# Patient Record
Sex: Male | Born: 1955 | Race: White | Hispanic: No | Smoking: Never smoker
Health system: Southern US, Community
[De-identification: ages and names within clinical notes are randomized; demographics above are authoritative.]

## PROBLEM LIST (undated history)

## (undated) DIAGNOSIS — F419 Anxiety disorder, unspecified: Secondary | ICD-10-CM

## (undated) DIAGNOSIS — E119 Type 2 diabetes mellitus without complications: Secondary | ICD-10-CM

## (undated) DIAGNOSIS — E785 Hyperlipidemia, unspecified: Secondary | ICD-10-CM

## (undated) DIAGNOSIS — G4730 Sleep apnea, unspecified: Secondary | ICD-10-CM

## (undated) DIAGNOSIS — I10 Essential (primary) hypertension: Secondary | ICD-10-CM

## (undated) DIAGNOSIS — T7840XA Allergy, unspecified, initial encounter: Secondary | ICD-10-CM

## (undated) DIAGNOSIS — M1990 Unspecified osteoarthritis, unspecified site: Secondary | ICD-10-CM

## (undated) DIAGNOSIS — F32A Depression, unspecified: Secondary | ICD-10-CM

## (undated) DIAGNOSIS — K219 Gastro-esophageal reflux disease without esophagitis: Secondary | ICD-10-CM

## (undated) DIAGNOSIS — J45909 Unspecified asthma, uncomplicated: Secondary | ICD-10-CM

## (undated) DIAGNOSIS — C801 Malignant (primary) neoplasm, unspecified: Secondary | ICD-10-CM

## (undated) DIAGNOSIS — F329 Major depressive disorder, single episode, unspecified: Secondary | ICD-10-CM

## (undated) HISTORY — DX: Essential (primary) hypertension: I10

## (undated) HISTORY — DX: Anxiety disorder, unspecified: F41.9

## (undated) HISTORY — DX: Major depressive disorder, single episode, unspecified: F32.9

## (undated) HISTORY — DX: Unspecified osteoarthritis, unspecified site: M19.90

## (undated) HISTORY — DX: Malignant (primary) neoplasm, unspecified: C80.1

## (undated) HISTORY — DX: Allergy, unspecified, initial encounter: T78.40XA

## (undated) HISTORY — PX: OTHER SURGICAL HISTORY: SHX169

## (undated) HISTORY — DX: Sleep apnea, unspecified: G47.30

## (undated) HISTORY — DX: Type 2 diabetes mellitus without complications: E11.9

## (undated) HISTORY — DX: Unspecified asthma, uncomplicated: J45.909

## (undated) HISTORY — DX: Hyperlipidemia, unspecified: E78.5

## (undated) HISTORY — DX: Gastro-esophageal reflux disease without esophagitis: K21.9

## (undated) HISTORY — PX: TRIGGER FINGER RELEASE: SHX641

## (undated) HISTORY — PX: KNEE ARTHROSCOPY: SHX127

## (undated) HISTORY — PX: CARPAL BONE EXCISION: SHX6468

## (undated) HISTORY — DX: Depression, unspecified: F32.A

---
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
## 2007-04-08 ENCOUNTER — Ambulatory Visit (HOSPITAL_BASED_OUTPATIENT_CLINIC_OR_DEPARTMENT_OTHER): Admission: RE | Admit: 2007-04-08 | Discharge: 2007-04-08 | Payer: Self-pay | Admitting: Orthopedic Surgery

## 2008-01-06 ENCOUNTER — Ambulatory Visit (HOSPITAL_BASED_OUTPATIENT_CLINIC_OR_DEPARTMENT_OTHER): Admission: RE | Admit: 2008-01-06 | Discharge: 2008-01-06 | Payer: Self-pay | Admitting: Orthopedic Surgery

## 2008-01-28 ENCOUNTER — Encounter: Admission: RE | Admit: 2008-01-28 | Discharge: 2008-01-28 | Payer: Self-pay | Admitting: Orthopedic Surgery

## 2009-04-19 ENCOUNTER — Encounter (INDEPENDENT_AMBULATORY_CARE_PROVIDER_SITE_OTHER): Payer: Self-pay | Admitting: Orthopedic Surgery

## 2009-04-19 ENCOUNTER — Ambulatory Visit (HOSPITAL_BASED_OUTPATIENT_CLINIC_OR_DEPARTMENT_OTHER): Admission: RE | Admit: 2009-04-19 | Discharge: 2009-04-19 | Payer: Self-pay | Admitting: Orthopedic Surgery

## 2009-06-30 ENCOUNTER — Encounter: Admission: RE | Admit: 2009-06-30 | Discharge: 2009-06-30 | Payer: Self-pay | Admitting: Orthopedic Surgery

## 2009-08-11 ENCOUNTER — Ambulatory Visit (HOSPITAL_BASED_OUTPATIENT_CLINIC_OR_DEPARTMENT_OTHER): Admission: RE | Admit: 2009-08-11 | Discharge: 2009-08-11 | Payer: Self-pay | Admitting: Orthopedic Surgery

## 2010-10-16 LAB — POCT I-STAT, CHEM 8
Calcium, Ion: 1.14 mmol/L (ref 1.12–1.32)
Glucose, Bld: 104 mg/dL — ABNORMAL HIGH (ref 70–99)
HCT: 44 % (ref 39.0–52.0)
Hemoglobin: 15 g/dL (ref 13.0–17.0)
Potassium: 4 mEq/L (ref 3.5–5.1)

## 2010-10-16 LAB — GLUCOSE, CAPILLARY: Glucose-Capillary: 92 mg/dL (ref 70–99)

## 2010-11-04 LAB — BASIC METABOLIC PANEL
CO2: 26 mEq/L (ref 19–32)
Calcium: 9.5 mg/dL (ref 8.4–10.5)
Creatinine, Ser: 0.71 mg/dL (ref 0.4–1.5)
Glucose, Bld: 107 mg/dL — ABNORMAL HIGH (ref 70–99)

## 2010-12-13 NOTE — Op Note (Signed)
NAMETOMMY, GOOSTREE NO.:  [id number]   MEDICAL RECORD NO.:  [id number]          PATIENT TYPE:  AMB   LOCATION:  DSC                          FACILITY:  [facility]   PHYSICIAN:  Feliberto Gottron. Turner Daniels, M.D.   DATE OF BIRTH:  01/28/1956   DATE OF PROCEDURE:  04/08/2007  DATE OF DISCHARGE:                               OPERATIVE REPORT   PREOPERATIVE DIAGNOSIS:  1. Right carpal tunnel syndrome.  2. Right ring finger trigger.  3. Metacarpophalangeal joint arthritis, right.   POSTOPERATIVE DIAGNOSIS:  1. Right carpal tunnel syndrome.  2. Right ring finger trigger.  3. Metacarpophalangeal joint arthritis, right.   PROCEDURE:  1. Right carpal tunnel release.  2. Right ring finger trigger release.  3. Injection of cortisone into the base of the right thumb      metacarpophalangeal joint.   SURGEON:  Feliberto Gottron.  Turner Daniels, MD   FIRST ASSISTANT:  Laural Benes. Jannet Mantis.   ANESTHETIC:  Local with no sedation.   ESTIMATED BLOOD LOSS:  Minimal.   FLUID REPLACEMENT:  500 mL of crystalloid.   TOURNIQUET TIME:  20 minutes.   INDICATIONS FOR PROCEDURE:  A 55 year old man with severe right carpal  tunnel syndrome by EMG and MCV, a right ring finger trigger, and MCP  joint arthritis by x-ray.  This has failed conservative treatment; and  he desires elective right carpal tunnel release although that is  actually semi-urgent, because it is a severe right carpal tunnel  syndrome and elective trigger finger release, right ring finger and  injection of cortisone into the base of the MCP joint.   DESCRIPTION OF PROCEDURE:  The patient identified by armband and taken  to the operating room at the [facility] where the  appropriate anesthetic monitors were attached; and a simple IV line was  initiated.  A tourniquet was applied to the right forearm; and prior to  the prep and drape I went ahead and painted the hand with Betadine; and  then using a 10 mL syringe with a 50/50  mixture of 2% Xylocaine and 1/2%  Marcaine.  The local anesthesia was placed along the tract of the carpal  tunnel, and the right ring finger trigger.  The right hand was then  prepped and draped in the usual sterile fashion from the fingertips to  the tourniquet.  The limb wrapped with an Esmarch bandage, tourniquet  inflated to 300 mmHg and we began the procedure, by making a  longitudinal incision starting out at the base of the wrist flexion  crease; and going distally just to the ulnar side of the thenar crease  for a distance of about 3 or 4 cm.   We cut through the skin and subcutaneous tissue down to the palmar  fascia distally and made a small incision in the palmar fascia.  We  placed a Therapist, nutritional into the carpal tunnel, and then cut through the  transverse carpal ligament, performing the carpal tunnel release.  This  was extended up into the forearm with the black-handled scissors  completely exposing the  carpal tunnel.  There no ganglia or masses in  the carpal tunnel.  The median nerve was examined; and found to be  compressed as it passed under the transverse carpal ligament; and  pressure was off the nerve at this point.  We then directed our  attention to the ring finger trigger and made a 1.5 cm transverse  incision just distal to the MCP flexion crease of the ring finger  through the skin into the subcutaneous tissue; and with the tenotomy  scissors spread vertically exposing the A1 pulley.  Using an awl and  Senn retractors the pulley was completely exposed.  The neurovascular  bundles were protected and we removed a rectangular section of the A1  pulley, exposing the nodule on the tendon.  The patient then actively  flexed and extended the finger; and it no longer triggered.   These wounds were then washed out with normal saline solution; and on  the carpal tunnel small bleeders were cauterized with the bipolar.  We  then took a 3 mL syringe with 2 mL of  Celestone in it and a 0.625 x 25  needle and injected the MCP joint at the base of the thumb.  At this  point, the wounds were closed with running 5-0 nylon suture, dressing of  Xeroform, 4x4 dressing, sponges, 2-inch Webril, and an Ace wrap applied.  Tourniquet let down.  The patient was then taken to the recovery room  without difficulty.      Feliberto Gottron. Turner Daniels, M.D.  Electronically Signed     FJR/MEDQ  D:  04/08/2007  T:  04/08/2007  Job:  [id number]

## 2010-12-13 NOTE — Op Note (Signed)
[name], [name] NO.:  [id number]   MEDICAL RECORD NO.:  [id number]          PATIENT TYPE:  AMB   LOCATION:  DSC                          FACILITY:  [facility]   PHYSICIAN:  Steve Williams, M.D.   DATE OF BIRTH:  May 30, 1956   DATE OF PROCEDURE:  01/06/2008  DATE OF DISCHARGE:                               OPERATIVE REPORT   PREOPERATIVE DIAGNOSIS:  Left carpal tunnel syndrome.   POSTOPERATIVE DIAGNOSIS:  Left carpal tunnel syndrome.   PROCEDURE:  Left carpal tunnel release.   ANESTHETIC:  Local.   SURGEON:  Steve Gottron. Turner Daniels, MD.   FIRST ASSISTANT:  Shirl Harris, PA-C.   ESTIMATED BLOOD LOSS:  Minimal.   TOURNIQUET TIME:  About 6 minutes.   INDICATIONS FOR PROCEDURE:  A 55 year old man who underwent a successful  right carpal tunnel release last year, had severe left carpal tunnel  syndrome and desires same for his left side.  Risks and benefits of the  surgery are well known to the patient.   DESCRIPTION OF PROCEDURE:  Per the patient's request, he was taken to  the operating room 3 at [facility] and a local anesthetic  only, a 50/50 mixture of 0.5% Marcaine and 2% Xylocaine was then  administered, a total of 10 mL, going transversely across the wrist and  then just to the ulnar side of the thenar crease and the subcutaneous  tissue.  The left upper extremity was then prepped and draped in usual  sterile fashion from the fingertips to a forearm tourniquet.  Limb was  wrapped with an Esmarch bandage.  Tourniquet was inflated to 250 mmHg  and began the procedure by making a palmar longitudinal incision  starting at the wrist flexion crease going distally for 3.5 to 4 cm,  just to the ulnar side of the thenar crease by a couple of millimeters.  We cut through the skin and subcutaneous tissue, kept traction on the  skin medially and laterally with Senn retractors and cut down to the  transverse palmar fascia, which was incised and taken  proximally cutting  under tension into the carpal tunnel.  A Freer elevator was then placed  on the palmar aspect of the carpal tunnel, and we completed the carpal  tunnel release cutting down on the [place].  This was extended up in the  forearm fascia using black handled scissors completing the carpal tunnel  release.  We then probed the carpal tunnel with our fingers to make sure  there were no ganglia or masses, visualized the tendons and movement,  irrigated the wound out with normal saline solution, and then closed  with skin only with running subcuticular 4-0 Monocryl suture.  When we  ran it, we ran it from proximal to distal and  then back from distal to proximal, all with one suture to attain  basically a double closure.  A dressing of Xeroform, 4x4 dressing,  sponges, Webril, and Ace wrap was then applied and by the way the  tourniquet was let down prior to starting the closure but after the  irrigation.      Steve Williams, M.D.  Electronically Signed     FJR/MEDQ  D:  01/06/2008  T:  01/07/2008  Job:  [id number]

## 2011-01-09 ENCOUNTER — Other Ambulatory Visit: Payer: Self-pay | Admitting: Ophthalmology

## 2011-05-10 ENCOUNTER — Encounter: Payer: Self-pay | Admitting: Internal Medicine

## 2011-05-12 LAB — BASIC METABOLIC PANEL
BUN: 12
Calcium: 9.4
Creatinine, Ser: 0.74
GFR calc non Af Amer: >60
Glucose, Bld: 157 — ABNORMAL HIGH

## 2011-06-02 ENCOUNTER — Other Ambulatory Visit: Payer: Self-pay | Admitting: Internal Medicine

## 2013-02-12 ENCOUNTER — Encounter (INDEPENDENT_AMBULATORY_CARE_PROVIDER_SITE_OTHER): Payer: Self-pay | Admitting: *Deleted

## 2013-07-08 ENCOUNTER — Encounter (INDEPENDENT_AMBULATORY_CARE_PROVIDER_SITE_OTHER): Payer: Self-pay | Admitting: *Deleted

## 2015-06-21 ENCOUNTER — Encounter: Payer: Self-pay | Admitting: Family Medicine

## 2015-06-21 ENCOUNTER — Ambulatory Visit (INDEPENDENT_AMBULATORY_CARE_PROVIDER_SITE_OTHER): Payer: BC Managed Care – PPO | Admitting: Family Medicine

## 2015-06-21 VITALS — BP 150/80 | HR 66 | Temp 97.8°F | Ht 66.0 in | Wt 286.4 lb

## 2015-06-21 DIAGNOSIS — E785 Hyperlipidemia, unspecified: Secondary | ICD-10-CM | POA: Insufficient documentation

## 2015-06-21 DIAGNOSIS — E291 Testicular hypofunction: Secondary | ICD-10-CM | POA: Diagnosis not present

## 2015-06-21 DIAGNOSIS — E119 Type 2 diabetes mellitus without complications: Secondary | ICD-10-CM | POA: Insufficient documentation

## 2015-06-21 DIAGNOSIS — E1159 Type 2 diabetes mellitus with other circulatory complications: Secondary | ICD-10-CM | POA: Insufficient documentation

## 2015-06-21 DIAGNOSIS — F39 Unspecified mood [affective] disorder: Secondary | ICD-10-CM | POA: Diagnosis not present

## 2015-06-21 DIAGNOSIS — I10 Essential (primary) hypertension: Secondary | ICD-10-CM | POA: Diagnosis not present

## 2015-06-21 DIAGNOSIS — J309 Allergic rhinitis, unspecified: Secondary | ICD-10-CM | POA: Insufficient documentation

## 2015-06-21 DIAGNOSIS — E1169 Type 2 diabetes mellitus with other specified complication: Secondary | ICD-10-CM | POA: Insufficient documentation

## 2015-06-21 MED ORDER — TESTOSTERONE CYPIONATE 200 MG/ML IM SOLN
200.0000 mg | INTRAMUSCULAR | Status: DC
  Administered 2015-06-21 – 2015-10-05 (×5): 200 mg via INTRAMUSCULAR

## 2015-06-21 MED ORDER — LOSARTAN POTASSIUM-HCTZ 100-25 MG PO TABS: 1.0000 | ORAL_TABLET | Freq: Every day | ORAL | Status: DC

## 2015-06-21 NOTE — Progress Notes (Addendum)
   HPI  Patient presents today to establish care and discuss chronic medical problems.  [facility]. He says his practice is turning more pediatric and so he was looking for a more long-term medical practice.  He denies any problems today. Denies shortness of breath, dyspnea, palpitations, leg edema. He tolerates metformin easily. He has had angioedema from lisinopril but has tolerated losartan easily.  He's been on testosterone for 5+ years and is well adjusted to his dose. 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    PMH: Smoking status noted His past medical, surgical, social, family history reviewed and updated in EMR. ROS: Per HPI  Objective: BP 150/80 mmHg  Pulse 66  Temp(Src) 97.8 F (36.6 C) (Oral)  Ht 5\' 6"  (1.676 m)  Wt 286 lb 6.4 oz (129.91 kg)  BMI 46.25 kg/m2 Gen: NAD, alert, cooperative with exam HEENT: NCAT, TMs normal bilaterally, nares clear, oropharynx clear CV: RRR, good S1/S2, no murmur Resp: CTABL, no wheezes, non-labored Abd: SNTND, BS present, no guarding or organomegaly Ext: No edema, warm Neuro: Alert and oriented, No gross deficits  Physical exam to cluster cells plus pulses, sensation intact monofilament throughout, no lesion  Assessment and plan:  # Hypertension Elevated today, he suspects there is a component of whitecoat hypertension with a new practice, he states last visit it was 1:30 over 70s. No red flags. Refilled Hyzaar He reports labs just 3-4 months ago were normal.  # Diabetes Well controlled with A1c of 6.8. Metformin 1 g in the morning and 500 at night, continue Optho notes coming as well.   # Hypogonadism Long-standing problem, stable dose on testosterone, continue Alden maintenance Foot exam performed today as above Routine labs next visit or according to his records A1c next visit  sounds well controlled     Laroy Apple, MD [facility] 06/21/2015, 1:45 PM

## 2015-06-21 NOTE — Addendum Note (Signed)
Addended by: Nigel Berthold C on: 06/21/2015 02:09 PM   Modules accepted: Orders

## 2015-06-21 NOTE — Patient Instructions (Signed)
Great to meet you!  Come back in 3 months, please have your records sent to [place].  Please have your ophthalmology records sent as well.  We are hear any time, please dont hesitate to come in with concerns.

## 2015-07-13 ENCOUNTER — Ambulatory Visit (INDEPENDENT_AMBULATORY_CARE_PROVIDER_SITE_OTHER): Payer: BC Managed Care – PPO | Admitting: Family Medicine

## 2015-07-13 ENCOUNTER — Encounter: Payer: Self-pay | Admitting: Family Medicine

## 2015-07-13 VITALS — BP 142/83 | HR 75 | Temp 97.7°F | Ht 66.0 in | Wt 279.0 lb

## 2015-07-13 DIAGNOSIS — H6982 Other specified disorders of Eustachian tube, left ear: Secondary | ICD-10-CM

## 2015-07-13 DIAGNOSIS — H6980 Other specified disorders of Eustachian tube, unspecified ear: Secondary | ICD-10-CM | POA: Insufficient documentation

## 2015-07-13 DIAGNOSIS — Z85820 Personal history of malignant melanoma of skin: Secondary | ICD-10-CM | POA: Diagnosis not present

## 2015-07-13 DIAGNOSIS — L820 Inflamed seborrheic keratosis: Secondary | ICD-10-CM | POA: Insufficient documentation

## 2015-07-13 DIAGNOSIS — E291 Testicular hypofunction: Secondary | ICD-10-CM

## 2015-07-13 DIAGNOSIS — H6990 Unspecified Eustachian tube disorder, unspecified ear: Secondary | ICD-10-CM | POA: Insufficient documentation

## 2015-07-13 NOTE — Patient Instructions (Addendum)
Great to see you again!  Try Ayr nasal saline gel for your nosebleeds, continue flonase I would expect your ear pressure to resolve, or at least improve, in the next 3-4 days.   If you get worse or do not get better as expected please come back.   Lets get a visit for a skin check alone, with a history of melanoma its a good idea to focus on your skin consciously once a year.

## 2015-07-13 NOTE — Progress Notes (Signed)
   HPI  Patient presents today here today with upper complaints and a painful mole.  Patient explains that for the last 3 days he's had left ear pressure, nasal congestion, and intermittent bloody nose. He uses Flonase daily at baseline for his sinuses. He has no sinus pressure or pain No cough, dyspnea, chest pain, malaise, or fever. Describes a mild bloody nose that bleeds for less than 5 minutes a few times a day over the last 3-4 days.  Painful mole Has a history of melanoma and several dysplastic nevi. He has a long-standing mole in his mid back that is very tender and bothering him a lot lately. This has been going on for a few weeks.   PMH: Smoking status noted ROS: Per HPI  Objective: BP 142/83 mmHg  Pulse 75  Temp(Src) 97.7 F (36.5 C) (Oral)  Ht 5\' 6"  (1.676 m)  Wt 279 lb (126.554 kg)  BMI 45.05 kg/m2 Gen: NAD, alert, cooperative with exam HEENT: NCAT, TMs WNL, Nares clear, oropharynx clear CV: RRR, good S1/S2, no murmur Resp: CTABL, no wheezes, non-labored Ext: No edema, warm Neuro: Alert and oriented, No gross deficits  Skin  Several seborrheic keratosis scattered across his back 1 seborrheic keratosis and mid back with an erythematous base that is tender to the touch  This wound was frozen using liquid nitrogen with 3 freeze/thaw cycles   Assessment and plan:  # inflamed seborrheic keratosis Cryotherapy today Refer to dermatology with history of melanoma or skin checks, he's also had a few dysplastic nevi since that time  # Eustachian tube dysfunction Consider nosebleeds I recommended starting nasal saline gel Continue Flonase Expect to resolve within a few days, if worsens or does not improve as expected return to clinic  Still waiting for records so we have requested them formally today.   Laroy Apple, MD [facility] 07/13/2015, 11:44 AM

## 2015-07-19 ENCOUNTER — Ambulatory Visit: Payer: BC Managed Care – PPO

## 2015-08-10 ENCOUNTER — Ambulatory Visit (INDEPENDENT_AMBULATORY_CARE_PROVIDER_SITE_OTHER): Payer: BC Managed Care – PPO | Admitting: *Deleted

## 2015-08-10 DIAGNOSIS — E291 Testicular hypofunction: Secondary | ICD-10-CM

## 2015-08-10 NOTE — Progress Notes (Signed)
Testosterone injection given and tolerated well.  

## 2015-08-10 NOTE — Patient Instructions (Signed)

## 2015-08-11 ENCOUNTER — Other Ambulatory Visit: Payer: Self-pay | Admitting: *Deleted

## 2015-08-11 MED ORDER — MONTELUKAST SODIUM 10 MG PO TABS: 10.0000 mg | ORAL_TABLET | Freq: Every day | ORAL | Status: DC

## 2015-08-14 ENCOUNTER — Encounter: Payer: Self-pay | Admitting: Family Medicine

## 2015-08-14 ENCOUNTER — Ambulatory Visit (INDEPENDENT_AMBULATORY_CARE_PROVIDER_SITE_OTHER): Payer: BC Managed Care – PPO | Admitting: Family Medicine

## 2015-08-14 VITALS — BP 133/79 | HR 73 | Temp 98.5°F | Ht 66.0 in | Wt 285.2 lb

## 2015-08-14 DIAGNOSIS — J309 Allergic rhinitis, unspecified: Secondary | ICD-10-CM | POA: Diagnosis not present

## 2015-08-14 MED ORDER — METHYLPREDNISOLONE ACETATE 80 MG/ML IJ SUSP
80.0000 mg | Freq: Once | INTRAMUSCULAR | Status: AC
  Administered 2015-08-14: 80 mg via INTRAMUSCULAR

## 2015-08-14 MED ORDER — PREDNISONE 20 MG PO TABS: ORAL_TABLET | ORAL | Status: DC

## 2015-08-14 NOTE — Progress Notes (Signed)
BP 133/79 mmHg  Pulse 73  Temp(Src) 98.5 F (36.9 C) (Oral)  Ht 5\' 6"  (1.676 m)  Wt 285 lb 3.2 oz (129.366 kg)  BMI 46.05 kg/m2   Subjective:    Patient ID: Steve Williams, male    DOB: 1956-07-20, 60 y.o.   MRN: [id number]  HPI: Steve Williams is a 60 y.o. male presenting on 08/14/2015 for Sinusitis   HPI Sinus congestion and nasal drainage Patient has been having 3 days of sinus congestion and nasal drainage and postnasal drainage and pressure in his ears. He denies any fevers or chills or shortness of breath or wheezing. He does have a history of allergies and asthma and has been taking nasal steroid and Singulair. He was coming in early because he wants to see if he can get this before it fully flares up and goes to his lungs with his asthma. He currently is not having any chest congestion or wheezing or major symptoms of asthma. He does have a rescue inhaler but has not had to use it just yet. 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  Relevant past medical, surgical, family and social history reviewed and updated as indicated. Interim medical history since our last visit reviewed. Allergies and medications reviewed and updated.  Review of Systems  Constitutional: Negative for fever and chills.  HENT: Positive for postnasal drip, rhinorrhea, sinus pressure, sneezing and sore throat. Negative for congestion, ear discharge, ear pain and voice change.   Eyes: Negative for pain, discharge, redness and visual disturbance.  Respiratory: Positive for cough. Negative for shortness of breath and wheezing.   Cardiovascular: Negative for chest pain and leg swelling.  Gastrointestinal: Negative for abdominal pain, diarrhea and constipation.  Genitourinary: Negative for difficulty urinating.  Musculoskeletal: Negative for  back pain and gait problem.  Skin: Negative for rash.  Neurological: Negative for syncope, light-headedness and headaches.  All other systems reviewed and are negative.   Per HPI unless specifically indicated above     Medication List       This list is accurate as of: 08/14/15  9:51 AM.  Always use your most recent med list.               aspirin 81 MG tablet  Take 81 mg by mouth daily.     fluticasone 50 MCG/ACT nasal spray  Commonly known as:  FLONASE  Place into both nostrils daily.     losartan-hydrochlorothiazide 100-25 MG tablet  Commonly known as:  HYZAAR  Take 1 tablet by mouth daily.     metFORMIN 500 MG 24 hr tablet  Commonly known as:  GLUCOPHAGE-XR     montelukast 10 MG tablet  Commonly known as:  SINGULAIR  Take 1 tablet (10 mg total) by mouth at bedtime.     naproxen 500 MG tablet  Commonly known as:  NAPROSYN     predniSONE 20 MG tablet  Commonly known as:  DELTASONE  2 po at same time daily for 5 days     sertraline 100 MG tablet  Commonly known as:  ZOLOFT     simvastatin 40 MG tablet  Commonly known as:  ZOCOR     testosterone cypionate 200 MG/ML injection  Commonly known as:  DEPOTESTOSTERONE CYPIONATE  Inject 200 mg into  the muscle every 14 (fourteen) days.           Objective:    BP 133/79 mmHg  Pulse 73  Temp(Src) 98.5 F (36.9 C) (Oral)  Ht 5\' 6"  (1.676 m)  Wt 285 lb 3.2 oz (129.366 kg)  BMI 46.05 kg/m2  Wt Readings from Last 3 Encounters:  08/14/15 285 lb 3.2 oz (129.366 kg)  07/13/15 279 lb (126.554 kg)  06/21/15 286 lb 6.4 oz (129.91 kg)    Physical Exam  Constitutional: He is oriented to person, place, and time. He appears well-developed and well-nourished. No distress.  HENT:  Right Ear: Tympanic membrane, external ear and ear canal normal.  Left Ear: Tympanic membrane, external ear and ear canal normal.  Nose: Mucosal edema and rhinorrhea present. No sinus tenderness. No epistaxis. Right sinus exhibits maxillary  sinus tenderness. Right sinus exhibits no frontal sinus tenderness. Left sinus exhibits maxillary sinus tenderness. Left sinus exhibits no frontal sinus tenderness.  Mouth/Throat: Uvula is midline and mucous membranes are normal. Posterior oropharyngeal edema and posterior oropharyngeal erythema present. No oropharyngeal exudate or tonsillar abscesses.  Eyes: Conjunctivae and EOM are normal. Pupils are equal, round, and reactive to light. Right eye exhibits no discharge. No scleral icterus.  Cardiovascular: Normal rate, regular rhythm, normal heart sounds and intact distal pulses.   No murmur heard. Pulmonary/Chest: Effort normal and breath sounds normal. No respiratory distress. He has no wheezes.  Abdominal: He exhibits no distension.  Musculoskeletal: Normal range of motion. He exhibits no edema.  Neurological: He is alert and oriented to person, place, and time. Coordination normal.  Skin: Skin is warm and dry. No rash noted. He is not diaphoretic.  Psychiatric: He has a normal mood and affect. His behavior is normal.  Vitals reviewed.     Assessment & Plan:   Problem List Items Addressed This Visit      Respiratory   Allergic rhinitis - Primary    Patient has a history of allergies and is already on nasal steroid and antihistamine, will add steroid, if not improved by early next week may need antibiotic      Relevant Medications   methylPREDNISolone acetate (DEPO-MEDROL) injection 80 mg (Start on 08/14/2015 10:00 AM)   predniSONE (DELTASONE) 20 MG tablet       Follow up plan: Return if symptoms worsen or fail to improve.  Counseling provided for all of the vaccine components No orders of the defined types were placed in this encounter.    Caryl Pina, MD [facility] 08/14/2015, 9:51 AM

## 2015-08-14 NOTE — Assessment & Plan Note (Signed)
Patient has a history of allergies and is already on nasal steroid and antihistamine, will add steroid, if not improved by early next week may need antibiotic

## 2015-09-10 ENCOUNTER — Ambulatory Visit (INDEPENDENT_AMBULATORY_CARE_PROVIDER_SITE_OTHER): Payer: BC Managed Care – PPO

## 2015-09-10 DIAGNOSIS — E291 Testicular hypofunction: Secondary | ICD-10-CM | POA: Diagnosis not present

## 2015-09-16 ENCOUNTER — Other Ambulatory Visit: Payer: Self-pay

## 2015-09-16 MED ORDER — LOSARTAN POTASSIUM-HCTZ 100-25 MG PO TABS: 1.0000 | ORAL_TABLET | Freq: Every day | ORAL | Status: DC

## 2015-09-28 ENCOUNTER — Other Ambulatory Visit: Payer: BC Managed Care – PPO

## 2015-09-28 DIAGNOSIS — I10 Essential (primary) hypertension: Secondary | ICD-10-CM

## 2015-09-28 DIAGNOSIS — E291 Testicular hypofunction: Secondary | ICD-10-CM

## 2015-09-28 DIAGNOSIS — E785 Hyperlipidemia, unspecified: Secondary | ICD-10-CM

## 2015-09-29 LAB — CMP14+EGFR
ALBUMIN: 4.3 g/dL (ref 3.6–4.8)
ALK PHOS: 68 IU/L (ref 39–117)
ALT: 22 IU/L (ref 0–44)
AST: 16 IU/L (ref 0–40)
Albumin/Globulin Ratio: 2.3 (ref 1.1–2.5)
BILIRUBIN TOTAL: 0.5 mg/dL (ref 0.0–1.2)
BUN / CREAT RATIO: 20 (ref 10–22)
BUN: 15 mg/dL (ref 8–27)
CHLORIDE: 97 mmol/L (ref 96–106)
CO2: 23 mmol/L (ref 18–29)
CREATININE: 0.75 mg/dL — AB (ref 0.76–1.27)
Calcium: 9.1 mg/dL (ref 8.6–10.2)
GFR calc Af Amer: 114 mL/min/{1.73_m2} (ref >59)
GFR calc non Af Amer: 99 mL/min/{1.73_m2} (ref >59)
GLOBULIN, TOTAL: 1.9 g/dL (ref 1.5–4.5)
GLUCOSE: 123 mg/dL — AB (ref 65–99)
Potassium: 4 mmol/L (ref 3.5–5.2)
SODIUM: 138 mmol/L (ref 134–144)
Total Protein: 6.2 g/dL (ref 6.0–8.5)

## 2015-09-29 LAB — CBC WITH DIFFERENTIAL/PLATELET
Basophils Absolute: 0 10*3/uL (ref 0.0–0.2)
Basos: 1 %
EOS (ABSOLUTE): 0.1 10*3/uL (ref 0.0–0.4)
Eos: 2 %
HEMATOCRIT: 44.4 % (ref 37.5–51.0)
Hemoglobin: 16.2 g/dL (ref 12.6–17.7)
IMMATURE GRANULOCYTES: 1 %
Immature Grans (Abs): 0.1 10*3/uL (ref 0.0–0.1)
LYMPHS ABS: 1.9 10*3/uL (ref 0.7–3.1)
Lymphs: 26 %
MCH: 31.6 pg (ref 26.6–33.0)
MCHC: 36.5 g/dL — AB (ref 31.5–35.7)
MCV: 87 fL (ref 79–97)
MONOS ABS: 0.5 10*3/uL (ref 0.1–0.9)
Monocytes: 7 %
NEUTROS PCT: 63 %
Neutrophils Absolute: 4.7 10*3/uL (ref 1.4–7.0)
Platelets: 101 10*3/uL — ABNORMAL LOW (ref 150–379)
RBC: 5.12 x10E6/uL (ref 4.14–5.80)
RDW: 14 % (ref 12.3–15.4)
WBC: 7.3 10*3/uL (ref 3.4–10.8)

## 2015-09-29 LAB — LIPID PANEL
CHOLESTEROL TOTAL: 138 mg/dL (ref 100–199)
Chol/HDL Ratio: 3.3 ratio units (ref 0.0–5.0)
HDL: 42 mg/dL (ref >39)
LDL CALC: 50 mg/dL (ref 0–99)
TRIGLYCERIDES: 228 mg/dL — AB (ref 0–149)
VLDL Cholesterol Cal: 46 mg/dL — ABNORMAL HIGH (ref 5–40)

## 2015-09-29 LAB — TSH: TSH: 0.656 u[IU]/mL (ref 0.450–4.500)

## 2015-09-29 LAB — TESTOSTERONE,FREE AND TOTAL
TESTOSTERONE FREE: 4.3 pg/mL — AB (ref 6.6–18.1)
TESTOSTERONE: 89 ng/dL — AB (ref 348–1197)

## 2015-10-05 ENCOUNTER — Encounter: Payer: Self-pay | Admitting: Family Medicine

## 2015-10-05 ENCOUNTER — Ambulatory Visit (INDEPENDENT_AMBULATORY_CARE_PROVIDER_SITE_OTHER): Payer: BC Managed Care – PPO | Admitting: Family Medicine

## 2015-10-05 VITALS — BP 141/73 | HR 67 | Temp 97.1°F | Ht 66.0 in | Wt 284.6 lb

## 2015-10-05 DIAGNOSIS — E119 Type 2 diabetes mellitus without complications: Secondary | ICD-10-CM

## 2015-10-05 DIAGNOSIS — E291 Testicular hypofunction: Secondary | ICD-10-CM

## 2015-10-05 LAB — BAYER DCA HB A1C WAIVED: HB A1C (BAYER DCA - WAIVED): 6.3 % (ref <7.0)

## 2015-10-05 MED ORDER — TESTOSTERONE CYPIONATE 200 MG/ML IM SOLN: 400.0000 mg | INTRAMUSCULAR | Status: DC

## 2015-10-05 NOTE — Patient Instructions (Signed)
Great to see you!!  Come back in 3 months  We will call with your A1C

## 2015-10-05 NOTE — Progress Notes (Signed)
   HPI  Patient presents today  here for follow-up diabetes, hypertension, and hypogonadism.  Diabetes He's not checking his blood sugars He is watching his diet He exercises regularly at work, however no formal exercise No problems with metformin.  Hypertension No chest pain, dyspnea, palpitations, leg edema Good medication compliance.  Hypogonadism Getting testosterone as prescribed Does have intermittent issues with erectile dysfunction, energy is okay   PMH: Smoking status noted ROS: Per HPI  Objective: BP 141/73 mmHg  Pulse 67  Temp(Src) 97.1 F (36.2 C) (Oral)  Ht 5\' 6"  (1.676 m)  Wt 284 lb 9.6 oz (129.094 kg)  BMI 45.96 kg/m2 Gen: NAD, alert, cooperative with exam HEENT: NCAT, nares clear, TMs normal bilaterally  CV: RRR, good S1/S2, no murmur Resp: CTABL, no wheezes, non-labored Ext: No edema, warm Neuro: Alert and oriented, No gross deficits  Assessment and plan:  # Type 2 diabetes A1c 6.3, reasonable control with only metformin Continue metformin Encouraged diet and exercise  # Hypertension Borderline today, continue current medications including losartan and hydrochlorothiazide Labs review, normal renal function  # Hypogonadism Testosterone very low despite replacement Increase to 400 mg every month testosterone injection given today   Orders Placed This Encounter  Procedures  . Bayer DCA Hb A1c Waived    Meds ordered this encounter  Medications  . testosterone cypionate (DEPOTESTOSTERONE CYPIONATE) 200 MG/ML injection    Sig: Inject 2 mLs (400 mg total) into the muscle every 28 (twenty-eight) days.    Dispense:  10 mL    Refill:  [place], MD [facility] 10/05/2015, 8:28 AM

## 2015-10-08 ENCOUNTER — Ambulatory Visit: Payer: BC Managed Care – PPO

## 2015-10-11 ENCOUNTER — Ambulatory Visit: Payer: BC Managed Care – PPO

## 2015-10-15 ENCOUNTER — Telehealth: Payer: Self-pay

## 2015-10-15 DIAGNOSIS — E291 Testicular hypofunction: Secondary | ICD-10-CM

## 2015-10-15 NOTE — Telephone Encounter (Signed)
Has established Dx, but will re-check  Will repeat 8 am [place], MD [facility] 10/15/2015, 5:26 PM

## 2015-10-15 NOTE — Telephone Encounter (Signed)
Insurance denied Testosterone IM injection due to the patient not having two low Testosterone levels

## 2015-10-18 ENCOUNTER — Encounter: Payer: Self-pay | Admitting: Family Medicine

## 2015-10-21 ENCOUNTER — Telehealth: Payer: Self-pay

## 2015-10-21 NOTE — Telephone Encounter (Signed)
lmtcb

## 2015-10-22 ENCOUNTER — Ambulatory Visit: Payer: BC Managed Care – PPO

## 2015-10-22 DIAGNOSIS — E291 Testicular hypofunction: Secondary | ICD-10-CM

## 2015-10-23 LAB — TESTOSTERONE: Testosterone: 109 ng/dL — ABNORMAL LOW (ref 348–1197)

## 2015-10-26 ENCOUNTER — Telehealth: Payer: Self-pay

## 2015-10-26 NOTE — Telephone Encounter (Signed)
Detailed message left for patient.

## 2015-10-26 NOTE — Telephone Encounter (Signed)
Appreciate the Virgie Dad, MD [facility] 10/26/2015, 12:57 PM

## 2015-10-26 NOTE — Telephone Encounter (Signed)
insurAnce prior authorized Testosterone

## 2015-11-05 ENCOUNTER — Ambulatory Visit (INDEPENDENT_AMBULATORY_CARE_PROVIDER_SITE_OTHER): Payer: BC Managed Care – PPO | Admitting: *Deleted

## 2015-11-05 DIAGNOSIS — E291 Testicular hypofunction: Secondary | ICD-10-CM | POA: Diagnosis not present

## 2015-11-05 MED ORDER — TESTOSTERONE CYPIONATE 200 MG/ML IM SOLN
400.0000 mg | INTRAMUSCULAR | Status: DC
  Administered 2015-11-05 – 2016-05-08 (×7): 400 mg via INTRAMUSCULAR

## 2015-11-05 NOTE — Patient Instructions (Signed)

## 2015-11-05 NOTE — Progress Notes (Signed)
Testosterone injection given and patient tolerated well.  

## 2015-11-10 ENCOUNTER — Other Ambulatory Visit: Payer: Self-pay | Admitting: *Deleted

## 2015-11-11 MED ORDER — SIMVASTATIN 40 MG PO TABS: 40.0000 mg | ORAL_TABLET | Freq: Every day | ORAL | Status: DC

## 2015-12-06 ENCOUNTER — Ambulatory Visit (INDEPENDENT_AMBULATORY_CARE_PROVIDER_SITE_OTHER): Payer: BC Managed Care – PPO | Admitting: *Deleted

## 2015-12-06 DIAGNOSIS — E291 Testicular hypofunction: Secondary | ICD-10-CM

## 2015-12-06 NOTE — Progress Notes (Signed)
Pt given 400mg  testosterone injection IM RUOQ and tolerated well.

## 2015-12-06 NOTE — Patient Instructions (Signed)

## 2015-12-16 ENCOUNTER — Other Ambulatory Visit: Payer: Self-pay | Admitting: Family Medicine

## 2015-12-28 ENCOUNTER — Other Ambulatory Visit: Payer: BC Managed Care – PPO

## 2015-12-28 DIAGNOSIS — I10 Essential (primary) hypertension: Secondary | ICD-10-CM

## 2015-12-28 DIAGNOSIS — I159 Secondary hypertension, unspecified: Secondary | ICD-10-CM

## 2015-12-28 LAB — CBC WITH DIFFERENTIAL/PLATELET
BASOS ABS: 0.1 10*3/uL (ref 0.0–0.2)
BASOS: 1 %
EOS (ABSOLUTE): 0.2 10*3/uL (ref 0.0–0.4)
Eos: 2 %
Hematocrit: 47.6 % (ref 37.5–51.0)
Hemoglobin: 16.2 g/dL (ref 12.6–17.7)
IMMATURE GRANULOCYTES: 1 %
Immature Grans (Abs): 0 10*3/uL (ref 0.0–0.1)
Lymphocytes Absolute: 2.1 10*3/uL (ref 0.7–3.1)
Lymphs: 28 %
MCH: 30.8 pg (ref 26.6–33.0)
MCHC: 34 g/dL (ref 31.5–35.7)
MCV: 91 fL (ref 79–97)
MONOS ABS: 0.6 10*3/uL (ref 0.1–0.9)
Monocytes: 8 %
NEUTROS PCT: 60 %
Neutrophils Absolute: 4.5 10*3/uL (ref 1.4–7.0)
PLATELETS: 102 10*3/uL — AB (ref 150–379)
RBC: 5.26 x10E6/uL (ref 4.14–5.80)
RDW: 13.9 % (ref 12.3–15.4)
WBC: 7.4 10*3/uL (ref 3.4–10.8)

## 2015-12-28 LAB — CMP14+EGFR
ALBUMIN: 4.4 g/dL (ref 3.6–4.8)
ALK PHOS: 64 IU/L (ref 39–117)
ALT: 19 IU/L (ref 0–44)
AST: 20 IU/L (ref 0–40)
Albumin/Globulin Ratio: 3.1 — ABNORMAL HIGH (ref 1.2–2.2)
BILIRUBIN TOTAL: 0.5 mg/dL (ref 0.0–1.2)
BUN/Creatinine Ratio: 24 (ref 10–24)
BUN: 16 mg/dL (ref 8–27)
CO2: 23 mmol/L (ref 18–29)
CREATININE: 0.68 mg/dL — AB (ref 0.76–1.27)
Calcium: 9.3 mg/dL (ref 8.6–10.2)
Chloride: 98 mmol/L (ref 96–106)
GFR calc Af Amer: 119 mL/min/{1.73_m2} (ref >59)
GFR calc non Af Amer: 103 mL/min/{1.73_m2} (ref >59)
GLUCOSE: 121 mg/dL — AB (ref 65–99)
Globulin, Total: 1.4 g/dL — ABNORMAL LOW (ref 1.5–4.5)
Potassium: 4 mmol/L (ref 3.5–5.2)
SODIUM: 139 mmol/L (ref 134–144)
Total Protein: 5.8 g/dL — ABNORMAL LOW (ref 6.0–8.5)

## 2016-01-06 ENCOUNTER — Encounter: Payer: Self-pay | Admitting: Family Medicine

## 2016-01-06 ENCOUNTER — Ambulatory Visit (INDEPENDENT_AMBULATORY_CARE_PROVIDER_SITE_OTHER): Payer: BC Managed Care – PPO | Admitting: Family Medicine

## 2016-01-06 VITALS — BP 137/73 | HR 60 | Temp 97.1°F | Ht 66.0 in | Wt 288.8 lb

## 2016-01-06 DIAGNOSIS — M19041 Primary osteoarthritis, right hand: Secondary | ICD-10-CM

## 2016-01-06 DIAGNOSIS — E291 Testicular hypofunction: Secondary | ICD-10-CM | POA: Diagnosis not present

## 2016-01-06 DIAGNOSIS — E119 Type 2 diabetes mellitus without complications: Secondary | ICD-10-CM

## 2016-01-06 DIAGNOSIS — I10 Essential (primary) hypertension: Secondary | ICD-10-CM | POA: Diagnosis not present

## 2016-01-06 DIAGNOSIS — M19042 Primary osteoarthritis, left hand: Secondary | ICD-10-CM | POA: Diagnosis not present

## 2016-01-06 DIAGNOSIS — M19049 Primary osteoarthritis, unspecified hand: Secondary | ICD-10-CM | POA: Insufficient documentation

## 2016-01-06 LAB — BAYER DCA HB A1C WAIVED: HB A1C (BAYER DCA - WAIVED): 6.1 % (ref <7.0)

## 2016-01-06 NOTE — Patient Instructions (Addendum)
Great to see you!  You are due for an eye exam, I have written a referral, please have them send results to [place].

## 2016-01-06 NOTE — Progress Notes (Signed)
   HPI  Patient presents today follow-up.  Hypertension No problems with medications, good compliance. No chest pain, dyspnea, palpitations, leg edema.  Diabetes Not really watching his diet, get metformin compliance Planning to get an eye exam Note neuropathy.  Hand osteoarthritis Long-standing bilateral hand pain, previously seen by Dr. Renetta Chalk at [facility] ortho   PMH: Smoking status noted ROS: Per HPI  Objective: BP 150/82 mmHg  Pulse 60  Temp(Src) 97.1 F (36.2 C) (Oral)  Ht 5\' 6"  (1.676 m)  Wt 288 lb 12.8 oz (130.999 kg)  BMI 46.64 kg/m2 Gen: NAD, alert, cooperative with exam HEENT: NCAT CV: RRR, good S1/S2, no murmur Resp: CTABL, no wheezes, non-labored Ext: No edema, warm Neuro: Alert and oriented, No gross deficits  Diabetic Foot Exam - Simple   Simple Foot Form  Visual Inspection  No deformities, no ulcerations, no other skin breakdown bilaterally:  Yes  Sensation Testing  Intact to touch and monofilament testing bilaterally:  Yes  Pulse Check  Posterior Tibialis and Dorsalis pulse intact bilaterally:  Yes  Comments       Assessment and plan:  # Type 2 diabetes A1c pending Continue metformin   # Hypertension Elevated today, although borderline Continue losartan, hydrochlorothiazide  # Hypogonadism Testosterone injection given today Recheck levels in 6 months after increase  # Hand osteoarthritis Discussed decreasing NSAIDs burden, try 1 Aleve twice daily If he continues Naprosyn would recommend adding in Pepcid or Zantac twice daily with the pill Also trial ofTylenol     Orders Placed This Encounter  Procedures  . Bayer DCA Hb A1c Waived    No orders of the defined types were placed in this encounter.    Laroy Apple, MD [facility] 01/06/2016, 8:21 AM

## 2016-01-27 ENCOUNTER — Encounter: Payer: Self-pay | Admitting: Family Medicine

## 2016-01-27 ENCOUNTER — Telehealth: Payer: Self-pay | Admitting: Family Medicine

## 2016-01-27 DIAGNOSIS — Z9989 Dependence on other enabling machines and devices: Secondary | ICD-10-CM

## 2016-01-27 DIAGNOSIS — G4733 Obstructive sleep apnea (adult) (pediatric): Secondary | ICD-10-CM | POA: Insufficient documentation

## 2016-01-27 LAB — HM DIABETES EYE EXAM

## 2016-01-27 NOTE — Telephone Encounter (Signed)
Patient called very upset and discussed with our nurses that he needed CPAP Supplies.  He has obstructive sleep apnea, using CPAP for several years.  He later called back and apologized.   Laroy Apple, MD [facility] 01/27/2016, 2:29 PM

## 2016-02-03 ENCOUNTER — Ambulatory Visit (INDEPENDENT_AMBULATORY_CARE_PROVIDER_SITE_OTHER): Payer: BC Managed Care – PPO | Admitting: Family Medicine

## 2016-02-03 DIAGNOSIS — E291 Testicular hypofunction: Secondary | ICD-10-CM

## 2016-02-03 NOTE — Progress Notes (Signed)
Pt given testosterone inj Tolerated well 

## 2016-02-08 ENCOUNTER — Other Ambulatory Visit: Payer: Self-pay | Admitting: *Deleted

## 2016-02-10 ENCOUNTER — Other Ambulatory Visit: Payer: Self-pay | Admitting: *Deleted

## 2016-02-10 MED ORDER — SERTRALINE HCL 100 MG PO TABS
100.0000 mg | ORAL_TABLET | Freq: Every day | ORAL | Status: DC
Start: 2016-02-10 — End: 2016-08-07

## 2016-02-10 NOTE — Telephone Encounter (Signed)
No mention of depression in notes. We have never filled

## 2016-02-14 ENCOUNTER — Other Ambulatory Visit: Payer: Self-pay | Admitting: Family Medicine

## 2016-03-06 ENCOUNTER — Ambulatory Visit: Payer: BC Managed Care – PPO

## 2016-03-07 ENCOUNTER — Ambulatory Visit (INDEPENDENT_AMBULATORY_CARE_PROVIDER_SITE_OTHER): Payer: BC Managed Care – PPO

## 2016-03-07 DIAGNOSIS — E291 Testicular hypofunction: Secondary | ICD-10-CM | POA: Diagnosis not present

## 2016-03-07 NOTE — Progress Notes (Signed)
Testosterone injection, 400 mg, given to RUOQ, patient tolerated well.

## 2016-03-15 ENCOUNTER — Other Ambulatory Visit: Payer: Self-pay | Admitting: Family Medicine

## 2016-03-31 ENCOUNTER — Other Ambulatory Visit: Payer: BC Managed Care – PPO

## 2016-03-31 DIAGNOSIS — E785 Hyperlipidemia, unspecified: Secondary | ICD-10-CM

## 2016-03-31 DIAGNOSIS — I10 Essential (primary) hypertension: Secondary | ICD-10-CM

## 2016-03-31 DIAGNOSIS — R7989 Other specified abnormal findings of blood chemistry: Secondary | ICD-10-CM

## 2016-04-01 LAB — TESTOSTERONE: TESTOSTERONE: 120 ng/dL — AB (ref 264–916)

## 2016-04-01 LAB — LIPID PANEL
Chol/HDL Ratio: 3.6 ratio units (ref 0.0–5.0)
Cholesterol, Total: 135 mg/dL (ref 100–199)
HDL: 37 mg/dL — ABNORMAL LOW (ref >39)
LDL Calculated: 43 mg/dL (ref 0–99)
Triglycerides: 273 mg/dL — ABNORMAL HIGH (ref 0–149)
VLDL Cholesterol Cal: 55 mg/dL — ABNORMAL HIGH (ref 5–40)

## 2016-04-01 LAB — CMP14+EGFR
A/G RATIO: 2.2 (ref 1.2–2.2)
ALBUMIN: 4.4 g/dL (ref 3.6–4.8)
ALT: 21 IU/L (ref 0–44)
AST: 16 IU/L (ref 0–40)
Alkaline Phosphatase: 70 IU/L (ref 39–117)
BILIRUBIN TOTAL: 0.4 mg/dL (ref 0.0–1.2)
BUN / CREAT RATIO: 19 (ref 10–24)
BUN: 15 mg/dL (ref 8–27)
CHLORIDE: 98 mmol/L (ref 96–106)
CO2: 21 mmol/L (ref 18–29)
Calcium: 9.1 mg/dL (ref 8.6–10.2)
Creatinine, Ser: 0.79 mg/dL (ref 0.76–1.27)
GFR calc non Af Amer: 97 mL/min/{1.73_m2} (ref >59)
GFR, EST AFRICAN AMERICAN: 112 mL/min/{1.73_m2} (ref >59)
Globulin, Total: 2 g/dL (ref 1.5–4.5)
Glucose: 137 mg/dL — ABNORMAL HIGH (ref 65–99)
POTASSIUM: 3.9 mmol/L (ref 3.5–5.2)
Sodium: 138 mmol/L (ref 134–144)
TOTAL PROTEIN: 6.4 g/dL (ref 6.0–8.5)

## 2016-04-07 ENCOUNTER — Encounter: Payer: Self-pay | Admitting: Family Medicine

## 2016-04-07 ENCOUNTER — Ambulatory Visit (INDEPENDENT_AMBULATORY_CARE_PROVIDER_SITE_OTHER): Payer: BC Managed Care – PPO | Admitting: Family Medicine

## 2016-04-07 VITALS — BP 127/63 | HR 63 | Temp 97.7°F | Ht 66.0 in | Wt 284.2 lb

## 2016-04-07 DIAGNOSIS — E291 Testicular hypofunction: Secondary | ICD-10-CM

## 2016-04-07 DIAGNOSIS — I10 Essential (primary) hypertension: Secondary | ICD-10-CM

## 2016-04-07 DIAGNOSIS — E119 Type 2 diabetes mellitus without complications: Secondary | ICD-10-CM | POA: Diagnosis not present

## 2016-04-07 DIAGNOSIS — Z23 Encounter for immunization: Secondary | ICD-10-CM | POA: Diagnosis not present

## 2016-04-07 DIAGNOSIS — Z0000 Encounter for general adult medical examination without abnormal findings: Secondary | ICD-10-CM

## 2016-04-07 LAB — BAYER DCA HB A1C WAIVED: HB A1C (BAYER DCA - WAIVED): 6.2 % (ref <7.0)

## 2016-04-07 NOTE — Progress Notes (Signed)
   HPI  Patient presents today here for follow-up chronic medical conditions.  Diabetes Does not check blood sugar Good medication compliance No problems with metformin. Not watching his diet  Hypertension Good medication compliance No regular exercise however he's occupationally active.  Hypogonadism Good libido, good energy Numbers do not reflect how good he is doing clinically He would like to continue current regimen of once monthly injections  He is willing to get pneumonia vaccine today  patient works as an Clinical cytogeneticist  PMH: Smoking status noted ROS: Per HPI  Objective: BP 127/63   Pulse 63   Temp 97.7 F (36.5 C) (Oral)   Ht 5\' 6"  (1.676 m)   Wt 284 lb 3.2 oz (128.9 kg)   BMI 45.87 kg/m  Gen: NAD, alert, cooperative with exam HEENT: NCAT CV: RRR, good S1/S2, no murmur Resp: CTABL, no wheezes, non-labored Ext: No edema, warm Neuro: Alert and oriented, No gross deficits  Diabetic Foot Exam - Simple   Simple Foot Form Visual Inspection No deformities, no ulcerations, no other skin breakdown bilaterally:  Yes Sensation Testing Intact to touch and monofilament testing bilaterally:  Yes Pulse Check Posterior Tibialis and Dorsalis pulse intact bilaterally:  Yes Comments      Assessment and plan:  # Type 2 diabetes Previously well controlled, I expect the same today. A1c pending Continue metformin Recent creatinine is good Foot exam is normal  # Hypertension Well controlled Continue losartan/HCTZ  # Hypogonadism Testosterone level does not reflect the increase in dose 6 months ago I discussed this with the patient, he would like to continue as he feels clinically well   # healthCare maintenance Pneumovax given today    Orders Placed This Encounter  Procedures  . [facility] Hb A1c Carney Bern, MD [facility] 04/07/2016, 8:14 AM

## 2016-04-07 NOTE — Addendum Note (Signed)
Addended by: Karle Plumber on: 04/07/2016 08:47 AM   Modules accepted: Orders

## 2016-04-07 NOTE — Patient Instructions (Signed)
Great to see you!  Lets follow up in 4 months unless you need us sooner.    

## 2016-04-25 ENCOUNTER — Other Ambulatory Visit: Payer: Self-pay

## 2016-04-25 MED ORDER — METFORMIN HCL ER 500 MG PO TB24: 500.0000 mg | ORAL_TABLET | Freq: Every day | ORAL | 0 refills | Status: DC

## 2016-05-02 ENCOUNTER — Encounter: Payer: Self-pay | Admitting: Family Medicine

## 2016-05-04 MED ORDER — METFORMIN HCL ER 500 MG PO TB24: 1000.0000 mg | ORAL_TABLET | Freq: Every day | ORAL | 11 refills | Status: DC

## 2016-05-08 ENCOUNTER — Ambulatory Visit (INDEPENDENT_AMBULATORY_CARE_PROVIDER_SITE_OTHER): Payer: BC Managed Care – PPO | Admitting: *Deleted

## 2016-05-08 DIAGNOSIS — E291 Testicular hypofunction: Secondary | ICD-10-CM

## 2016-05-08 NOTE — Progress Notes (Signed)
Pt given Testosterone inj Tolerated well 

## 2016-05-12 ENCOUNTER — Other Ambulatory Visit: Payer: Self-pay | Admitting: *Deleted

## 2016-05-12 MED ORDER — TESTOSTERONE CYPIONATE 200 MG/ML IM SOLN: 400.0000 mg | INTRAMUSCULAR | 0 refills | Status: DC

## 2016-05-12 NOTE — Telephone Encounter (Signed)
Last filled 02/26/16. Last level drawn 03/31/16 and still low. Route to pool A for call in

## 2016-05-12 NOTE — Telephone Encounter (Signed)
Refilled Juana Diaz, MD [facility] 05/12/2016, 11:48 AM

## 2016-05-16 ENCOUNTER — Other Ambulatory Visit: Payer: Self-pay | Admitting: Family Medicine

## 2016-05-28 ENCOUNTER — Other Ambulatory Visit: Payer: Self-pay | Admitting: Family Medicine

## 2016-06-05 ENCOUNTER — Ambulatory Visit: Payer: BC Managed Care – PPO

## 2016-06-06 ENCOUNTER — Ambulatory Visit (INDEPENDENT_AMBULATORY_CARE_PROVIDER_SITE_OTHER): Payer: BC Managed Care – PPO | Admitting: *Deleted

## 2016-06-06 DIAGNOSIS — E291 Testicular hypofunction: Secondary | ICD-10-CM

## 2016-06-06 MED ORDER — TESTOSTERONE CYPIONATE 200 MG/ML IM SOLN
400.0000 mg | INTRAMUSCULAR | Status: DC
  Administered 2016-06-06 – 2017-01-08 (×6): 400 mg via INTRAMUSCULAR

## 2016-06-06 NOTE — Progress Notes (Signed)
Pt given Depo-testosterone 400mg  inj IM Tolerated well

## 2016-06-20 ENCOUNTER — Telehealth: Payer: Self-pay | Admitting: Family Medicine

## 2016-06-20 MED ORDER — METFORMIN HCL ER 500 MG PO TB24: 1000.0000 mg | ORAL_TABLET | Freq: Two times a day (BID) | ORAL | 11 refills | Status: DC

## 2016-06-20 NOTE — Telephone Encounter (Signed)
Patient aware.

## 2016-06-20 NOTE — Telephone Encounter (Signed)
RX sent  Laroy Apple, MD [facility] 06/20/2016, 11:47 AM

## 2016-06-20 NOTE — Telephone Encounter (Signed)
Patient reports he takes Metformin two tablets bid, chart lists once daily.  He would like a 90 day refill sent in for two bid, please advise.

## 2016-06-28 ENCOUNTER — Other Ambulatory Visit: Payer: Self-pay | Admitting: *Deleted

## 2016-06-29 MED ORDER — TESTOSTERONE CYPIONATE 200 MG/ML IM SOLN: 400.0000 mg | INTRAMUSCULAR | 0 refills | Status: DC

## 2016-06-30 NOTE — Telephone Encounter (Signed)
Phoned in.

## 2016-07-05 ENCOUNTER — Ambulatory Visit (INDEPENDENT_AMBULATORY_CARE_PROVIDER_SITE_OTHER): Payer: BC Managed Care – PPO | Admitting: *Deleted

## 2016-07-05 DIAGNOSIS — E291 Testicular hypofunction: Secondary | ICD-10-CM

## 2016-07-05 NOTE — Progress Notes (Signed)
Pt given Testosteron inj Tolerated well

## 2016-08-02 ENCOUNTER — Other Ambulatory Visit: Payer: BC Managed Care – PPO

## 2016-08-02 DIAGNOSIS — E785 Hyperlipidemia, unspecified: Secondary | ICD-10-CM

## 2016-08-02 DIAGNOSIS — E291 Testicular hypofunction: Secondary | ICD-10-CM

## 2016-08-02 DIAGNOSIS — I10 Essential (primary) hypertension: Secondary | ICD-10-CM

## 2016-08-02 DIAGNOSIS — E119 Type 2 diabetes mellitus without complications: Secondary | ICD-10-CM

## 2016-08-02 LAB — BAYER DCA HB A1C WAIVED: HB A1C: 5.9 % (ref <7.0)

## 2016-08-03 LAB — CMP14+EGFR
A/G RATIO: 1.8 (ref 1.2–2.2)
ALK PHOS: 70 IU/L (ref 39–117)
ALT: 23 IU/L (ref 0–44)
AST: 19 IU/L (ref 0–40)
Albumin: 4.3 g/dL (ref 3.6–4.8)
BILIRUBIN TOTAL: 0.6 mg/dL (ref 0.0–1.2)
BUN/Creatinine Ratio: 21 (ref 10–24)
BUN: 16 mg/dL (ref 8–27)
CHLORIDE: 98 mmol/L (ref 96–106)
CO2: 20 mmol/L (ref 18–29)
Calcium: 8.9 mg/dL (ref 8.6–10.2)
Creatinine, Ser: 0.77 mg/dL (ref 0.76–1.27)
GFR calc Af Amer: 113 mL/min/{1.73_m2} (ref >59)
GFR calc non Af Amer: 98 mL/min/{1.73_m2} (ref >59)
GLUCOSE: 135 mg/dL — AB (ref 65–99)
Globulin, Total: 2.4 g/dL (ref 1.5–4.5)
POTASSIUM: 3.8 mmol/L (ref 3.5–5.2)
Sodium: 139 mmol/L (ref 134–144)
TOTAL PROTEIN: 6.7 g/dL (ref 6.0–8.5)

## 2016-08-03 LAB — CBC WITH DIFFERENTIAL/PLATELET
BASOS ABS: 0.1 10*3/uL (ref 0.0–0.2)
BASOS: 1 %
EOS (ABSOLUTE): 0.1 10*3/uL (ref 0.0–0.4)
Eos: 1 %
Hematocrit: 48.5 % (ref 37.5–51.0)
Hemoglobin: 16.3 g/dL (ref 13.0–17.7)
IMMATURE GRANS (ABS): 0 10*3/uL (ref 0.0–0.1)
Immature Granulocytes: 0 %
LYMPHS: 23 %
Lymphocytes Absolute: 1.8 10*3/uL (ref 0.7–3.1)
MCH: 30.3 pg (ref 26.6–33.0)
MCHC: 33.6 g/dL (ref 31.5–35.7)
MCV: 90 fL (ref 79–97)
MONOS ABS: 0.5 10*3/uL (ref 0.1–0.9)
Monocytes: 7 %
NEUTROS ABS: 5.4 10*3/uL (ref 1.4–7.0)
NEUTROS PCT: 68 %
PLATELETS: 100 10*3/uL — AB (ref 150–379)
RBC: 5.38 x10E6/uL (ref 4.14–5.80)
RDW: 14.5 % (ref 12.3–15.4)
WBC: 8 10*3/uL (ref 3.4–10.8)

## 2016-08-03 LAB — TESTOSTERONE,FREE AND TOTAL
TESTOSTERONE: 75 ng/dL — AB (ref 264–916)
Testosterone, Free: 5.3 pg/mL — ABNORMAL LOW (ref 6.6–18.1)

## 2016-08-03 LAB — LIPID PANEL
CHOLESTEROL TOTAL: 139 mg/dL (ref 100–199)
Chol/HDL Ratio: 3.7 ratio units (ref 0.0–5.0)
HDL: 38 mg/dL — AB (ref >39)
LDL Calculated: 46 mg/dL (ref 0–99)
TRIGLYCERIDES: 274 mg/dL — AB (ref 0–149)
VLDL CHOLESTEROL CAL: 55 mg/dL — AB (ref 5–40)

## 2016-08-04 ENCOUNTER — Telehealth: Payer: Self-pay | Admitting: Family Medicine

## 2016-08-07 ENCOUNTER — Ambulatory Visit (INDEPENDENT_AMBULATORY_CARE_PROVIDER_SITE_OTHER): Payer: BC Managed Care – PPO | Admitting: Family Medicine

## 2016-08-07 ENCOUNTER — Other Ambulatory Visit: Payer: Self-pay | Admitting: Family Medicine

## 2016-08-07 ENCOUNTER — Encounter: Payer: Self-pay | Admitting: Family Medicine

## 2016-08-07 VITALS — BP 131/72 | HR 70 | Temp 97.9°F | Ht 66.0 in | Wt 273.2 lb

## 2016-08-07 DIAGNOSIS — E119 Type 2 diabetes mellitus without complications: Secondary | ICD-10-CM

## 2016-08-07 DIAGNOSIS — E291 Testicular hypofunction: Secondary | ICD-10-CM

## 2016-08-07 DIAGNOSIS — J0100 Acute maxillary sinusitis, unspecified: Secondary | ICD-10-CM

## 2016-08-07 DIAGNOSIS — N529 Male erectile dysfunction, unspecified: Secondary | ICD-10-CM

## 2016-08-07 MED ORDER — AMOXICILLIN-POT CLAVULANATE 875-125 MG PO TABS: 1.0000 | ORAL_TABLET | Freq: Two times a day (BID) | ORAL | 0 refills | Status: DC

## 2016-08-07 MED ORDER — SILDENAFIL CITRATE 20 MG PO TABS: ORAL_TABLET | ORAL | 3 refills | Status: DC

## 2016-08-07 MED ORDER — METFORMIN HCL ER 500 MG PO TB24: 500.0000 mg | ORAL_TABLET | Freq: Two times a day (BID) | ORAL | 11 refills | Status: DC

## 2016-08-07 MED ORDER — SERTRALINE HCL 100 MG PO TABS: 100.0000 mg | ORAL_TABLET | Freq: Every day | ORAL | 1 refills | Status: DC

## 2016-08-07 NOTE — Patient Instructions (Signed)
Great to see you!  We will keep an eye on your platelets  We will send you to endocrinology for testosterone deficiency.   Be sure to finish all antibiotics  Come back in 3 months   Sinusitis, Adult Sinusitis is soreness and inflammation of your sinuses. Sinuses are hollow spaces in the bones around your face. They are located:  Around your eyes.  In the middle of your forehead.  Behind your nose.  In your cheekbones. Your sinuses and nasal passages are lined with a stringy fluid (mucus). Mucus normally drains out of your sinuses. When your nasal tissues get inflamed or swollen, the mucus can get trapped or blocked so air cannot flow through your sinuses. This lets bacteria, viruses, and funguses grow, and that leads to infection. Follow these instructions at home: Medicines  Take, use, or apply over-the-counter and prescription medicines only as told by your doctor. These may include nasal sprays.  If you were prescribed an antibiotic medicine, take it as told by your doctor. Do not stop taking the antibiotic even if you start to feel better. Hydrate and Humidify  Drink enough water to keep your pee (urine) clear or pale yellow.  Use a cool mist humidifier to keep the humidity level in your home above 50%.  Breathe in steam for 10-15 minutes, 3-4 times a day or as told by your doctor. You can do this in the bathroom while a hot shower is running.  Try not to spend time in cool or dry air. Rest  Rest as much as possible.  Sleep with your head raised (elevated).  Make sure to get enough sleep each night. General instructions  Put a warm, moist washcloth on your face 3-4 times a day or as told by your doctor. This will help with discomfort.  Wash your hands often with soap and water. If there is no soap and water, use hand sanitizer.  Do not smoke. Avoid being around people who are smoking (secondhand smoke).  Keep all follow-up visits as told by your doctor. This is  important. Contact a doctor if:  You have a fever.  Your symptoms get worse.  Your symptoms do not get better within 10 days. Get help right away if:  You have a very bad headache.  You cannot stop throwing up (vomiting).  You have pain or swelling around your face or eyes.  You have trouble seeing.  You feel confused.  Your neck is stiff.  You have trouble breathing. This information is not intended to replace advice given to you by your health care provider. Make sure you discuss any questions you have with your health care provider. Document Released: 01/03/2008 Document Revised: 03/12/2016 Document Reviewed: 05/12/2015 Elsevier Interactive Patient Education  2017 [employer].

## 2016-08-07 NOTE — Progress Notes (Signed)
   HPI  Patient presents today here for follow up for chronic medical conditions.   T2DM  Doing well, has changed formulation of metformin recently with some loose stools.  No fluid numbness or tingling.  cold symptoms Patient with cough productive of green sputum over the last few days. He's had 2-3 weeks of maxillary pressure which has been helped by [facility], however it seems to be persistent and is now having worsening postnasal drip symptoms.  Erectile dysfunction Difficulty getting an erection, previously good helped with Viagra. Previously using samples.  Hypogonadism Patient has reduced sex drive, patient also has trouble with prolonged time before ejaculation. Good med compliance with testosterone.   Has fam Hx of hemochromatosis.   PMH: Smoking status noted ROS: Per HPI  Objective: BP 131/72   Pulse 70   Temp 97.9 F (36.6 C) (Oral)   Ht 5\' 6"  (1.676 m)   Wt 273 lb 3.2 oz (123.9 kg)   BMI 44.10 kg/m  Gen: NAD, alert, cooperative with exam HEENT: NCAT, bilateral maxillary pressure and tenderness to palpation, oropharynx clear CV: RRR, good S1/S2, no murmur Resp: CTABL, no wheezes, non-labored Ext: No edema, warm Neuro: Alert and oriented, No gross deficits  Diabetic Foot Exam - Simple   Simple Foot Form Visual Inspection No deformities, no ulcerations, no other skin breakdown bilaterally:  Yes Sensation Testing Intact to touch and monofilament testing bilaterally:  Yes Pulse Check Posterior Tibialis and Dorsalis pulse intact bilaterally:  Yes Comments      Assessment and plan:  # Type 2 diabetes Well-controlled Reduce metformin to 1 pill, 500 mg, twice daily Foot exam normal, no neuropathy  # Erectile dysfunction Trial of generic sildenafil Stable, likely worsened by hypogonadism  # Hypogonadism Difficult to control testosterone, referring to endocrinology, appreciate the recommendations and management  # Acute maxillary sinusitis Treat with  Augmentin, continue Flonase Reassuring physical exam, well appearing- non toxic  Meds ordered this encounter  Medications  . sertraline (ZOLOFT) 100 MG tablet    Sig: Take 1 tablet (100 mg total) by mouth daily.    Dispense:  90 tablet    Refill:  1  . sildenafil (REVATIO) 20 MG tablet    Sig: Take 1 to 5 pills once daily as needed for erectile dysfunction    Dispense:  30 tablet    Refill:  3  . amoxicillin-clavulanate (AUGMENTIN) 875-125 MG tablet    Sig: Take 1 tablet by mouth 2 (two) times daily.    Dispense:  20 tablet    Refill:  0  . metFORMIN (GLUCOPHAGE-XR) 500 MG 24 hr tablet    Sig: Take 1 tablet (500 mg total) by mouth 2 (two) times daily.    Dispense:  60 tablet    Refill:  [place], MD [facility] 08/07/2016, 8:31 AM

## 2016-08-07 NOTE — Telephone Encounter (Signed)
Was done a ov today

## 2016-08-09 ENCOUNTER — Other Ambulatory Visit: Payer: Self-pay | Admitting: Family Medicine

## 2016-10-11 ENCOUNTER — Encounter: Payer: Self-pay | Admitting: *Deleted

## 2016-10-21 ENCOUNTER — Other Ambulatory Visit: Payer: Self-pay | Admitting: Family Medicine

## 2016-10-25 ENCOUNTER — Other Ambulatory Visit: Payer: Self-pay | Admitting: Family Medicine

## 2016-10-26 ENCOUNTER — Other Ambulatory Visit: Payer: Self-pay

## 2016-10-26 MED ORDER — SERTRALINE HCL 100 MG PO TABS: 100.0000 mg | ORAL_TABLET | Freq: Every day | ORAL | 0 refills | Status: DC

## 2016-11-06 ENCOUNTER — Other Ambulatory Visit: Payer: BC Managed Care – PPO

## 2016-11-06 DIAGNOSIS — E291 Testicular hypofunction: Secondary | ICD-10-CM

## 2016-11-07 ENCOUNTER — Ambulatory Visit (INDEPENDENT_AMBULATORY_CARE_PROVIDER_SITE_OTHER): Payer: BC Managed Care – PPO | Admitting: Family Medicine

## 2016-11-07 ENCOUNTER — Encounter: Payer: Self-pay | Admitting: Family Medicine

## 2016-11-07 VITALS — BP 118/70 | HR 66 | Temp 97.1°F | Ht 66.0 in | Wt 274.8 lb

## 2016-11-07 DIAGNOSIS — N3941 Urge incontinence: Secondary | ICD-10-CM | POA: Diagnosis not present

## 2016-11-07 DIAGNOSIS — E119 Type 2 diabetes mellitus without complications: Secondary | ICD-10-CM

## 2016-11-07 DIAGNOSIS — M25512 Pain in left shoulder: Secondary | ICD-10-CM | POA: Diagnosis not present

## 2016-11-07 DIAGNOSIS — E291 Testicular hypofunction: Secondary | ICD-10-CM

## 2016-11-07 LAB — TESTOSTERONE,FREE AND TOTAL
Testosterone, Free: 3.8 pg/mL — ABNORMAL LOW (ref 6.6–18.1)
Testosterone: 44 ng/dL — ABNORMAL LOW (ref 264–916)

## 2016-11-07 LAB — BAYER DCA HB A1C WAIVED: HB A1C: 6.4 % (ref <7.0)

## 2016-11-07 NOTE — Patient Instructions (Signed)
Great to see you!  Come back in 3 months unless you need [place] sooner.   We will work on a urology appointment.

## 2016-11-07 NOTE — Progress Notes (Signed)
   HPI  Patient presents today here to follow-up for chronic medical conditions.  Shoulder pain Started after lifting around 100 rubber made buckets for work. Patient states he was lifting above his head No discrete injury, just deep seated left shoulder pain in the posterior anterior portion and lateral portion. She has taken some Aleve, also has taken 10 mg prednisone 2 or 3 times. Has also tried ice and heat. .  Diabetes Good medication compliance, only taking 500 mg metformin twice daily. Not really watching diet, he is occupationally active.  Testosterone- hypogonadism Patient has stopped testosterone injections, he has had some decreased libido. Energy is okay. complains also with intermittent urge incontinence. This has been going on for months, only occasionally. Described as having the urge to urinate, however not being able to hold it long enough to get there. No feelings of needing to go the restroom without actually needing to go No nocturia or weak stream.    PMH: Smoking status noted ROS: Per HPI  Objective: BP 118/70   Pulse 66   Temp 97.1 F (36.2 C) (Oral)   Ht 5\' 6"  (1.676 m)   Wt 274 lb 12.8 oz (124.6 kg)   BMI 44.35 kg/m  Gen: NAD, alert, cooperative with exam HEENT: NCAT CV: RRR, good S1/S2, no murmur Resp: CTABL, no wheezes, non-labored Ext: No edema, warm Neuro: Alert and oriented, No gross deficits  MSK No tenderness with empty can test or Hawkins test Mild tenderness to palpation over the bicipital groove on the left  Assessment and plan:  #  urge incontinence New complaint of urge incontinence, happening occasionally. No symptoms of bladder spasms Referring urology for their opinion, erectile dysfunction has improved with 20 mg sildenafil  # Hypogonadism After previous reduced testosterone level patient felt that the injection was likely not maintaining his testosterone, he stopped taking the medication and his testosterone is down to  44 today. The result has come in just after he left the clinic so this has not been discussed yet, there will be a phone note following. Will recommend restarting testosterone and following up with urology  # Type 2 diabetes Control slipping slightly, however he has reduced his metformin dose, A1c still controlled at 6.4 No changes  # Shoulder pain Likely biceps tendinitis Recommended rest, ice, supportive care.     Orders Placed This Encounter  Procedures  . Microalbumin / creatinine urine ratio  . Bayer DCA Hb A1c Waived  . Ambulatory referral to Urology    Referral Priority:   Routine    Referral Type:   Consultation    Referral Reason:   Specialty Services Required    Requested Specialty:   Urology    Number of Visits Requested:   [place], MD [place] 11/07/2016, 8:54 AM

## 2016-11-08 ENCOUNTER — Ambulatory Visit (INDEPENDENT_AMBULATORY_CARE_PROVIDER_SITE_OTHER): Payer: BC Managed Care – PPO | Admitting: *Deleted

## 2016-11-08 DIAGNOSIS — E291 Testicular hypofunction: Secondary | ICD-10-CM

## 2016-11-08 LAB — MICROALBUMIN / CREATININE URINE RATIO
CREATININE, UR: 117.9 mg/dL
MICROALBUM., U, RANDOM: 10.3 ug/mL
Microalb/Creat Ratio: 8.7 mg/g creat (ref 0.0–30.0)

## 2016-11-08 NOTE — Progress Notes (Signed)
Pt given Testosterone inj Tolerated well 

## 2016-11-09 ENCOUNTER — Ambulatory Visit: Payer: BC Managed Care – PPO | Admitting: Family Medicine

## 2016-11-15 ENCOUNTER — Encounter: Payer: Self-pay | Admitting: *Deleted

## 2016-11-22 ENCOUNTER — Other Ambulatory Visit: Payer: Self-pay | Admitting: *Deleted

## 2016-11-22 MED ORDER — SIMVASTATIN 40 MG PO TABS: 40.0000 mg | ORAL_TABLET | Freq: Every day | ORAL | 3 refills | Status: DC

## 2016-12-06 ENCOUNTER — Ambulatory Visit (INDEPENDENT_AMBULATORY_CARE_PROVIDER_SITE_OTHER): Payer: BC Managed Care – PPO | Admitting: *Deleted

## 2016-12-06 DIAGNOSIS — E291 Testicular hypofunction: Secondary | ICD-10-CM

## 2016-12-06 NOTE — Progress Notes (Signed)
Pt given Testosterone inj Tolerated well 

## 2017-01-05 ENCOUNTER — Other Ambulatory Visit: Payer: Self-pay | Admitting: Pediatrics

## 2017-01-05 DIAGNOSIS — E291 Testicular hypofunction: Secondary | ICD-10-CM

## 2017-01-05 MED ORDER — TESTOSTERONE CYPIONATE 200 MG/ML IM SOLN: 400.0000 mg | INTRAMUSCULAR | 0 refills | Status: DC

## 2017-01-08 ENCOUNTER — Ambulatory Visit (INDEPENDENT_AMBULATORY_CARE_PROVIDER_SITE_OTHER): Payer: BC Managed Care – PPO | Admitting: *Deleted

## 2017-01-08 DIAGNOSIS — E291 Testicular hypofunction: Secondary | ICD-10-CM

## 2017-01-08 NOTE — Progress Notes (Signed)
Pt given Testosterone inj Tolerated well 

## 2017-01-09 ENCOUNTER — Ambulatory Visit (INDEPENDENT_AMBULATORY_CARE_PROVIDER_SITE_OTHER): Payer: BC Managed Care – PPO | Admitting: Urology

## 2017-01-09 DIAGNOSIS — R3915 Urgency of urination: Secondary | ICD-10-CM | POA: Diagnosis not present

## 2017-01-09 DIAGNOSIS — N5201 Erectile dysfunction due to arterial insufficiency: Secondary | ICD-10-CM | POA: Diagnosis not present

## 2017-01-09 DIAGNOSIS — E291 Testicular hypofunction: Secondary | ICD-10-CM | POA: Diagnosis not present

## 2017-01-23 ENCOUNTER — Ambulatory Visit (INDEPENDENT_AMBULATORY_CARE_PROVIDER_SITE_OTHER): Payer: BC Managed Care – PPO | Admitting: Urology

## 2017-01-23 DIAGNOSIS — N5201 Erectile dysfunction due to arterial insufficiency: Secondary | ICD-10-CM

## 2017-01-23 DIAGNOSIS — E291 Testicular hypofunction: Secondary | ICD-10-CM

## 2017-01-24 ENCOUNTER — Other Ambulatory Visit: Payer: Self-pay | Admitting: Family Medicine

## 2017-01-25 ENCOUNTER — Ambulatory Visit: Payer: BC Managed Care – PPO | Admitting: Family Medicine

## 2017-02-07 ENCOUNTER — Ambulatory Visit (INDEPENDENT_AMBULATORY_CARE_PROVIDER_SITE_OTHER): Payer: BC Managed Care – PPO | Admitting: Family Medicine

## 2017-02-07 ENCOUNTER — Encounter: Payer: Self-pay | Admitting: Family Medicine

## 2017-02-07 VITALS — BP 131/78 | HR 72 | Temp 98.1°F | Ht 66.0 in | Wt 288.6 lb

## 2017-02-07 DIAGNOSIS — E119 Type 2 diabetes mellitus without complications: Secondary | ICD-10-CM | POA: Diagnosis not present

## 2017-02-07 DIAGNOSIS — G8929 Other chronic pain: Secondary | ICD-10-CM

## 2017-02-07 DIAGNOSIS — M5440 Lumbago with sciatica, unspecified side: Secondary | ICD-10-CM | POA: Diagnosis not present

## 2017-02-07 DIAGNOSIS — R229 Localized swelling, mass and lump, unspecified: Secondary | ICD-10-CM

## 2017-02-07 LAB — BAYER DCA HB A1C WAIVED: HB A1C (BAYER DCA - WAIVED): 6.6 % (ref <7.0)

## 2017-02-07 MED ORDER — GABAPENTIN 100 MG PO CAPS: 100.0000 mg | ORAL_CAPSULE | Freq: Three times a day (TID) | ORAL | 3 refills | Status: DC

## 2017-02-07 NOTE — Patient Instructions (Signed)
Great to see you!  Try gabapentin, 1 pill at night for 3 days, then twice daily for 3 days then 3 times daily.

## 2017-02-07 NOTE — Progress Notes (Signed)
   HPI  Patient presents today here for follow-up chronic medical conditions.  Type 2 diabetes Not checking blood sugars are watching diet. Good form and compliance and tolerance.  Skin nodule Right palm, present for 2-3 months, some tenderness. Patient has had carpal tunnel release and trigger finger addressed in the same hand.  performed by Dr. Mayer Camel at [facility] orthopedics  Chronic low back pain Midline low back pain radiating to bilateral paraspinal muscles and down bilateral posterior legs. Patient states it's fine at rest that hurts worse with activity. Has not tried any regular medications for this. He is interested in injections if they begin official, also he's interested in medications if they're available.  PMH: Smoking status noted ROS: Per HPI  Objective: BP 131/78   Pulse 72   Temp 98.1 F (36.7 C) (Oral)   Ht 5\' 6"  (1.676 m)   Wt 288 lb 9.6 oz (130.9 kg)   BMI 46.58 kg/m  Gen: NAD, alert, cooperative with exam HEENT: NCAT CV: RRR, good S1/S2, no murmur Resp: CTABL, no wheezes, non-labored Ext: No edema, warm Neuro: Alert and oriented, No gross deficits  Assessment and plan:  # Type 2 diabetes Clinically well controlled, A1c pending. For now continue current dose of metformin, discussed with patient weight loss could create possibility for stopping medication.  # Skin nodule All the home, likely ganglion cyst, however it's in a very unusual location. 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     Orders Placed This Encounter  Procedures  . Bayer DCA Hb A1c Waived    Meds ordered this encounter  Medications  . gabapentin (NEURONTIN) 100 MG capsule    Sig: Take 1 capsule (100 mg total) by mouth 3 (three) times daily.    Dispense:  90 capsule    Refill:  [place], MD [facility] 02/07/2017, 9:57 AM

## 2017-02-08 ENCOUNTER — Ambulatory Visit: Payer: BC Managed Care – PPO | Admitting: Family Medicine

## 2017-03-15 ENCOUNTER — Other Ambulatory Visit: Payer: Self-pay | Admitting: Family Medicine

## 2017-03-27 ENCOUNTER — Ambulatory Visit (INDEPENDENT_AMBULATORY_CARE_PROVIDER_SITE_OTHER): Payer: BC Managed Care – PPO | Admitting: Urology

## 2017-03-27 DIAGNOSIS — N5201 Erectile dysfunction due to arterial insufficiency: Secondary | ICD-10-CM

## 2017-03-27 DIAGNOSIS — E291 Testicular hypofunction: Secondary | ICD-10-CM

## 2017-04-15 ENCOUNTER — Other Ambulatory Visit: Payer: Self-pay | Admitting: Family Medicine

## 2017-04-29 ENCOUNTER — Other Ambulatory Visit: Payer: Self-pay | Admitting: Family Medicine

## 2017-05-11 ENCOUNTER — Ambulatory Visit: Payer: BC Managed Care – PPO | Admitting: Family Medicine

## 2017-05-17 ENCOUNTER — Ambulatory Visit (INDEPENDENT_AMBULATORY_CARE_PROVIDER_SITE_OTHER): Payer: BC Managed Care – PPO | Admitting: Family Medicine

## 2017-05-17 ENCOUNTER — Encounter: Payer: Self-pay | Admitting: Family Medicine

## 2017-05-17 VITALS — BP 136/79 | HR 76 | Temp 97.5°F | Ht 66.0 in | Wt 287.0 lb

## 2017-05-17 DIAGNOSIS — E119 Type 2 diabetes mellitus without complications: Secondary | ICD-10-CM | POA: Diagnosis not present

## 2017-05-17 DIAGNOSIS — H6983 Other specified disorders of Eustachian tube, bilateral: Secondary | ICD-10-CM | POA: Diagnosis not present

## 2017-05-17 DIAGNOSIS — R21 Rash and other nonspecific skin eruption: Secondary | ICD-10-CM | POA: Diagnosis not present

## 2017-05-17 DIAGNOSIS — L03011 Cellulitis of right finger: Secondary | ICD-10-CM

## 2017-05-17 DIAGNOSIS — Z23 Encounter for immunization: Secondary | ICD-10-CM | POA: Diagnosis not present

## 2017-05-17 LAB — CMP14+EGFR
A/G RATIO: 2.3 — AB (ref 1.2–2.2)
ALBUMIN: 4.3 g/dL (ref 3.6–4.8)
ALK PHOS: 69 IU/L (ref 39–117)
ALT: 27 IU/L (ref 0–44)
AST: 24 IU/L (ref 0–40)
BILIRUBIN TOTAL: 0.6 mg/dL (ref 0.0–1.2)
BUN / CREAT RATIO: 18 (ref 10–24)
BUN: 13 mg/dL (ref 8–27)
CHLORIDE: 98 mmol/L (ref 96–106)
CO2: 23 mmol/L (ref 20–29)
Calcium: 9 mg/dL (ref 8.6–10.2)
Creatinine, Ser: 0.72 mg/dL — ABNORMAL LOW (ref 0.76–1.27)
GFR calc Af Amer: 116 mL/min/{1.73_m2} (ref >59)
GFR calc non Af Amer: 100 mL/min/{1.73_m2} (ref >59)
GLOBULIN, TOTAL: 1.9 g/dL (ref 1.5–4.5)
Glucose: 160 mg/dL — ABNORMAL HIGH (ref 65–99)
POTASSIUM: 3.7 mmol/L (ref 3.5–5.2)
SODIUM: 136 mmol/L (ref 134–144)
Total Protein: 6.2 g/dL (ref 6.0–8.5)

## 2017-05-17 LAB — BAYER DCA HB A1C WAIVED: HB A1C (BAYER DCA - WAIVED): 6.9 % (ref <7.0)

## 2017-05-17 MED ORDER — CEPHALEXIN 500 MG PO CAPS: 500.0000 mg | ORAL_CAPSULE | Freq: Three times a day (TID) | ORAL | 0 refills | Status: DC

## 2017-05-17 MED ORDER — FLUCONAZOLE 150 MG PO TABS: ORAL_TABLET | ORAL | 0 refills | Status: DC

## 2017-05-17 MED ORDER — TRIAMCINOLONE ACETONIDE 0.5 % EX OINT: 1.0000 "application " | TOPICAL_OINTMENT | Freq: Two times a day (BID) | CUTANEOUS | 0 refills | Status: DC

## 2017-05-17 NOTE — Progress Notes (Signed)
   HPI  Patient presents today here to follow-up for diabetes and a couple of other questions.  Diabetes Doing well Good medication compliance, watching diet, very active, not checking blood sugars.  Rash Present for a few months, chest with red rash, bilateral hands with peeling and scaling.  Right thumbnail with chronic scaling, open fissure, and darkening of the nail, patient can express aggressively and have small amount of yellow fluid canal. No chronic hand with numbness.   PMH: Smoking status noted ROS: Per HPI  Objective: BP 136/79   Pulse 76   Temp (!) 97.5 F (36.4 C) (Oral)   Ht _0  (1.676 m)   Wt 287 lb (130.2 kg)   BMI 46.32 kg/m  Gen: NAD, alert, cooperative with exam HEENT: NCAT,TMs normal bilaterally with some heme crusted cerumen in the left, nares with some fresh blood and heme crusting in the right and boggy erythematous mucosa  CV: RRR, good S1/S2, no murmur Resp: CTABL, no wheezes, non-labored Ext: No edema, warm Neuro: Alert and oriented, No gross deficits   Skin Slightly erythematous rash on the chest with well demarcated areas of erythema and unaffected skin Right thumbnail with slight hyperpigmentation at the base, fissure on the medial cuticle with small amount of clear yellow fluid expressed with patient squeezing the base    Assessment and plan:  # Type 2 diabetes Previously well-controlled No changes, continue metformin.   # Rash Chest, likely tinea versicolor, Diflucan Patient also with scaling and peeling of the hands, consider psoriasis with his fingernail changes Recommended discussing with dermatology  # Chronic paronychia Right thumb, most likely diagnosis, some similar changes in other fingers as well without fissure. Keflex given yellow expression of fluid, triamcinolone  # Eustachian tube dysfunction Continue Singulair, continue Flonase, which he may need to hold for time given the fresh blood. Adding plain  Claritin   Orders Placed This Encounter  Procedures  . Bayer DCA Hb A1c Waived  . CMP14+EGFR    Meds ordered this encounter  Medications  . fluconazole (DIFLUCAN) 150 MG tablet    Sig: 1 pill once weekly    Dispense:  2 tablet    Refill:  0  . cephALEXin (KEFLEX) 500 MG capsule    Sig: Take 1 capsule (500 mg total) by mouth 3 (three) times daily.    Dispense:  21 capsule    Refill:  0  . triamcinolone ointment (KENALOG) 0.5 %    Sig: Apply 1 application topically 2 (two) times daily.    Dispense:  30 g    Refill:  0    Laroy Apple, MD [facility] 05/17/2017, 8:22 AM

## 2017-05-17 NOTE — Patient Instructions (Signed)
Great to see you!  For your finger:  Take all antibiotics Apply the steroid ointment 2 times daily for 1-2 weeks  For the rash on your chest- diflucan  For the eustachian tube dysfunction- try a plain claritin once daily ( not claritin D)

## 2017-06-12 ENCOUNTER — Other Ambulatory Visit: Payer: Self-pay | Admitting: Family Medicine

## 2017-06-27 ENCOUNTER — Telehealth: Payer: Self-pay | Admitting: Family Medicine

## 2017-06-27 ENCOUNTER — Ambulatory Visit: Payer: BC Managed Care – PPO | Admitting: Family Medicine

## 2017-06-27 ENCOUNTER — Encounter: Payer: Self-pay | Admitting: Family Medicine

## 2017-06-27 ENCOUNTER — Ambulatory Visit (INDEPENDENT_AMBULATORY_CARE_PROVIDER_SITE_OTHER): Payer: BC Managed Care – PPO

## 2017-06-27 VITALS — BP 134/70 | HR 109 | Temp 98.3°F | Ht 66.0 in | Wt 284.0 lb

## 2017-06-27 DIAGNOSIS — M25562 Pain in left knee: Secondary | ICD-10-CM

## 2017-06-27 NOTE — Telephone Encounter (Signed)
Appt made for today per patients request.  ?

## 2017-06-27 NOTE — Progress Notes (Signed)
Subjective:  Patient ID: Steve Williams, male    DOB: 1955-08-28  Age: 61 y.o. MRN: [id number]  CC: Knee Pain (pt here today c/o left knee pain and swelling x 3 days)   HPI [name] presents for 3 days of increasing pain in the left knee.  Of note is that he injured it originally in high school football at age 47.  He has had off and on problems ever since then.  7 years ago he had arthroscopy and was told by his orthopedist that he was a candidate for knee replacement.  He would like to have a knee injection tonight.  He has a patent an appointment with his orthopedist tomorrow.  He hopes to get relief with the injection to put off his knee replacement for a little bit longer. He has pain with all range of motion.  He has to me up to ambulate.  It is limiting his activities outside of ADLs and IADLs.  Pain is moderately severe at 6-8/10 for all range of motion.  It is interfering with sleep.  He took some of his wife's prednisone in order to get at least a little bit of sleep last night. Depression screen [facility] 2/9 06/27/2017 05/17/2017 02/07/2017  Decreased Interest 0 0 0  Down, Depressed, Hopeless 0 0 0  PHQ - 2 Score 0 0 0    History Dashiel has a past medical history of Allergy, Anxiety, Arthritis, Asthma, Cancer ([place]), Depression, Diabetes mellitus without complication ([place]), Hyperlipidemia, and Hypertension.   He has a past surgical history that includes Knee surgery (Left, 1974); Knee arthroscopy (Left); Carpal bone excision (Bilateral); Trigger finger release (Right); and lipoma removed.   His family history includes Cancer in his father; Diabetes in his father, mother, and sister; Heart disease in his father and mother; Hypertension in his mother and sister; Migraines in his son.He reports that  has never smoked. he has never used smokeless tobacco. He reports that he does not drink alcohol or use drugs.    ROS Review of Systems Noncontributory Objective:  BP 134/70   Pulse (!)  109   Temp 98.3 F (36.8 C) (Oral)   Ht 5\' 6"  (1.676 m)   Wt 284 lb (128.8 kg)   BMI 45.84 kg/m   BP Readings from Last 3 Encounters:  06/27/17 134/70  05/17/17 136/79  02/07/17 131/78    Wt Readings from Last 3 Encounters:  06/27/17 284 lb (128.8 kg)  05/17/17 287 lb (130.2 kg)  02/07/17 288 lb 9.6 oz (130.9 kg)     Physical Exam  Musculoskeletal:  The left knee has diminished l range of motion with moderate discomfort actively and passively.  Noted to have mild to moderate limp with his gait the joint lines are moderately tender r. The patella is palpable.  It is not ballotable but there is palpable arthritic deformity.  Lachman / anterior drawer signs are negative for signs of instability and pain free. McMurray testing reveals no pop or excessive discomfort. Varus and valgus stress maneuvers do not cause ligamentous stretch or instability.  However, they are quite uncomfortable    A steroid injection was performed at the lateral joint line using skin bleb of 2% lidocaine with epi.  No fluid could be obtained on aspiration attempt once the joint space was entered.  The joint space was injected Marcan 3 cc and 12 mg of Celestone. This was well tolerated.   Assessment & Plan:   Rylee was seen  today for knee pain.  Diagnoses and all orders for this visit:  Left knee pain, unspecified chronicity -     DG Knee 1-2 Views Left; Future       I am having Maudry Mayhew. [place] "Merry Proud" maintain his aspirin, fluticasone, sildenafil, montelukast, simvastatin, testosterone cypionate, metFORMIN, sertraline, fluconazole, cephALEXin, triamcinolone ointment, metFORMIN, and losartan-hydrochlorothiazide. We will continue to administer testosterone cypionate.  Allergies as of 06/27/2017      Reactions   Lisinopril    Erythromycin Rash      Medication List        Accurate as of 06/27/17  6:08 PM. Always use your most recent med list.          aspirin 81 MG tablet Take 81 mg by mouth  daily.   cephALEXin 500 MG capsule Commonly known as:  KEFLEX Take 1 capsule (500 mg total) by mouth 3 (three) times daily.   fluconazole 150 MG tablet Commonly known as:  DIFLUCAN 1 pill once weekly   fluticasone 50 MCG/ACT nasal spray Commonly known as:  FLONASE Place into both nostrils daily.   losartan-hydrochlorothiazide 100-25 MG tablet Commonly known as:  HYZAAR TAKE 1 TABLET DAILY   metFORMIN 500 MG 24 hr tablet Commonly known as:  GLUCOPHAGE-XR Take 1 tablet (500 mg total) by mouth 2 (two) times daily.   metFORMIN 500 MG 24 hr tablet Commonly known as:  GLUCOPHAGE-XR TAKE 1 TABLET TWICE A DAY   montelukast 10 MG tablet Commonly known as:  SINGULAIR TAKE 1 TABLET AT BEDTIME   sertraline 100 MG tablet Commonly known as:  ZOLOFT TAKE 1 TABLET DAILY   sildenafil 20 MG tablet Commonly known as:  REVATIO Take 1 to 5 pills once daily as needed for erectile dysfunction   simvastatin 40 MG tablet Commonly known as:  ZOCOR Take 1 tablet (40 mg total) by mouth daily.   testosterone cypionate 200 MG/ML injection Commonly known as:  DEPOTESTOSTERONE CYPIONATE Inject 2 mLs (400 mg total) into the muscle every 28 (twenty-eight) days.   triamcinolone ointment 0.5 % Commonly known as:  KENALOG Apply 1 application topically 2 (two) times daily.        Follow-up: Return if symptoms worsen or fail to improve.  Claretta Fraise, M.D.

## 2017-07-19 ENCOUNTER — Other Ambulatory Visit: Payer: Self-pay | Admitting: Family Medicine

## 2017-07-25 ENCOUNTER — Other Ambulatory Visit: Payer: Self-pay

## 2017-07-25 MED ORDER — SIMVASTATIN 40 MG PO TABS: 40.0000 mg | ORAL_TABLET | Freq: Every day | ORAL | 0 refills | Status: DC

## 2017-07-25 NOTE — Telephone Encounter (Signed)
Last lipid 08/02/16  Dr Wendi Snipes

## 2017-07-26 ENCOUNTER — Other Ambulatory Visit: Payer: Self-pay | Admitting: *Deleted

## 2017-08-18 ENCOUNTER — Other Ambulatory Visit: Payer: Self-pay | Admitting: Family Medicine

## 2017-08-20 ENCOUNTER — Ambulatory Visit: Payer: BC Managed Care – PPO | Admitting: Family Medicine

## 2017-08-20 ENCOUNTER — Encounter: Payer: Self-pay | Admitting: Family Medicine

## 2017-08-20 VITALS — BP 147/80 | HR 69 | Temp 97.2°F | Ht 66.0 in | Wt 283.2 lb

## 2017-08-20 DIAGNOSIS — E119 Type 2 diabetes mellitus without complications: Secondary | ICD-10-CM | POA: Diagnosis not present

## 2017-08-20 DIAGNOSIS — I10 Essential (primary) hypertension: Secondary | ICD-10-CM

## 2017-08-20 DIAGNOSIS — L02421 Furuncle of right axilla: Secondary | ICD-10-CM

## 2017-08-20 LAB — BAYER DCA HB A1C WAIVED: HB A1C (BAYER DCA - WAIVED): 7.8 % — ABNORMAL HIGH (ref <7.0)

## 2017-08-20 NOTE — Patient Instructions (Signed)
Great to see you!  Come back in 3 months unless you need us sooner.    

## 2017-08-20 NOTE — Progress Notes (Signed)
   HPI  Patient presents today for chronic medical conditions.  Type 2 diabetes Good medication compliance, not checking CBGs, watching diet moderately.  Hypertension No headache or chest pain.  Patient states that he is good to have a left knee replacement sometime likely in the next year, he would like to wait on EKG. He can walk up a flight of stairs without chest pain or shortness of breath, no history of cardiac disease.  Right axilla with swollen area that is improving.  It is still a little bit bothersome but nothing serious. No fever, chills, sweats. Was larger, more red, and more bothersome.  It is improving.  PMH: Smoking status noted ROS: Per HPI  Objective: BP (!) 147/80   Pulse 69   Temp (!) 97.2 F (36.2 C) (Oral)   Ht '5\' 6"'$  (1.676 m)   Wt 283 lb 3.2 oz (128.5 kg)   BMI 45.71 kg/m  Gen: NAD, alert, cooperative with exam HEENT: NCAT CV: RRR, good S1/S2, no murmur Resp: CTABL, no wheezes, non-labored Ext: No edema, warm Neuro: Alert and oriented, No gross deficits Skin Skin tags right axilla Erythematous slightly tender nodule the right axilla, approximately 5-8 mm in diameter  Assessment and plan:  #Type 2 diabetes Expect good control, previously very well controlled A1c pending Continue current medications including metformin  #Hypertension Borderline today, however reasonable most of the time Continue current medications including losartan/HCTZ  #Furuncle, right axilla Resolving, small and nontender Offered Keflex, he will take some leftover Keflex he has at home and call back if he has any problems. Offered mupirocin as well   Orders Placed This Encounter  Procedures  . Bayer DCA Hb A1c Waived  . CMP14+EGFR  . CBC with Differential/Platelet  . Lipid panel  . TSH     Laroy Apple, MD [facility] 08/20/2017, 8:12 AM

## 2017-08-21 LAB — CMP14+EGFR
ALT: 25 IU/L (ref 0–44)
AST: 17 IU/L (ref 0–40)
Albumin/Globulin Ratio: 2.3 — ABNORMAL HIGH (ref 1.2–2.2)
Albumin: 4.3 g/dL (ref 3.6–4.8)
Alkaline Phosphatase: 73 IU/L (ref 39–117)
BILIRUBIN TOTAL: 0.5 mg/dL (ref 0.0–1.2)
BUN/Creatinine Ratio: 14 (ref 10–24)
BUN: 11 mg/dL (ref 8–27)
CHLORIDE: 98 mmol/L (ref 96–106)
CO2: 23 mmol/L (ref 20–29)
Calcium: 9.3 mg/dL (ref 8.6–10.2)
Creatinine, Ser: 0.76 mg/dL (ref 0.76–1.27)
GFR calc Af Amer: 113 mL/min/{1.73_m2} (ref >59)
GFR calc non Af Amer: 98 mL/min/{1.73_m2} (ref >59)
GLUCOSE: 175 mg/dL — AB (ref 65–99)
Globulin, Total: 1.9 g/dL (ref 1.5–4.5)
Potassium: 3.8 mmol/L (ref 3.5–5.2)
Sodium: 138 mmol/L (ref 134–144)
TOTAL PROTEIN: 6.2 g/dL (ref 6.0–8.5)

## 2017-08-21 LAB — CBC WITH DIFFERENTIAL/PLATELET
BASOS ABS: 0.1 10*3/uL (ref 0.0–0.2)
Basos: 1 %
EOS (ABSOLUTE): 0.1 10*3/uL (ref 0.0–0.4)
EOS: 1 %
HEMATOCRIT: 45.4 % (ref 37.5–51.0)
HEMOGLOBIN: 16.8 g/dL (ref 13.0–17.7)
Immature Grans (Abs): 0.1 10*3/uL (ref 0.0–0.1)
Immature Granulocytes: 1 %
LYMPHS ABS: 2.1 10*3/uL (ref 0.7–3.1)
Lymphs: 28 %
MCH: 31.8 pg (ref 26.6–33.0)
MCHC: 37 g/dL — ABNORMAL HIGH (ref 31.5–35.7)
MCV: 86 fL (ref 79–97)
MONOCYTES: 7 %
Monocytes Absolute: 0.5 10*3/uL (ref 0.1–0.9)
NEUTROS ABS: 4.7 10*3/uL (ref 1.4–7.0)
Neutrophils: 62 %
Platelets: 103 10*3/uL — ABNORMAL LOW (ref 150–379)
RBC: 5.28 x10E6/uL (ref 4.14–5.80)
RDW: 14.2 % (ref 12.3–15.4)
WBC: 7.5 10*3/uL (ref 3.4–10.8)

## 2017-08-21 LAB — LIPID PANEL
CHOL/HDL RATIO: 4 ratio (ref 0.0–5.0)
Cholesterol, Total: 135 mg/dL (ref 100–199)
HDL: 34 mg/dL — AB (ref >39)
LDL CALC: 44 mg/dL (ref 0–99)
TRIGLYCERIDES: 287 mg/dL — AB (ref 0–149)
VLDL Cholesterol Cal: 57 mg/dL — ABNORMAL HIGH (ref 5–40)

## 2017-08-21 LAB — TSH: TSH: 0.756 u[IU]/mL (ref 0.450–4.500)

## 2017-09-09 ENCOUNTER — Other Ambulatory Visit: Payer: Self-pay | Admitting: Family Medicine

## 2017-09-12 ENCOUNTER — Other Ambulatory Visit: Payer: Self-pay | Admitting: Family Medicine

## 2017-10-05 ENCOUNTER — Encounter: Payer: Self-pay | Admitting: *Deleted

## 2017-10-08 ENCOUNTER — Other Ambulatory Visit: Payer: Self-pay | Admitting: Family Medicine

## 2017-10-30 ENCOUNTER — Other Ambulatory Visit: Payer: Self-pay | Admitting: Family Medicine

## 2017-10-30 ENCOUNTER — Encounter: Payer: Self-pay | Admitting: Family Medicine

## 2017-10-30 ENCOUNTER — Ambulatory Visit: Payer: BC Managed Care – PPO | Admitting: Family Medicine

## 2017-10-30 VITALS — BP 133/81 | HR 80 | Temp 97.5°F | Ht 66.0 in | Wt 271.0 lb

## 2017-10-30 DIAGNOSIS — Z9989 Dependence on other enabling machines and devices: Secondary | ICD-10-CM

## 2017-10-30 DIAGNOSIS — G4733 Obstructive sleep apnea (adult) (pediatric): Secondary | ICD-10-CM | POA: Diagnosis not present

## 2017-10-30 DIAGNOSIS — J0100 Acute maxillary sinusitis, unspecified: Secondary | ICD-10-CM

## 2017-10-30 LAB — VERITOR FLU A/B WAIVED
INFLUENZA A: NEGATIVE
INFLUENZA B: NEGATIVE

## 2017-10-30 MED ORDER — AMOXICILLIN-POT CLAVULANATE 875-125 MG PO TABS: 1.0000 | ORAL_TABLET | Freq: Two times a day (BID) | ORAL | 0 refills | Status: DC

## 2017-10-30 MED ORDER — METHYLPREDNISOLONE ACETATE 80 MG/ML IJ SUSP
80.0000 mg | Freq: Once | INTRAMUSCULAR | Status: AC
  Administered 2017-10-30: 80 mg via INTRAMUSCULAR

## 2017-10-30 NOTE — Progress Notes (Addendum)
   HPI  Patient presents today here for illness.  Patient explains that he had sudden onset illness about 2 days ago.  He states that his temperature was 99.1 last night this is very unusual for him.  He states that he is sneezed over 100 times in the last 24 hours.  He reports bilateral frontal and maxillary pain and pressure, nasal congestion, postnasal drip, frequent throat clearing.  He denies any shortness of breath, body aches, or chills.  His wife also had similar symptoms that came on strong and then resolved for her already. He has had a flu shot, she has not  PMH: Smoking status noted ROS: Per HPI  Objective: BP 133/81   Pulse 80   Temp (!) 97.5 F (36.4 C) (Oral)   Ht 5\' 6"  (1.676 m)   Wt 271 lb (122.9 kg)   BMI 43.74 kg/m  Gen: NAD, alert, cooperative with exam HEENT: NCAT, E tenderness to palpation of bilateral maxillary or frontal sinuses, nares with erythema and swollen boggy mucosa with enlarged turbinates, TMs normal bilaterally with some erythema developing on the right, oropharynx clear and moist CV: RRR, good S1/S2, no murmur Resp: CTABL, no wheezes, non-labored Abd: SNTND, BS present, no guarding or organomegaly Ext: No edema, warm Neuro: Alert and oriented, No gross deficits  Rapid flu: negative  Assessment and plan:  #Acute maxillary sinusitis Patient with severe sudden onset seasonal allergies and allergic rhinitis that I feel is leading to sinusitis. Treat with IM Depo-Medrol plus Augmentin   Orders Placed This Encounter  Procedures  . Veritor Flu A/B Waived    Order Specific Question:   Source    Answer:   nasopharynx    Meds ordered this encounter  Medications  . amoxicillin-clavulanate (AUGMENTIN) 875-125 MG tablet    Sig: Take 1 tablet by mouth 2 (two) times daily.    Dispense:  20 tablet    Refill:  0  . methylPREDNISolone acetate (DEPO-MEDROL) injection 80 mg    Laroy Apple, MD [facility] 10/30/2017,  9:02 AM  Patient has obstructive sleep apnea, he does well with his CPAP, however needs a new machine due to his old machine being greater than 56 years old and getting the message that the motor is past its usage time.  We will support her request for new machine.  Laroy Apple, MD [facility] 11/01/2017, 11:42 AM

## 2017-10-30 NOTE — Patient Instructions (Signed)
Great to see you!   Sinusitis, Adult Sinusitis is soreness and inflammation of your sinuses. Sinuses are hollow spaces in the bones around your face. They are located:  Around your eyes.  In the middle of your forehead.  Behind your nose.  In your cheekbones.  Your sinuses and nasal passages are lined with a stringy fluid (mucus). Mucus normally drains out of your sinuses. When your nasal tissues get inflamed or swollen, the mucus can get trapped or blocked so air cannot flow through your sinuses. This lets bacteria, viruses, and funguses grow, and that leads to infection. Follow these instructions at home: Medicines  Take, use, or apply over-the-counter and prescription medicines only as told by your doctor. These may include nasal sprays.  If you were prescribed an antibiotic medicine, take it as told by your doctor. Do not stop taking the antibiotic even if you start to feel better. Hydrate and Humidify  Drink enough water to keep your pee (urine) clear or pale yellow.  Use a cool mist humidifier to keep the humidity level in your home above 50%.  Breathe in steam for 10-15 minutes, 3-4 times a day or as told by your doctor. You can do this in the bathroom while a hot shower is running.  Try not to spend time in cool or dry air. Rest  Rest as much as possible.  Sleep with your head raised (elevated).  Make sure to get enough sleep each night. General instructions  Put a warm, moist washcloth on your face 3-4 times a day or as told by your doctor. This will help with discomfort.  Wash your hands often with soap and water. If there is no soap and water, use hand sanitizer.  Do not smoke. Avoid being around people who are smoking (secondhand smoke).  Keep all follow-up visits as told by your doctor. This is important. Contact a doctor if:  You have a fever.  Your symptoms get worse.  Your symptoms do not get better within 10 days. Get help right away if:  You  have a very bad headache.  You cannot stop throwing up (vomiting).  You have pain or swelling around your face or eyes.  You have trouble seeing.  You feel confused.  Your neck is stiff.  You have trouble breathing. This information is not intended to replace advice given to you by your health care provider. Make sure you discuss any questions you have with your health care provider. Document Released: 01/03/2008 Document Revised: 03/12/2016 Document Reviewed: 05/12/2015 Elsevier Interactive Patient Education  2018 Elsevier Inc.  

## 2017-11-22 ENCOUNTER — Ambulatory Visit: Payer: BC Managed Care – PPO | Admitting: Family Medicine

## 2017-11-27 ENCOUNTER — Ambulatory Visit: Payer: BC Managed Care – PPO | Admitting: Family Medicine

## 2017-11-27 ENCOUNTER — Encounter: Payer: Self-pay | Admitting: Family Medicine

## 2017-11-27 VITALS — BP 117/69 | HR 73 | Temp 97.5°F | Ht 66.0 in | Wt 270.0 lb

## 2017-11-27 DIAGNOSIS — E119 Type 2 diabetes mellitus without complications: Secondary | ICD-10-CM | POA: Diagnosis not present

## 2017-11-27 LAB — BAYER DCA HB A1C WAIVED: HB A1C (BAYER DCA - WAIVED): 5.7 % (ref <7.0)

## 2017-11-27 NOTE — Progress Notes (Signed)
   HPI  Patient presents today here for follow-up diabetes.  Patient feels well, he is watching his diet carefully. He was ill in the interim and received an injection of Depo-Medrol.  He tolerates metformin well and reports good medication compliance. He does not check his blood sugars  PMH: Smoking status noted ROS: Per HPI  Objective: BP 117/69   Pulse 73   Temp (!) 97.5 F (36.4 C) (Oral)   Ht 5\' 6"  (1.676 m)   Wt 270 lb (122.5 kg)   BMI 43.58 kg/m  Gen: NAD, alert, cooperative with exam HEENT: NCAT, EOMI, PERRL S1/S2, no murmur Resp: CTABL, no wheezes, non-labored Ext: No edema, warm Neuro: Alert and oriented, No gross deficits  Diabetic Foot Exam - Simple   Simple Foot Form Diabetic Foot exam was performed with the following findings:  Yes 11/27/2017  8:23 AM  Visual Inspection No deformities, no ulcerations, no other skin breakdown bilaterally:  Yes Sensation Testing Intact to touch and monofilament testing bilaterally:  Yes Pulse Check Posterior Tibialis and Dorsalis pulse intact bilaterally:  Yes Comments       Assessment and plan:  #Type 2 diabetes Expect good control, patient has lost some weight since his last visit and made positive lifestyle changes Continue metformin only, consider adding Januvia if additional medications are needed. A1c pending Urine microalbumin today as well   Pt sees urology for Hypogonadism  Orders Placed This Encounter  Procedures  . Microalbumin / creatinine urine ratio  . Bayer [facility] Hb A1c Carney Bern, MD [facility] 11/27/2017, 8:23 AM

## 2017-11-27 NOTE — Patient Instructions (Signed)
Great to see you!  See Dettinger in 3 months for follow up diabetes

## 2017-11-28 LAB — MICROALBUMIN / CREATININE URINE RATIO
Creatinine, Urine: 239.5 mg/dL
MICROALB/CREAT RATIO: 10.6 mg/g{creat} (ref 0.0–30.0)
Microalbumin, Urine: 25.5 ug/mL

## 2018-01-03 ENCOUNTER — Ambulatory Visit: Payer: BC Managed Care – PPO | Admitting: Physician Assistant

## 2018-01-03 ENCOUNTER — Encounter: Payer: Self-pay | Admitting: Physician Assistant

## 2018-01-03 VITALS — BP 125/71 | HR 86 | Temp 97.7°F | Ht 66.0 in | Wt 267.4 lb

## 2018-01-03 DIAGNOSIS — M25562 Pain in left knee: Secondary | ICD-10-CM | POA: Diagnosis not present

## 2018-01-03 NOTE — Progress Notes (Signed)
  Subjective:     Patient ID: Steve Williams, male   DOB: 10-27-1955, 62 y.o.   MRN: [id number]  HPI Pt with swelling and pain to the L knee Sig hx of same He has prev been told by Ortho that he will need replacement in the future Original injury at age 50 with open mensicus repair Intermit issue since by with sig sx over the last several years Last injection was in Nov of last year Pt felt a pop to the knee while walking several days ago and has noted increase swelling and pain since Wearing brace to help with sx  Review of Systems  Constitutional: Negative.   Musculoskeletal: Positive for arthralgias, gait problem and joint swelling. Negative for myalgias.  Skin: Negative.        Objective:   Physical Exam  Constitutional: He appears well-developed and well-nourished.  Nursing note and vitals reviewed. Using cane to aide in ambulation Wearing brace to the L knee + effusion noted to the L knee No ecchy Well healed medial and lateral joint surgical scars + TTP medial joint line FROM of the knee No apprec crepitus + lateral laxity noted McMurray/Lachman neg Due to large effusion offered joint asp/injection Pt consented Area cleansed with betadine Under sterile technique removed ~ 10cc of blood tinged fluid Then inject 2ccMarc/2cc Kenalog Area cleansed and dressed     Assessment:     1. Acute pain of left knee        Plan:     Wound care reviewed S/S of infection reviewed Given hx of diabetes informed may see sl spike in BS reading Brace prn If sx continue f.u with ortho

## 2018-01-03 NOTE — Patient Instructions (Signed)
Knee Effusion Knee effusion means that you have extra fluid in your knee. This can cause pain. Your knee may be more difficult to bend and move. Follow these instructions at home:  Use crutches as told by your doctor.  Wear a knee brace as told by your doctor.  Apply ice to the swollen area: ? Put ice in a plastic bag. ? Place a towel between your skin and the bag. ? Leave the ice on for 20 minutes, 2-3 times per day.  Keep your knee raised (elevated) when you are sitting or lying down.  Take medicines only as told by your doctor.  Do any rehabilitation or strengthening exercises as told by your doctor.  Rest your knee as told by your doctor. You may start doing your normal activities again when your doctor says it is okay.  Keep all follow-up visits as told by your doctor. This is important. Contact a doctor if:  You continue to have pain in your knee. Get help right away if:  You have increased swelling or redness of your knee.  You have severe pain in your knee.  You have a fever. This information is not intended to replace advice given to you by your health care provider. Make sure you discuss any questions you have with your health care provider. Document Released: 08/19/2010 Document Revised: 12/23/2015 Document Reviewed: 03/02/2014 Elsevier Interactive Patient Education  2018 Elsevier Inc.  

## 2018-01-15 ENCOUNTER — Other Ambulatory Visit: Payer: Self-pay | Admitting: Family Medicine

## 2018-01-21 ENCOUNTER — Other Ambulatory Visit: Payer: Self-pay | Admitting: Family Medicine

## 2018-02-04 ENCOUNTER — Other Ambulatory Visit: Payer: Self-pay | Admitting: Family Medicine

## 2018-02-27 ENCOUNTER — Ambulatory Visit: Payer: BC Managed Care – PPO | Admitting: Family Medicine

## 2018-02-27 ENCOUNTER — Encounter: Payer: Self-pay | Admitting: Family Medicine

## 2018-02-27 VITALS — BP 125/68 | HR 62 | Temp 97.7°F | Ht 66.0 in | Wt 261.0 lb

## 2018-02-27 DIAGNOSIS — F39 Unspecified mood [affective] disorder: Secondary | ICD-10-CM | POA: Diagnosis not present

## 2018-02-27 DIAGNOSIS — E291 Testicular hypofunction: Secondary | ICD-10-CM | POA: Diagnosis not present

## 2018-02-27 DIAGNOSIS — E1159 Type 2 diabetes mellitus with other circulatory complications: Secondary | ICD-10-CM | POA: Diagnosis not present

## 2018-02-27 DIAGNOSIS — E1169 Type 2 diabetes mellitus with other specified complication: Secondary | ICD-10-CM

## 2018-02-27 DIAGNOSIS — E785 Hyperlipidemia, unspecified: Secondary | ICD-10-CM

## 2018-02-27 DIAGNOSIS — I10 Essential (primary) hypertension: Secondary | ICD-10-CM

## 2018-02-27 DIAGNOSIS — L03011 Cellulitis of right finger: Secondary | ICD-10-CM

## 2018-02-27 LAB — BAYER DCA HB A1C WAIVED: HB A1C: 5.4 % (ref <7.0)

## 2018-02-27 MED ORDER — SIMVASTATIN 40 MG PO TABS: 40.0000 mg | ORAL_TABLET | Freq: Every day | ORAL | 3 refills | Status: DC

## 2018-02-27 MED ORDER — CEPHALEXIN 500 MG PO CAPS: 500.0000 mg | ORAL_CAPSULE | Freq: Four times a day (QID) | ORAL | 0 refills | Status: DC

## 2018-02-27 MED ORDER — LOSARTAN POTASSIUM-HCTZ 100-25 MG PO TABS: 1.0000 | ORAL_TABLET | Freq: Every day | ORAL | 3 refills | Status: DC

## 2018-02-27 MED ORDER — SERTRALINE HCL 100 MG PO TABS: 100.0000 mg | ORAL_TABLET | Freq: Every day | ORAL | 3 refills | Status: DC

## 2018-02-27 MED ORDER — MONTELUKAST SODIUM 10 MG PO TABS: 10.0000 mg | ORAL_TABLET | Freq: Every day | ORAL | 3 refills | Status: DC

## 2018-02-27 MED ORDER — SILDENAFIL CITRATE 20 MG PO TABS: ORAL_TABLET | ORAL | 5 refills | Status: DC

## 2018-02-27 NOTE — Progress Notes (Addendum)
BP 125/68   Pulse 62   Temp 97.7 F (36.5 C) (Oral)   Ht _0  (1.676 m)   Wt 261 lb (118.4 kg)   BMI 42.13 kg/m    Subjective:    Patient ID: Steve Williams, male    DOB: [birth year], 62 y.o.   MRN: [id number]  HPI: Steve Williams is a 62 y.o. male presenting on 02/27/2018 for Diabetes (3 month follow up) and Hypertension   HPI Type 2 diabetes mellitus Patient comes in today for recheck of his diabetes. Patient has been currently taking metformin. Patient is currently on an ACE inhibitor/ARB. Patient has not seen an ophthalmologist this year. Patient denies any issues with their feet.  Patient has been losing weight and if he continues then we will stop the metformin, his last A1c is 5.7, if similar then we will stop metformin today.  Hypertension Patient is currently on losartan-hydrochlorothiazide, and their blood pressure today is 125/68. Patient denies any lightheadedness or dizziness. Patient denies headaches, blurred vision, chest pains, shortness of breath, or weakness. Denies any side effects from medication and is content with current medication.  Will consider cutting back and just giving losartan without hydrochlorothiazide if patient continues to lose weight and do well.  Hyperlipidemia Patient is coming in for recheck of his hyperlipidemia. The patient is currently taking simvastatin. They deny any issues with myalgias or history of liver damage from it. They deny any focal numbness or weakness or chest pain.   Mood disorder Patient says today he is doing perfectly great he likes Zoloft and he likes continue with that and says that he has not had any issues since his been on the Zoloft and would like to continue it.  He denies any suicidal ideations or thoughts of hurting himself.  Depression screen [facility] 2/9 02/27/2018 11/27/2017 10/30/2017 08/20/2017 06/27/2017  Decreased Interest 0 0 0 0 0  Down, Depressed, Hopeless 0 0 0 0 0  PHQ - 2 Score 0 0 0 0 0    Patient has been  diagnosed with hypogonadism but he says he gets testosterone through his urologist and he follows up with them and he is going to call for an appointment with them.  Patient does have a hangnail infection on his right index finger that is been going on for about a month, he says he was biting on it and then it got infected and since then and has been swelling more, he did have some drainage out of it just last week.  He says it has been getting worse slowly.  Relevant past medical, surgical, family and social history reviewed and updated as indicated. Interim medical history since our last visit reviewed. Allergies and medications reviewed and updated.  Review of Systems  Constitutional: Negative for chills and fever.  Eyes: Negative for discharge.  Respiratory: Negative for shortness of breath and wheezing.   Cardiovascular: Negative for chest pain and leg swelling.  Musculoskeletal: Negative for back pain and gait problem.  Skin: Positive for color change. Negative for rash.  Neurological: Negative for dizziness, weakness, light-headedness and headaches.  All other systems reviewed and are negative.   Per HPI unless specifically indicated above   Allergies as of 02/27/2018      Reactions   Lisinopril    Erythromycin Rash      Medication List        Accurate as of 02/27/18  8:35 AM. Always use your most recent med list.  aspirin 81 MG tablet Take 81 mg by mouth daily.   fluticasone 50 MCG/ACT nasal spray Commonly known as:  FLONASE Place into both nostrils daily.   losartan-hydrochlorothiazide 100-25 MG tablet Commonly known as:  HYZAAR Take 1 tablet by mouth daily.   metFORMIN 500 MG 24 hr tablet Commonly known as:  GLUCOPHAGE-XR TAKE 1 TABLET TWICE A DAY   montelukast 10 MG tablet Commonly known as:  SINGULAIR Take 1 tablet (10 mg total) by mouth at bedtime.   sertraline 100 MG tablet Commonly known as:  ZOLOFT Take 1 tablet (100 mg total) by mouth  daily.   sildenafil 20 MG tablet Commonly known as:  REVATIO TAKE 1-5 PILLS BY MOUTH ONCE DAILY AS NEEDED FOR ERECTILE DYSFUNCTION   simvastatin 40 MG tablet Commonly known as:  ZOCOR Take 1 tablet (40 mg total) by mouth daily.   testosterone cypionate 200 MG/ML injection Commonly known as:  DEPOTESTOSTERONE CYPIONATE Inject 2 mLs (400 mg total) into the muscle every 28 (twenty-eight) days.          Objective:    BP 125/68   Pulse 62   Temp 97.7 F (36.5 C) (Oral)   Ht _0  (1.676 m)   Wt 261 lb (118.4 kg)   BMI 42.13 kg/m   Wt Readings from Last 3 Encounters:  02/27/18 261 lb (118.4 kg)  01/03/18 267 lb 6 oz (121.3 kg)  11/27/17 270 lb (122.5 kg)    Physical Exam  Constitutional: He is oriented to person, place, and time. He appears well-developed and well-nourished. No distress.  Eyes: Conjunctivae are normal. No scleral icterus.  Neck: Neck supple. No thyromegaly present.  Cardiovascular: Normal rate, regular rhythm, normal heart sounds and intact distal pulses.  No murmur heard. Pulmonary/Chest: Effort normal and breath sounds normal. No respiratory distress. He has no wheezes.  Musculoskeletal: Normal range of motion. He exhibits no edema.  Lymphadenopathy:    He has no cervical adenopathy.  Neurological: He is alert and oriented to person, place, and time. Coordination normal.  Skin: Skin is warm and dry. No rash noted. He is not diaphoretic. There is erythema (Erythema and slight swelling on the skin surrounding his right index finger nail).  Psychiatric: He has a normal mood and affect. His behavior is normal.  Nursing note and vitals reviewed.      Assessment & Plan:   Problem List Items Addressed This Visit      Cardiovascular and Mediastinum   Hypertension associated with diabetes ([place])   Relevant Medications   losartan-hydrochlorothiazide (HYZAAR) 100-25 MG tablet   sildenafil (REVATIO) 20 MG tablet   simvastatin (ZOCOR) 40 MG tablet      Endocrine   DM2 (diabetes mellitus, type 2) (HCC) - Primary   Relevant Medications   losartan-hydrochlorothiazide (HYZAAR) 100-25 MG tablet   simvastatin (ZOCOR) 40 MG tablet   Other Relevant Orders   Bayer DCA Hb A1c Waived   CMP14+EGFR   Hypogonadism in male   Relevant Orders   CBC with Differential/Platelet   Hyperlipidemia associated with type 2 diabetes mellitus (HCC)   Relevant Medications   losartan-hydrochlorothiazide (HYZAAR) 100-25 MG tablet   sildenafil (REVATIO) 20 MG tablet   simvastatin (ZOCOR) 40 MG tablet   Other Relevant Orders   Lipid panel     Other   Mood disorder ([place])   Morbid obesity ([place])    Other Visit Diagnoses    Paronychia of finger of right hand       Relevant Medications  cephALEXin (KEFLEX) 500 MG capsule       Follow up plan: Return in about 3 months (around 05/30/2018), or if symptoms worsen or fail to improve, for Diabetes and hypertension and cholesterol recheck.  Counseling provided for all of the vaccine components Orders Placed This Encounter  Procedures  . Bayer DCA Hb A1c Waived  . CBC with Differential/Platelet  . CMP14+EGFR  . Lipid panel    Caryl Pina, MD [facility] 02/27/2018, 8:35 AM

## 2018-02-27 NOTE — Addendum Note (Signed)
Addended by: Caryl Pina on: 02/27/2018 08:43 AM   Modules accepted: Orders

## 2018-02-28 LAB — CBC WITH DIFFERENTIAL/PLATELET
BASOS ABS: 0.1 10*3/uL (ref 0.0–0.2)
Basos: 1 %
EOS (ABSOLUTE): 0.1 10*3/uL (ref 0.0–0.4)
Eos: 1 %
HEMOGLOBIN: 17.3 g/dL (ref 13.0–17.7)
Hematocrit: 51.5 % — ABNORMAL HIGH (ref 37.5–51.0)
IMMATURE GRANULOCYTES: 0 %
Immature Grans (Abs): 0 10*3/uL (ref 0.0–0.1)
LYMPHS: 27 %
Lymphocytes Absolute: 1.9 10*3/uL (ref 0.7–3.1)
MCH: 30.1 pg (ref 26.6–33.0)
MCHC: 33.6 g/dL (ref 31.5–35.7)
MCV: 90 fL (ref 79–97)
MONOCYTES: 7 %
Monocytes Absolute: 0.5 10*3/uL (ref 0.1–0.9)
NEUTROS PCT: 64 %
Neutrophils Absolute: 4.5 10*3/uL (ref 1.4–7.0)
PLATELETS: 122 10*3/uL — AB (ref 150–450)
RBC: 5.75 x10E6/uL (ref 4.14–5.80)
RDW: 14 % (ref 12.3–15.4)
WBC: 7.1 10*3/uL (ref 3.4–10.8)

## 2018-02-28 LAB — CMP14+EGFR
ALBUMIN: 4.5 g/dL (ref 3.6–4.8)
ALT: 15 IU/L (ref 0–44)
AST: 12 IU/L (ref 0–40)
Albumin/Globulin Ratio: 2.3 — ABNORMAL HIGH (ref 1.2–2.2)
Alkaline Phosphatase: 72 IU/L (ref 39–117)
BUN / CREAT RATIO: 20 (ref 10–24)
BUN: 14 mg/dL (ref 8–27)
Bilirubin Total: 0.4 mg/dL (ref 0.0–1.2)
CHLORIDE: 101 mmol/L (ref 96–106)
CO2: 19 mmol/L — AB (ref 20–29)
CREATININE: 0.71 mg/dL — AB (ref 0.76–1.27)
Calcium: 9.4 mg/dL (ref 8.6–10.2)
GFR calc non Af Amer: 100 mL/min/{1.73_m2} (ref >59)
GFR, EST AFRICAN AMERICAN: 115 mL/min/{1.73_m2} (ref >59)
GLUCOSE: 119 mg/dL — AB (ref 65–99)
Globulin, Total: 2 g/dL (ref 1.5–4.5)
POTASSIUM: 3.8 mmol/L (ref 3.5–5.2)
Sodium: 141 mmol/L (ref 134–144)
TOTAL PROTEIN: 6.5 g/dL (ref 6.0–8.5)

## 2018-02-28 LAB — LIPID PANEL
CHOLESTEROL TOTAL: 117 mg/dL (ref 100–199)
Chol/HDL Ratio: 3.2 ratio (ref 0.0–5.0)
HDL: 37 mg/dL — AB (ref >39)
LDL Calculated: 47 mg/dL (ref 0–99)
Triglycerides: 167 mg/dL — ABNORMAL HIGH (ref 0–149)
VLDL Cholesterol Cal: 33 mg/dL (ref 5–40)

## 2018-04-02 ENCOUNTER — Ambulatory Visit: Payer: BC Managed Care – PPO | Admitting: Urology

## 2018-04-02 DIAGNOSIS — E291 Testicular hypofunction: Secondary | ICD-10-CM | POA: Diagnosis not present

## 2018-04-02 DIAGNOSIS — R3915 Urgency of urination: Secondary | ICD-10-CM

## 2018-04-02 DIAGNOSIS — N5201 Erectile dysfunction due to arterial insufficiency: Secondary | ICD-10-CM

## 2018-05-20 ENCOUNTER — Telehealth: Payer: Self-pay | Admitting: Family Medicine

## 2018-05-20 NOTE — Telephone Encounter (Signed)
Cathy or Dr. Warrick Parisian  have you seen anything from Dr. Glenetta Borg office about him giving the patient an anti inflammatory

## 2018-05-20 NOTE — Telephone Encounter (Signed)
I cannot say that I have or if I have I cannot recall.  Today recommend a specific anti-inflammatory or a topical or oral, does not know the name

## 2018-05-20 NOTE — Telephone Encounter (Signed)
Wants to speak about anitinflammotory med his chiroprator states he needs to be on.

## 2018-05-21 NOTE — Telephone Encounter (Signed)
Aware.  No documentation found from Dr. Bethann Goo.  Patient upset about that and says he guesses he will have to just suffer until appointment time.

## 2018-05-30 ENCOUNTER — Ambulatory Visit: Payer: BC Managed Care – PPO | Admitting: Family Medicine

## 2018-05-30 ENCOUNTER — Encounter: Payer: Self-pay | Admitting: Family Medicine

## 2018-05-30 VITALS — BP 126/68 | HR 64 | Temp 97.2°F | Ht 66.0 in | Wt 257.0 lb

## 2018-05-30 DIAGNOSIS — I152 Hypertension secondary to endocrine disorders: Secondary | ICD-10-CM

## 2018-05-30 DIAGNOSIS — E291 Testicular hypofunction: Secondary | ICD-10-CM

## 2018-05-30 DIAGNOSIS — I10 Essential (primary) hypertension: Secondary | ICD-10-CM

## 2018-05-30 DIAGNOSIS — E1169 Type 2 diabetes mellitus with other specified complication: Secondary | ICD-10-CM | POA: Diagnosis not present

## 2018-05-30 DIAGNOSIS — E1159 Type 2 diabetes mellitus with other circulatory complications: Secondary | ICD-10-CM | POA: Diagnosis not present

## 2018-05-30 DIAGNOSIS — E785 Hyperlipidemia, unspecified: Secondary | ICD-10-CM

## 2018-05-30 LAB — BAYER DCA HB A1C WAIVED: HB A1C (BAYER DCA - WAIVED): 5.6 % (ref <7.0)

## 2018-05-30 MED ORDER — TESTOSTERONE CYPIONATE 200 MG/ML IM SOLN: 400.0000 mg | INTRAMUSCULAR | 0 refills | Status: DC

## 2018-05-30 MED ORDER — MELOXICAM 15 MG PO TABS: 15.0000 mg | ORAL_TABLET | Freq: Every day | ORAL | 5 refills | Status: DC

## 2018-05-30 NOTE — Progress Notes (Signed)
BP 126/68   Pulse 64   Temp (!) 97.2 F (36.2 C) (Oral)   Ht 5' 6"  (1.676 m)   Wt 257 lb (116.6 kg)   BMI 41.48 kg/m    Subjective:    Patient ID: Steve Williams, male    DOB: 09/26/1955, 62 y.o.   MRN: [id number]  HPI: Steve Williams is a 62 y.o. male presenting on 05/30/2018 for Diabetes (3 month follow up)   HPI Type 2 diabetes mellitus Patient comes in today for recheck of his diabetes. Patient has been currently taking no medication and has been diet controlled and his last A1c was 5.4, he has stopped metformin at that time. Patient is currently on an ACE inhibitor/ARB. Patient has seen an ophthalmologist this year. Patient denies any issues with their feet.   Hypertension Patient is currently on losartan-hydrochlorothiazide, and their blood pressure today is 126/68. Patient denies any lightheadedness or dizziness. Patient denies headaches, blurred vision, chest pains, shortness of breath, or weakness. Denies any side effects from medication and is content with current medication.   Hyperlipidemia Patient is coming in for recheck of his hyperlipidemia. The patient is currently taking simvastatin. They deny any issues with myalgias or history of liver damage from it. They deny any focal numbness or weakness or chest pain.   Patient takes testosterone for hypogonadism and continues to do well on it and needs a refill for it.  He does not use it all the time.  Relevant past medical, surgical, family and social history reviewed and updated as indicated. Interim medical history since our last visit reviewed. Allergies and medications reviewed and updated.  Review of Systems  Constitutional: Negative for chills and fever.  Eyes: Negative for visual disturbance.  Respiratory: Negative for shortness of breath and wheezing.   Cardiovascular: Negative for chest pain and leg swelling.  Musculoskeletal: Negative for back pain and gait problem.  Skin: Negative for rash.  Neurological:  Negative for dizziness, weakness, light-headedness and numbness.  All other systems reviewed and are negative.   Per HPI unless specifically indicated above   Allergies as of 05/30/2018      Reactions   Lisinopril    Erythromycin Rash      Medication List        Accurate as of 05/30/18  8:27 AM. Always use your most recent med list.          aspirin 81 MG tablet Take 81 mg by mouth daily.   fluticasone 50 MCG/ACT nasal spray Commonly known as:  FLONASE Place into both nostrils daily.   losartan-hydrochlorothiazide 100-25 MG tablet Commonly known as:  HYZAAR Take 1 tablet by mouth daily.   meloxicam 15 MG tablet Commonly known as:  MOBIC Take 1 tablet (15 mg total) by mouth daily.   montelukast 10 MG tablet Commonly known as:  SINGULAIR Take 1 tablet (10 mg total) by mouth at bedtime.   sertraline 100 MG tablet Commonly known as:  ZOLOFT Take 1 tablet (100 mg total) by mouth daily.   sildenafil 20 MG tablet Commonly known as:  REVATIO TAKE 1-5 PILLS BY MOUTH ONCE DAILY AS NEEDED FOR ERECTILE DYSFUNCTION   simvastatin 40 MG tablet Commonly known as:  ZOCOR Take 1 tablet (40 mg total) by mouth daily.   testosterone cypionate 200 MG/ML injection Commonly known as:  DEPOTESTOSTERONE CYPIONATE Inject 2 mLs (400 mg total) into the muscle every 28 (twenty-eight) days.          Objective:  BP 126/68   Pulse 64   Temp (!) 97.2 F (36.2 C) (Oral)   Ht 5' 6"  (1.676 m)   Wt 257 lb (116.6 kg)   BMI 41.48 kg/m   Wt Readings from Last 3 Encounters:  05/30/18 257 lb (116.6 kg)  02/27/18 261 lb (118.4 kg)  01/03/18 267 lb 6 oz (121.3 kg)    Physical Exam  Constitutional: He is oriented to person, place, and time. He appears well-developed and well-nourished. No distress.  Eyes: Conjunctivae are normal. No scleral icterus.  Neck: Neck supple. No thyromegaly present.  Cardiovascular: Normal rate, regular rhythm, normal heart sounds and intact distal  pulses.  No murmur heard. Pulmonary/Chest: Effort normal and breath sounds normal. No respiratory distress. He has no wheezes.  Musculoskeletal: Normal range of motion. He exhibits no edema.  Lymphadenopathy:    He has no cervical adenopathy.  Neurological: He is alert and oriented to person, place, and time. Coordination normal.  Skin: Skin is warm and dry. No rash noted. He is not diaphoretic.  Psychiatric: He has a normal mood and affect. His behavior is normal.  Nursing note and vitals reviewed.   Results for orders placed or performed in visit on 02/27/18  Bayer DCA Hb A1c Waived  Result Value Ref Range   HB A1C (BAYER DCA - WAIVED) 5.4 <7.0 %  CBC with Differential/Platelet  Result Value Ref Range   WBC 7.1 3.4 - 10.8 x10E3/uL   RBC 5.75 4.14 - 5.80 x10E6/uL   Hemoglobin 17.3 13.0 - 17.7 g/dL   Hematocrit 51.5 (H) 37.5 - 51.0 %   MCV 90 79 - 97 fL   MCH 30.1 26.6 - 33.0 pg   MCHC 33.6 31.5 - 35.7 g/dL   RDW 14.0 12.3 - 15.4 %   Platelets 122 (L) 150 - 450 x10E3/uL   Neutrophils 64 Not Estab. %   Lymphs 27 Not Estab. %   Monocytes 7 Not Estab. %   Eos 1 Not Estab. %   Basos 1 Not Estab. %   Neutrophils Absolute 4.5 1.4 - 7.0 x10E3/uL   Lymphocytes Absolute 1.9 0.7 - 3.1 x10E3/uL   Monocytes Absolute 0.5 0.1 - 0.9 x10E3/uL   EOS (ABSOLUTE) 0.1 0.0 - 0.4 x10E3/uL   Basophils Absolute 0.1 0.0 - 0.2 x10E3/uL   Immature Granulocytes 0 Not Estab. %   Immature Grans (Abs) 0.0 0.0 - 0.1 x10E3/uL  CMP14+EGFR  Result Value Ref Range   Glucose 119 (H) 65 - 99 mg/dL   BUN 14 8 - 27 mg/dL   Creatinine, Ser 0.71 (L) 0.76 - 1.27 mg/dL   GFR calc non Af Amer 100 >59 mL/min/1.73   GFR calc Af Amer 115 >59 mL/min/1.73   BUN/Creatinine Ratio 20 10 - 24   Sodium 141 134 - 144 mmol/L   Potassium 3.8 3.5 - 5.2 mmol/L   Chloride 101 96 - 106 mmol/L   CO2 19 (L) 20 - 29 mmol/L   Calcium 9.4 8.6 - 10.2 mg/dL   Total Protein 6.5 6.0 - 8.5 g/dL   Albumin 4.5 3.6 - 4.8 g/dL   Globulin,  Total 2.0 1.5 - 4.5 g/dL   Albumin/Globulin Ratio 2.3 (H) 1.2 - 2.2   Bilirubin Total 0.4 0.0 - 1.2 mg/dL   Alkaline Phosphatase 72 39 - 117 IU/L   AST 12 0 - 40 IU/L   ALT 15 0 - 44 IU/L  Lipid panel  Result Value Ref Range   Cholesterol, Total 117 100 -  199 mg/dL   Triglycerides 167 (H) 0 - 149 mg/dL   HDL 37 (L) >39 mg/dL   VLDL Cholesterol Cal 33 5 - 40 mg/dL   LDL Calculated 47 0 - 99 mg/dL   Chol/HDL Ratio 3.2 0.0 - 5.0 ratio      Assessment & Plan:   Problem List Items Addressed This Visit      Cardiovascular and Mediastinum   Hypertension associated with diabetes ([place])     Endocrine   DM2 (diabetes mellitus, type 2) ([place]) - Primary   Relevant Orders   Bayer DCA Hb A1c Waived   Hypogonadism in male   Relevant Medications   testosterone cypionate (DEPOTESTOSTERONE CYPIONATE) 200 MG/ML injection   Hyperlipidemia associated with type 2 diabetes mellitus ([place])     Other   Morbid obesity ([place])       Follow up plan: Return if symptoms worsen or fail to improve, for Come back in 3 to 6 months depending on A1c result.  Counseling provided for all of the vaccine components Orders Placed This Encounter  Procedures  . Bayer [facility] Hb A1c Waived    Caryl Pina, MD [facility] 05/30/2018, 8:27 AM

## 2018-07-22 ENCOUNTER — Ambulatory Visit: Payer: BC Managed Care – PPO | Admitting: Family

## 2018-07-22 ENCOUNTER — Encounter: Payer: Self-pay | Admitting: Family

## 2018-07-22 VITALS — BP 126/71 | HR 75 | Ht 66.0 in | Wt 257.0 lb

## 2018-07-22 DIAGNOSIS — J209 Acute bronchitis, unspecified: Secondary | ICD-10-CM | POA: Diagnosis not present

## 2018-07-22 MED ORDER — METHYLPREDNISOLONE ACETATE 80 MG/ML IJ SUSP
80.0000 mg | Freq: Once | INTRAMUSCULAR | Status: AC
Start: 2018-07-22 — End: 2018-07-22
  Administered 2018-07-22: 80 mg via INTRAMUSCULAR

## 2018-07-22 MED ORDER — PREDNISONE 10 MG (21) PO TBPK: ORAL_TABLET | ORAL | 0 refills | Status: DC

## 2018-07-22 MED ORDER — DOXYCYCLINE HYCLATE 100 MG PO TABS
100.0000 mg | ORAL_TABLET | Freq: Two times a day (BID) | ORAL | 0 refills | Status: DC
Start: 2018-07-22 — End: 2019-03-04

## 2018-07-22 MED ORDER — HYDROCODONE-HOMATROPINE 5-1.5 MG/5ML PO SYRP: 5.0000 mL | ORAL_SOLUTION | Freq: Three times a day (TID) | ORAL | 0 refills | Status: DC | PRN

## 2018-07-22 MED ORDER — BENZONATATE 200 MG PO CAPS: 200.0000 mg | ORAL_CAPSULE | Freq: Three times a day (TID) | ORAL | 1 refills | Status: DC | PRN

## 2018-07-22 NOTE — Progress Notes (Signed)
Subjective:    Patient ID: Steve Williams, male    DOB: May 27, 1956, 62 y.o.   MRN: [id number]  Chief Complaint  Patient presents with  . Cough    Cough  This is a new problem. The current episode started 1 to 4 weeks ago. The problem has been gradually worsening. The problem occurs every few minutes. The cough is non-productive. Associated symptoms include nasal congestion and postnasal drip. Pertinent negatives include no chills, ear congestion, ear pain, fever, headaches, myalgias, rhinorrhea, sore throat, shortness of breath or wheezing. The symptoms are aggravated by lying down. Steve Williams has tried rest and OTC cough suppressant (Levaquin) for the symptoms. The treatment provided mild relief. There is no history of asthma or COPD.      Review of Systems  Constitutional: Negative for chills and fever.  HENT: Positive for postnasal drip. Negative for ear pain, rhinorrhea and sore throat.   Respiratory: Positive for cough. Negative for shortness of breath and wheezing.   Musculoskeletal: Negative for myalgias.  Neurological: Negative for headaches.  All other systems reviewed and are negative.      Objective:   Physical Exam Vitals signs reviewed.  Constitutional:      General: Steve Williams is not in acute distress.    Appearance: Steve Williams is well-developed.  HENT:     Head: Normocephalic.     Nose:     Right Turbinates: Enlarged.     Left Turbinates: Enlarged.     Mouth/Throat:     Pharynx: Posterior oropharyngeal erythema present.  Eyes:     General:        Right eye: No discharge.        Left eye: No discharge.     Pupils: Pupils are equal, round, and reactive to light.  Neck:     Musculoskeletal: Normal range of motion and neck supple.     Thyroid: No thyromegaly.  Cardiovascular:     Rate and Rhythm: Normal rate and regular rhythm.     Heart sounds: Normal heart sounds. No murmur.  Pulmonary:     Effort: Pulmonary effort is normal. No respiratory distress.     Breath sounds: Normal  breath sounds. No wheezing.     Comments: Intermittent nonproductive cough Abdominal:     General: Bowel sounds are normal. There is no distension.     Palpations: Abdomen is soft.     Tenderness: There is no abdominal tenderness.  Musculoskeletal: Normal range of motion.        General: No tenderness.  Skin:    General: Skin is warm and dry.     Findings: No erythema or rash.  Neurological:     Mental Status: Steve Williams is alert and oriented to person, place, and time.     Cranial Nerves: No cranial nerve deficit.     Deep Tendon Reflexes: Reflexes are normal and symmetric.  Psychiatric:        Behavior: Behavior normal.        Thought Content: Thought content normal.        Judgment: Judgment normal.       BP 126/71   Pulse 75   Ht 5\' 6"  (1.676 m)   Wt 257 lb (116.6 kg)   BMI 41.48 kg/m      Assessment & Plan:  Steve Williams comes in today with chief complaint of Cough   Diagnosis and orders addressed:  1. Acute bronchitis, unspecified organism - Take meds as prescribed - Use a cool mist  humidifier  -Use saline nose sprays frequently -Force fluids -For any cough or congestion  Use plain Mucinex- regular strength or max strength is fine -For fever or aces or pains- take tylenol or ibuprofen. -Throat lozenges if help -Since we will be closed for the next 2 days, I have sent in a rx of doxycycline for him to start if symptoms worsen or Steve Williams develops a fever.  Strict low carb diet  RTO if symptoms worsen or do not improve  - predniSONE (STERAPRED UNI-PAK 21 TAB) 10 MG (21) TBPK tablet; Use as directed  Dispense: 21 tablet; Refill: 0 - benzonatate (TESSALON) 200 MG capsule; Take 1 capsule (200 mg total) by mouth 3 (three) times daily as needed.  Dispense: 30 capsule; Refill: 1 - HYDROcodone-homatropine (HYCODAN) 5-1.5 MG/5ML syrup; Take 5 mLs by mouth every 8 (eight) hours as needed for cough.  Dispense: 120 mL; Refill: 0 - methylPREDNISolone acetate (DEPO-MEDROL) injection 80  mg   Evelina Dun, FNP

## 2018-07-22 NOTE — Patient Instructions (Signed)

## 2018-07-26 ENCOUNTER — Ambulatory Visit: Payer: BC Managed Care – PPO | Admitting: Pediatrics

## 2018-07-26 ENCOUNTER — Encounter: Payer: Self-pay | Admitting: Pediatrics

## 2018-07-26 VITALS — BP 131/72 | HR 64 | Temp 98.2°F | Resp 20 | Ht 66.0 in | Wt 256.4 lb

## 2018-07-26 DIAGNOSIS — J069 Acute upper respiratory infection, unspecified: Secondary | ICD-10-CM

## 2018-07-26 NOTE — Progress Notes (Signed)
  Subjective:   Patient ID: Steve Williams, male    DOB: Jan 11, 1956, 62 y.o.   MRN: [id number] CC: Cough and Chest congestion  HPI: TARENCE SEARCY is a 62 y.o. male   Still coughing, temp up to 99, 99.5 last night. Started on doxycycline for bronchitis 5 days ago, he is still taking it.Marland Kitchen  He does not feel any worse than when he started getting sick, still not feeling back to normal self.  He thinks he has been sick total for about a week.  Appetite has been fine.  Relevant past medical, surgical, family and social history reviewed. Allergies and medications reviewed and updated. Social History   Tobacco Use  Smoking Status Never Smoker  Smokeless Tobacco Never Used   ROS: Per HPI   Objective:    BP 131/72   Pulse 64   Temp 98.2 F (36.8 C) (Oral)   Resp 20   Ht 5\' 6"  (1.676 m)   Wt 256 lb 6.4 oz (116.3 kg)   SpO2 98%   BMI 41.38 kg/m   Wt Readings from Last 3 Encounters:  07/26/18 256 lb 6.4 oz (116.3 kg)  07/22/18 257 lb (116.6 kg)  05/30/18 257 lb (116.6 kg)    Gen: NAD, alert, cooperative with exam, NCAT EYES: EOMI, no conjunctival injection, or no icterus ENT:  TMs dull gray b/l, OP without erythema LYMPH: no cervical LAD CV: NRRR, normal S1/S2, no murmur, distal pulses 2+ b/l Resp: CTABL, no wheezes, normal WOB Ext: No edema, warm Neuro: Alert and oriented MSK: normal muscle bulk  Assessment & Plan:  Dimitrius was seen today for cough and chest congestion.  Diagnoses and all orders for this visit:  Acute URI Continue current medicines.  Symptom care, return precautions discussed.  Follow up plan: Return if symptoms worsen or fail to improve. Assunta Found, MD [place]

## 2018-07-26 NOTE — Patient Instructions (Signed)
Fever reducer and headache: tylenol   Sinus pressure:  Nasal steroid such as flonase/fluticaone or nasocort daily Can also take daily antihistamine such as loratadine/claritin or cetirizine/zyrtec  Sinus rinses/irritation: Net[place] or similar with distilled water 2-3 times a day to clear out sinuses or Normal saline nasal spray  Sore throat:  Throat lozenges chloroseptic spray  Stick with bland foods Drink lots of fluids  

## 2018-11-13 ENCOUNTER — Other Ambulatory Visit: Payer: Self-pay | Admitting: Family Medicine

## 2018-11-13 DIAGNOSIS — E291 Testicular hypofunction: Secondary | ICD-10-CM

## 2018-11-28 ENCOUNTER — Ambulatory Visit (INDEPENDENT_AMBULATORY_CARE_PROVIDER_SITE_OTHER): Payer: BC Managed Care – PPO | Admitting: Family Medicine

## 2018-11-28 ENCOUNTER — Encounter: Payer: Self-pay | Admitting: Family Medicine

## 2018-11-28 ENCOUNTER — Other Ambulatory Visit: Payer: Self-pay

## 2018-11-28 DIAGNOSIS — E1159 Type 2 diabetes mellitus with other circulatory complications: Secondary | ICD-10-CM

## 2018-11-28 DIAGNOSIS — E291 Testicular hypofunction: Secondary | ICD-10-CM

## 2018-11-28 DIAGNOSIS — F39 Unspecified mood [affective] disorder: Secondary | ICD-10-CM

## 2018-11-28 DIAGNOSIS — E785 Hyperlipidemia, unspecified: Secondary | ICD-10-CM

## 2018-11-28 DIAGNOSIS — I10 Essential (primary) hypertension: Secondary | ICD-10-CM

## 2018-11-28 DIAGNOSIS — E1169 Type 2 diabetes mellitus with other specified complication: Secondary | ICD-10-CM | POA: Diagnosis not present

## 2018-11-28 MED ORDER — MELOXICAM 15 MG PO TABS: 15.0000 mg | ORAL_TABLET | Freq: Every day | ORAL | 3 refills | Status: DC

## 2018-11-28 MED ORDER — TESTOSTERONE CYPIONATE 200 MG/ML IM SOLN: 100.0000 mg | INTRAMUSCULAR | 0 refills | Status: DC

## 2018-11-28 NOTE — Progress Notes (Signed)
Virtual Visit via video Zoom note  I connected with [name] on 11/28/18 at 0802 by video and verified that I am speaking with the correct person using two identifiers. Steve Williams is currently located at home and no other people are currently with her during visit. The provider, Fransisca Kaufmann Dettinger, MD is located in their office at time of visit.  Call ended at [phone]  I discussed the limitations, risks, security and privacy concerns of performing an evaluation and management service by video and the availability of in person appointments. I also discussed with the patient that there may be a patient responsible charge related to this service. The patient expressed understanding and agreed to proceed.  Weight 245 pounds, BP unknown History and Present Illness: Type 2 diabetes mellitus Patient comes in today for recheck of his diabetes. Patient has been currently taking no medication. Patient is currently on an ACE inhibitor/ARB. Patient has not seen an ophthalmologist this year. Patient denies any issues with their feet.   Hypertension Patient is currently on losartan-hydrochlorathiazide, and their blood pressure today is unknown because his cuff did not work. Patient denies any lightheadedness or dizziness. Patient denies headaches, blurred vision, chest pains, shortness of breath, or weakness. Denies any side effects from medication and is content with current medication.   Mood disorder Patient is currently taking zoloft and feels like it is working well for him.  Patient denies any major anxiety or depression and he feels like he is doing well and is retired and pretty happy about life.  No diagnosis found.  Outpatient Encounter Medications as of 11/28/2018  Medication Sig  . aspirin 81 MG tablet Take 81 mg by mouth daily.  . benzonatate (TESSALON) 200 MG capsule Take 1 capsule (200 mg total) by mouth 3 (three) times daily as needed.  . doxycycline (VIBRA-TABS) 100 MG tablet Take  1 tablet (100 mg total) by mouth 2 (two) times daily.  . fluticasone (FLONASE) 50 MCG/ACT nasal spray Place into both nostrils daily.  Marland Kitchen HYDROcodone-homatropine (HYCODAN) 5-1.5 MG/5ML syrup Take 5 mLs by mouth every 8 (eight) hours as needed for cough.  . losartan-hydrochlorothiazide (HYZAAR) 100-25 MG tablet Take 1 tablet by mouth daily.  . meloxicam (MOBIC) 15 MG tablet Take 1 tablet (15 mg total) by mouth daily.  . montelukast (SINGULAIR) 10 MG tablet Take 1 tablet (10 mg total) by mouth at bedtime.  . predniSONE (STERAPRED UNI-PAK 21 TAB) 10 MG (21) TBPK tablet Use as directed  . sertraline (ZOLOFT) 100 MG tablet Take 1 tablet (100 mg total) by mouth daily.  . sildenafil (REVATIO) 20 MG tablet TAKE 1-5 PILLS BY MOUTH ONCE DAILY AS NEEDED FOR ERECTILE DYSFUNCTION  . simvastatin (ZOCOR) 40 MG tablet Take 1 tablet (40 mg total) by mouth daily.  Marland Kitchen testosterone cypionate (DEPOTESTOSTERONE CYPIONATE) 200 MG/ML injection Inject 0.5 mLs (100 mg total) into the muscle every 7 (seven) days for 30 days.   Facility-Administered Encounter Medications as of 11/28/2018  Medication  . testosterone cypionate (DEPOTESTOSTERONE CYPIONATE) injection 400 mg    Review of Systems  Constitutional: Negative for chills and fever.  Eyes: Negative for visual disturbance.  Respiratory: Negative for shortness of breath and wheezing.   Cardiovascular: Negative for chest pain and leg swelling.  Musculoskeletal: Negative for back pain and gait problem.  Skin: Negative for rash.  Neurological: Negative for dizziness, weakness and light-headedness.  All other systems reviewed and are negative.   Observations/Objective: Patient sounds comfortable and appears  to be comfortable on the video and in no acute distress  Assessment and Plan: Problem List Items Addressed This Visit      Cardiovascular and Mediastinum   Hypertension associated with diabetes ([place])     Endocrine   DM2 (diabetes mellitus, type 2) ([place]) -  Primary   Hypogonadism in male   Relevant Medications   testosterone cypionate (DEPOTESTOSTERONE CYPIONATE) 200 MG/ML injection   Hyperlipidemia associated with type 2 diabetes mellitus ([place])     Other   Mood disorder ([place])       Follow Up Instructions: Follow up in 6 months  Continue current medications and he will let me know on his blood pressure and we will decide based on where it is running if he can cut it in half or if it needs to be continued   I discussed the assessment and treatment plan with the patient. The patient was provided an opportunity to ask questions and all were answered. The patient agreed with the plan and demonstrated an understanding of the instructions.   The patient was advised to call back or seek an in-person evaluation if the symptoms worsen or if the condition fails to improve as anticipated.  The above assessment and management plan was discussed with the patient. The patient verbalized understanding of and has agreed to the management plan. Patient is aware to call the clinic if symptoms persist or worsen. Patient is aware when to return to the clinic for a follow-up visit. Patient educated on when it is appropriate to go to the emergency department.    I provided 33 minutes of non-face-to-face time during this encounter.    Worthy Rancher, MD

## 2018-12-24 ENCOUNTER — Encounter: Payer: Self-pay | Admitting: Family Medicine

## 2019-02-02 ENCOUNTER — Other Ambulatory Visit: Payer: Self-pay | Admitting: Family Medicine

## 2019-02-15 IMAGING — DX DG KNEE 1-2V*L*
2 series · 2 of 2 positions shown · non-contrast
Comparison: Left knee MRI- 06/30/2009

CLINICAL DATA: Left-sided knee pain

EXAM:
LEFT KNEE - 1-2 VIEW

[knee ap]
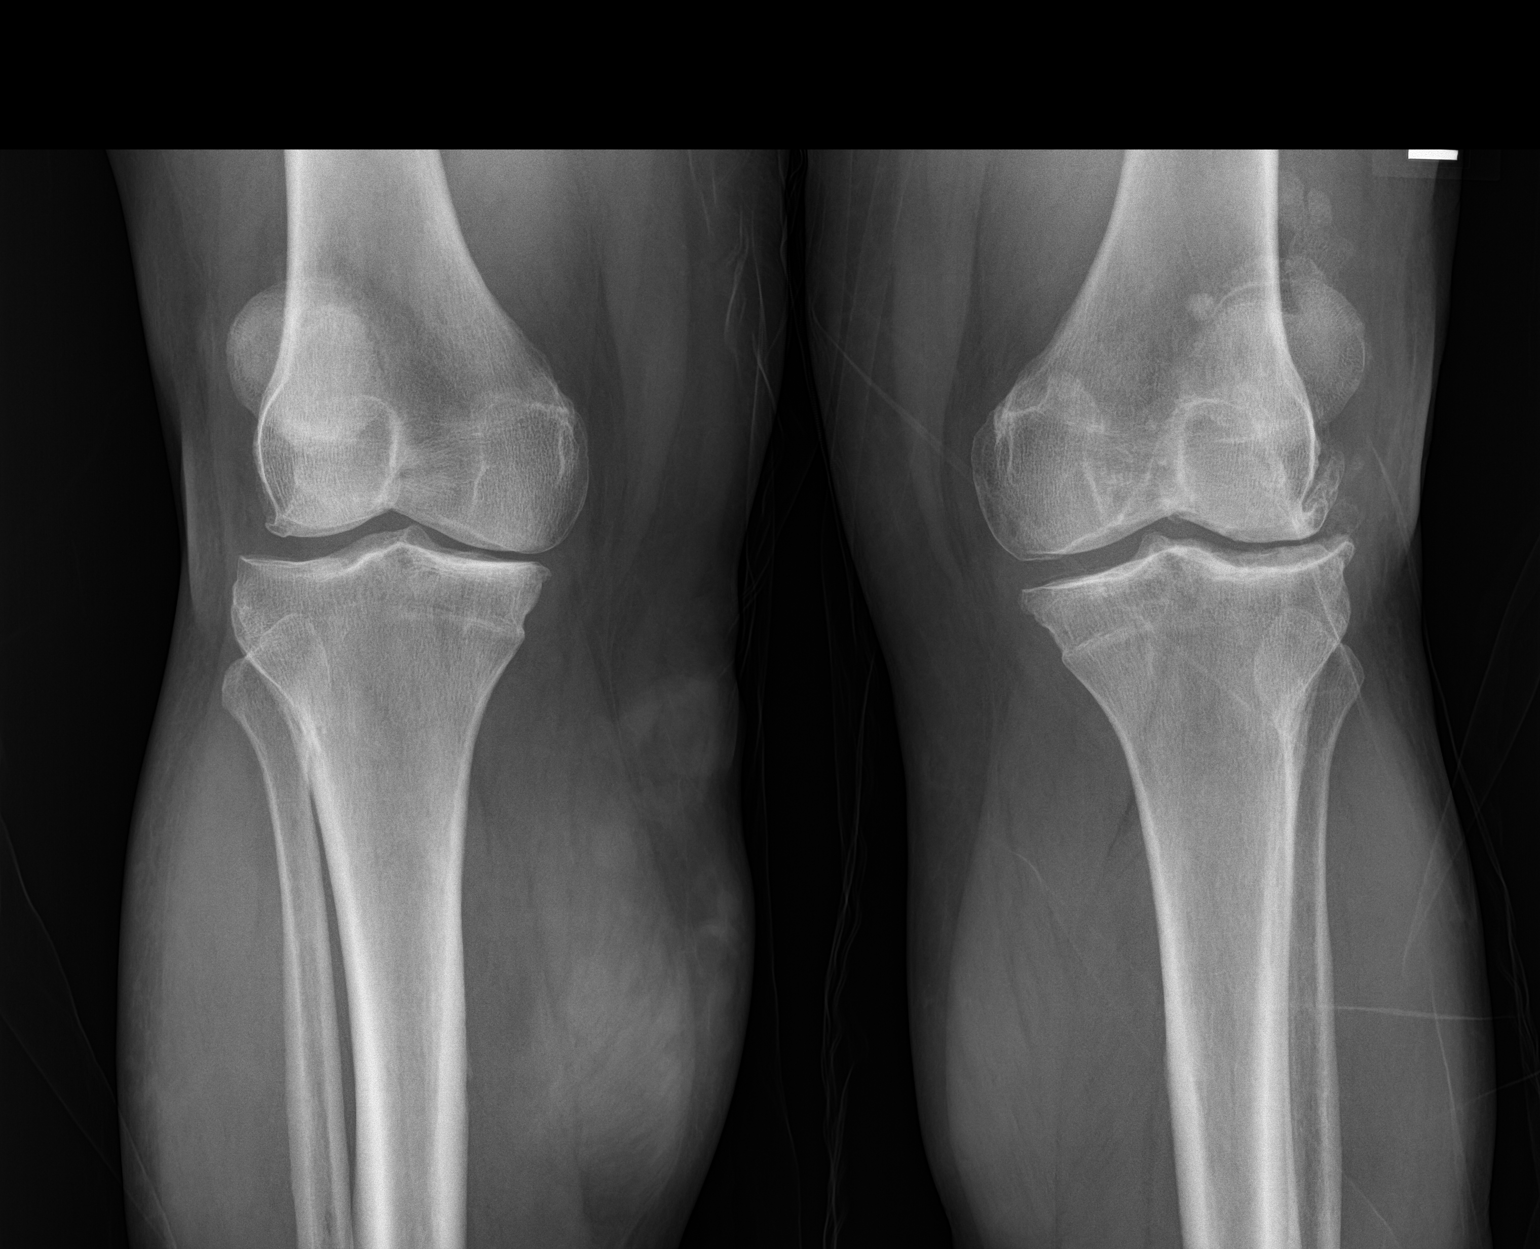

[knee lat]
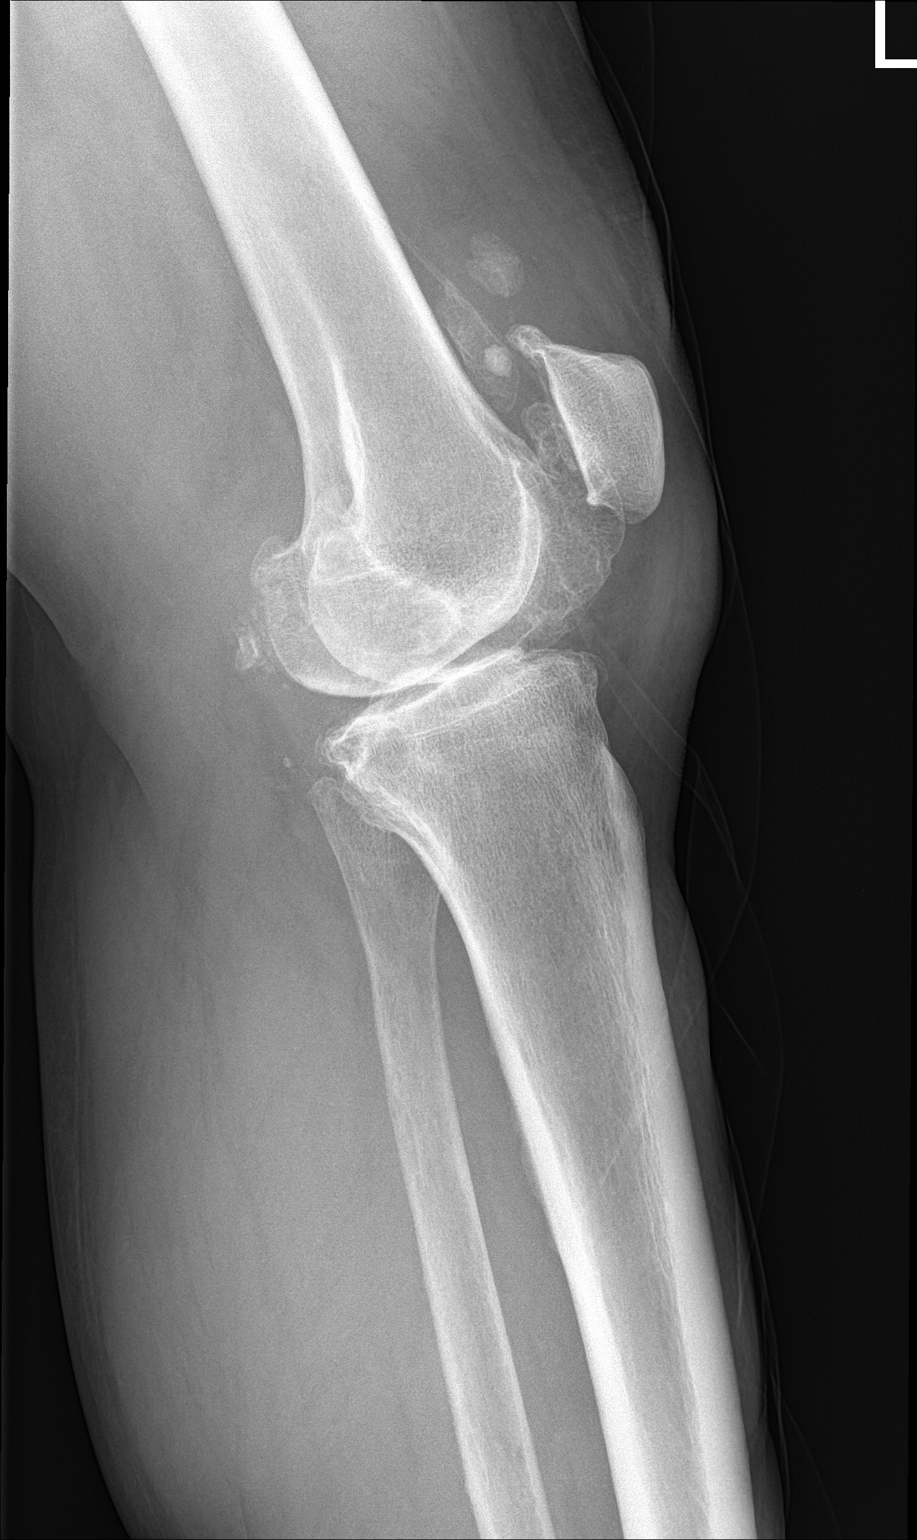

[2 of 2 positions shown; findings below may reference images not displayed]

FINDINGS: No fracture or dislocation.

Multiple loose bodies are seen within the suprapatellar knee joint
space, similar prior knee MRI. Suspected small knee joint effusion.
Moderate to severe tricompartmental degenerative change of the left
knee, worse with the patellar femoral joint and lateral compartment
with joint space loss, subchondral sclerosis, articular surface
irregularity and osteophytosis. There is minimal spurring the tibial
spines.

Moderate degenerative changes suspected within the contralateral
right knee, worse with the medial compartment, incompletely
evaluated.

Tubular structures within the subcutaneous tissues of medial aspect
of the right calf may represent varicosities. Regional soft tissues
appear otherwise normal.
IMPRESSION: 1. No acute findings.
2. Moderate to severe tricompartmental degenerative change of the
left knee.
3. Multiple loose body is seen within the suprapatellar knee joint
space, similar to prior knee MRI.

## 2019-02-19 ENCOUNTER — Telehealth: Payer: Self-pay | Admitting: *Deleted

## 2019-02-19 NOTE — Telephone Encounter (Addendum)
Prior Auth for Depo-Testosterone 200MG /ML solution-APPROVED   PA Case ID: [id number]    Your information has been submitted to [facility]. To check for an updated outcome later, reopen this PA request from your dashboard.  If [facility] has not responded to your request within 24 hours, contact [facility] at [phone]. If you think there may be a problem with your PA request, use our live chat feature at the bottom right.

## 2019-03-01 ENCOUNTER — Other Ambulatory Visit: Payer: Self-pay | Admitting: Family Medicine

## 2019-03-02 ENCOUNTER — Other Ambulatory Visit: Payer: Self-pay | Admitting: Family Medicine

## 2019-03-04 ENCOUNTER — Encounter: Payer: Self-pay | Admitting: Family Medicine

## 2019-03-04 ENCOUNTER — Other Ambulatory Visit: Payer: Self-pay

## 2019-03-04 ENCOUNTER — Ambulatory Visit (INDEPENDENT_AMBULATORY_CARE_PROVIDER_SITE_OTHER): Payer: BC Managed Care – PPO | Admitting: Family Medicine

## 2019-03-04 DIAGNOSIS — J0100 Acute maxillary sinusitis, unspecified: Secondary | ICD-10-CM | POA: Diagnosis not present

## 2019-03-04 MED ORDER — AMOXICILLIN-POT CLAVULANATE 875-125 MG PO TABS: 1.0000 | ORAL_TABLET | Freq: Two times a day (BID) | ORAL | 0 refills | Status: AC

## 2019-03-04 MED ORDER — PREDNISONE 20 MG PO TABS: ORAL_TABLET | ORAL | 0 refills | Status: DC

## 2019-03-04 MED ORDER — GUAIFENESIN ER 600 MG PO TB12: 600.0000 mg | ORAL_TABLET | Freq: Two times a day (BID) | ORAL | 0 refills | Status: AC

## 2019-03-04 NOTE — Progress Notes (Signed)
Virtual Visit via telephone Note Due to COVID-19 pandemic this visit was conducted virtually. This visit type was conducted due to national recommendations for restrictions regarding the COVID-19 Pandemic (e.g. social distancing, sheltering in place) in an effort to limit this patient's exposure and mitigate transmission in our community. All issues noted in this document were discussed and addressed.  A physical exam was not performed with this format.   I connected with Steve Williams on 03/04/19 at 1005 by telephone and verified that I am speaking with the correct person using two identifiers. Steve Williams is currently located at home and no one is currently with them during visit. The provider, Monia Pouch, FNP is located in their office at time of visit.  I discussed the limitations, risks, security and privacy concerns of performing an evaluation and management service by telephone and the availability of in person appointments. I also discussed with the patient that there may be a patient responsible charge related to this service. The patient expressed understanding and agreed to proceed.  Subjective:  Patient ID: Steve Williams, male    DOB: 1956/07/26, 63 y.o.   MRN: [id number]  Chief Complaint:  Sinus Problem   HPI: Steve Williams is a 63 y.o. male presenting on 03/04/2019 for Sinus Problem   Pt reports 7 days of maxillary sinus pressure that radiates into his upper jaw and teeth. Pt states has has pressure under his eyes and into his forehead. He has sputum production, greenish-yellow in color and thick, scratchy throat, nasal congestion, and post nasal drainage. Pt states he has been taking tylenol without relief of symptoms. He does take Singulair daily and use Flonase daily.   Sinus Problem This is a new problem. The current episode started 1 to 4 weeks ago. The problem has been gradually worsening since onset. There has been no fever. His pain is at a severity of 5/10. The pain is  moderate. Associated symptoms include chills, congestion, coughing, headaches, a hoarse voice, sinus pressure, sneezing, a sore throat and swollen glands. Pertinent negatives include no diaphoresis, ear pain, neck pain or shortness of breath. Past treatments include acetaminophen. The treatment provided no relief.     Relevant past medical, surgical, family, and social history reviewed and updated as indicated.  Allergies and medications reviewed and updated.   Past Medical History:  Diagnosis Date  . Allergy   . Anxiety   . Arthritis   . Asthma   . Cancer ([place])   . Depression   . Diabetes mellitus without complication ([place])   . Hyperlipidemia   . Hypertension     Past Surgical History:  Procedure Laterality Date  . CARPAL BONE EXCISION Bilateral   . KNEE ARTHROSCOPY Left   . KNEE SURGERY Left 1974  . lipoma removed    . TRIGGER FINGER RELEASE Right     Social History   Socioeconomic History  . Marital status: Married    Spouse name: Not on file  . Number of children: Not on file  . Years of education: Not on file  . Highest education level: Not on file  Occupational History  . Not on file  Social Needs  . Financial resource strain: Not on file  . Food insecurity    Worry: Not on file    Inability: Not on file  . Transportation needs    Medical: Not on file    Non-medical: Not on file  Tobacco Use  . Smoking status: Never Smoker  .  Smokeless tobacco: Never Used  Substance and Sexual Activity  . Alcohol use: No  . Drug use: No  . Sexual activity: Not on file  Lifestyle  . Physical activity    Days per week: Not on file    Minutes per session: Not on file  . Stress: Not on file  Relationships  . Social Herbalist on phone: Not on file    Gets together: Not on file    Attends religious service: Not on file    Active member of club or organization: Not on file    Attends meetings of clubs or organizations: Not on file    Relationship status:  Not on file  . Intimate partner violence    Fear of current or ex partner: Not on file    Emotionally abused: Not on file    Physically abused: Not on file    Forced sexual activity: Not on file  Other Topics Concern  . Not on file  Social History Narrative  . Not on file    Outpatient Encounter Medications as of 03/04/2019  Medication Sig  . amoxicillin-clavulanate (AUGMENTIN) 875-125 MG tablet Take 1 tablet by mouth 2 (two) times daily for 7 days.  Marland Kitchen aspirin 81 MG tablet Take 81 mg by mouth daily.  . B-D 3CC LUER-LOK SYR 22GX1-1/2 22G X 1-1/2" 3 ML MISC USE TO GIVE TESTOSTERONE  . BD DISP NEEDLES 18G X 1-1/2" MISC USE TO DRAW UP TESTOSTERONE  . fluticasone (FLONASE) 50 MCG/ACT nasal spray Place into both nostrils daily.  Marland Kitchen guaiFENesin (MUCINEX) 600 MG 12 hr tablet Take 1 tablet (600 mg total) by mouth 2 (two) times daily for 14 days.  Marland Kitchen losartan-hydrochlorothiazide (HYZAAR) 100-25 MG tablet TAKE 1 TABLET DAILY  . meloxicam (MOBIC) 15 MG tablet Take 1 tablet (15 mg total) by mouth daily.  . montelukast (SINGULAIR) 10 MG tablet TAKE 1 TABLET AT BEDTIME  . predniSONE (DELTASONE) 20 MG tablet 2 po at sametime daily for 5 days  . sertraline (ZOLOFT) 100 MG tablet Take 1 tablet (100 mg total) by mouth daily.  . sildenafil (REVATIO) 20 MG tablet TAKE 1-5 PILLS BY MOUTH ONCE DAILY AS NEEDED FOR ERECTILE DYSFUNCTION  . simvastatin (ZOCOR) 40 MG tablet Take 1 tablet (40 mg total) by mouth daily.  Marland Kitchen testosterone cypionate (DEPOTESTOSTERONE CYPIONATE) 200 MG/ML injection Inject 0.5 mLs (100 mg total) into the muscle every 7 (seven) days.  . [DISCONTINUED] doxycycline (VIBRA-TABS) 100 MG tablet Take 1 tablet (100 mg total) by mouth 2 (two) times daily.   Facility-Administered Encounter Medications as of 03/04/2019  Medication  . testosterone cypionate (DEPOTESTOSTERONE CYPIONATE) injection 400 mg    Allergies  Allergen Reactions  . Lisinopril   . Erythromycin Rash    Review of Systems   Constitutional: Positive for chills and fatigue. Negative for activity change, appetite change, diaphoresis, fever and unexpected weight change.  HENT: Positive for congestion, hoarse voice, postnasal drip, rhinorrhea, sinus pressure, sinus pain, sneezing, sore throat and voice change. Negative for dental problem, drooling, ear discharge, ear pain, facial swelling, hearing loss, mouth sores, nosebleeds, tinnitus and trouble swallowing.   Eyes: Negative for photophobia and visual disturbance.  Respiratory: Positive for cough. Negative for chest tightness, shortness of breath and wheezing.   Cardiovascular: Negative for chest pain, palpitations and leg swelling.  Gastrointestinal: Negative for abdominal pain, constipation, diarrhea, nausea and vomiting.  Genitourinary: Negative for decreased urine volume and difficulty urinating.  Musculoskeletal: Negative for arthralgias, myalgias  and neck pain.  Skin: Negative for pallor.  Neurological: Positive for headaches. Negative for dizziness, weakness and light-headedness.  Psychiatric/Behavioral: Negative for confusion.  All other systems reviewed and are negative.        Observations/Objective: No vital signs or physical exam, this was a telephone or virtual health encounter.  Pt alert and oriented, answers all questions appropriately, and able to speak in full sentences.    Assessment and Plan: Steve Williams was seen today for sinus problem.  Diagnoses and all orders for this visit:  Acute non-recurrent maxillary sinusitis Reported symptoms consistent with acute maxillary sinusitis. Pt has been trying conservative therapy for 7 days without relief of symptoms. Will initiate Augmentin, burst steroids, and Mucinex. Pt to continue Flonase, Tylenol, and Singulair. Pt can add frequent saline nasal sprays and allegra or zyrtec. Pt to increase water intake. Medications as prescribed. Report any new or worsening symptoms.  -     amoxicillin-clavulanate  (AUGMENTIN) 875-125 MG tablet; Take 1 tablet by mouth 2 (two) times daily for 7 days. -     guaiFENesin (MUCINEX) 600 MG 12 hr tablet; Take 1 tablet (600 mg total) by mouth 2 (two) times daily for 14 days. -     predniSONE (DELTASONE) 20 MG tablet; 2 po at sametime daily for 5 days     Follow Up Instructions: Return if symptoms worsen or fail to improve.    I discussed the assessment and treatment plan with the patient. The patient was provided an opportunity to ask questions and all were answered. The patient agreed with the plan and demonstrated an understanding of the instructions.   The patient was advised to call back or seek an in-person evaluation if the symptoms worsen or if the condition fails to improve as anticipated.  The above assessment and management plan was discussed with the patient. The patient verbalized understanding of and has agreed to the management plan. Patient is aware to call the clinic if symptoms persist or worsen. Patient is aware when to return to the clinic for a follow-up visit. Patient educated on when it is appropriate to go to the emergency department.    I provided 15 minutes of non-face-to-face time during this encounter. The call started at 1005. The call ended at 1020. The other time was used for coordination of care.    Monia Pouch, FNP-C [facility] [address] [place], [place] [address] [phone] 03/04/19

## 2019-03-12 ENCOUNTER — Ambulatory Visit: Payer: BC Managed Care – PPO | Admitting: Family Medicine

## 2019-03-12 ENCOUNTER — Other Ambulatory Visit: Payer: Self-pay | Admitting: Family Medicine

## 2019-03-18 ENCOUNTER — Other Ambulatory Visit: Payer: Self-pay | Admitting: Family Medicine

## 2019-03-18 DIAGNOSIS — E291 Testicular hypofunction: Secondary | ICD-10-CM

## 2019-04-13 ENCOUNTER — Other Ambulatory Visit: Payer: Self-pay | Admitting: Family Medicine

## 2019-04-29 ENCOUNTER — Ambulatory Visit: Payer: BC Managed Care – PPO | Admitting: Urology

## 2019-05-09 ENCOUNTER — Other Ambulatory Visit: Payer: Self-pay

## 2019-05-09 DIAGNOSIS — Z20822 Contact with and (suspected) exposure to covid-19: Secondary | ICD-10-CM

## 2019-05-10 LAB — NOVEL CORONAVIRUS, NAA: SARS-CoV-2, NAA: NOT DETECTED

## 2019-05-18 ENCOUNTER — Other Ambulatory Visit: Payer: Self-pay | Admitting: Family Medicine

## 2019-06-03 ENCOUNTER — Ambulatory Visit (INDEPENDENT_AMBULATORY_CARE_PROVIDER_SITE_OTHER): Payer: BC Managed Care – PPO | Admitting: Urology

## 2019-06-03 DIAGNOSIS — N5201 Erectile dysfunction due to arterial insufficiency: Secondary | ICD-10-CM | POA: Diagnosis not present

## 2019-06-03 DIAGNOSIS — E291 Testicular hypofunction: Secondary | ICD-10-CM

## 2019-06-03 DIAGNOSIS — R3915 Urgency of urination: Secondary | ICD-10-CM

## 2019-07-03 LAB — HM DIABETES EYE EXAM

## 2019-07-14 ENCOUNTER — Other Ambulatory Visit: Payer: Self-pay | Admitting: Family Medicine

## 2019-07-14 NOTE — Telephone Encounter (Signed)
Dettinger. NTBS should have been seen in October. Mail order RF not sched

## 2019-07-14 NOTE — Telephone Encounter (Signed)
Appointment made

## 2019-07-16 ENCOUNTER — Telehealth: Payer: Self-pay | Admitting: Family Medicine

## 2019-07-16 NOTE — Telephone Encounter (Signed)
What is the name of the medication? Testosterone Cypionate injection 400 mg Patient is out  Have you contacted your pharmacy to request a refill? YES  Which pharmacy would you like this sent to? [facility]   Patient notified that their request is being sent to the clinical staff for review and that they should receive a call once it is complete. If they do not receive a call within 24 hours they can check with their pharmacy or our office.

## 2019-07-17 NOTE — Telephone Encounter (Signed)
Lmtcb patient can make televisit for med refills

## 2019-07-17 NOTE — Telephone Encounter (Signed)
Unfortunately testosterone is controlled and due to the new regulations cannot be filled outside of a visit, even if we have to do a televisit in the meantime or if he can wait till January.  But unfortunately this cannot be refilled outside of a visit due to regulations and clinic policy,

## 2019-07-18 ENCOUNTER — Ambulatory Visit (INDEPENDENT_AMBULATORY_CARE_PROVIDER_SITE_OTHER): Payer: BC Managed Care – PPO | Admitting: Family Medicine

## 2019-07-18 ENCOUNTER — Encounter: Payer: Self-pay | Admitting: Family Medicine

## 2019-07-18 DIAGNOSIS — E291 Testicular hypofunction: Secondary | ICD-10-CM | POA: Diagnosis not present

## 2019-07-18 MED ORDER — TESTOSTERONE CYPIONATE 200 MG/ML IM SOLN: 100.0000 mg | INTRAMUSCULAR | 3 refills | Status: DC

## 2019-07-18 MED ORDER — AMOXICILLIN-POT CLAVULANATE 875-125 MG PO TABS: 1.0000 | ORAL_TABLET | Freq: Two times a day (BID) | ORAL | 0 refills | Status: DC

## 2019-07-18 NOTE — Progress Notes (Signed)
Virtual Visit via telephone Note  I connected with Steve Williams on 07/18/19 at 1145 by telephone and verified that I am speaking with the correct person using two identifiers. Steve Williams is currently located at home and no other people are currently with her during visit. The provider, Fransisca Kaufmann Analee Montee, MD is located in their office at time of visit.  Call ended at 1155  I discussed the limitations, risks, security and privacy concerns of performing an evaluation and management service by telephone and the availability of in person appointments. I also discussed with the patient that there may be a patient responsible charge related to this service. The patient expressed understanding and agreed to proceed.   History and Present Illness: Patient is calling in for testosterone refill for hypogonadism.  He says he just had his testosterone checked with his urologist and it was in the 700 range midcycle.  He feels like it is working well for him and denies any side effects from it.  He is coming in in January next month to get blood work and more of a physical exam.  Patient denies any chest pain or palpitations or lightheadedness or dizziness.   No diagnosis found.  Outpatient Encounter Medications as of 07/18/2019  Medication Sig  . aspirin 81 MG tablet Take 81 mg by mouth daily.  . B-D 3CC LUER-LOK SYR 22GX1-1/2 22G X 1-1/2" 3 ML MISC USE TO GIVE TESTOSTERONE  . BD DISP NEEDLES 18G X 1-1/2" MISC USE TO DRAW UP TESTOSTERONE  . fluticasone (FLONASE) 50 MCG/ACT nasal spray Place into both nostrils daily.  Marland Kitchen losartan-hydrochlorothiazide (HYZAAR) 100-25 MG tablet Take 1 tablet by mouth daily. (Needs to be seen before next refill)  . meloxicam (MOBIC) 15 MG tablet Take 1 tablet (15 mg total) by mouth daily.  . montelukast (SINGULAIR) 10 MG tablet TAKE 1 TABLET AT BEDTIME  . predniSONE (DELTASONE) 20 MG tablet 2 po at sametime daily for 5 days  . sertraline (ZOLOFT) 100 MG tablet TAKE 1  TABLET DAILY  . sildenafil (REVATIO) 20 MG tablet TAKE 1-5 TABLETS BY MOUTH ONCE DAILY AS NEEDED FOR ERECTILE DYSFUNCTION  . simvastatin (ZOCOR) 40 MG tablet Take 1 tablet (40 mg total) by mouth daily. (Needs to be seen before next refill)  . testosterone cypionate (DEPOTESTOSTERONE CYPIONATE) 200 MG/ML injection INJECT 0.5ML INTO THE MUSCLE EVERY SEVEN DAYS   Facility-Administered Encounter Medications as of 07/18/2019  Medication  . testosterone cypionate (DEPOTESTOSTERONE CYPIONATE) injection 400 mg    Review of Systems  Constitutional: Negative for chills and fever.  Respiratory: Negative for shortness of breath and wheezing.   Cardiovascular: Negative for chest pain and leg swelling.  Musculoskeletal: Negative for back pain and gait problem.  Skin: Negative for rash.  All other systems reviewed and are negative.   Observations/Objective: Patient sounds comfortable and in no acute distress  Assessment and Plan: Problem List Items Addressed This Visit      Endocrine   Hypogonadism in male - Primary   Relevant Medications   testosterone cypionate (DEPOTESTOSTERONE CYPIONATE) 200 MG/ML injection      Continue testosterone. Patient is starting to get a little bit of a sinus infection and wonders if he can get an Augmentin Follow up plan: Return in about 4 weeks (around 08/15/2019), or if symptoms worsen or fail to improve, for Follow-up labs and chronic health issues..     I discussed the assessment and treatment plan with the patient. The patient was provided  an opportunity to ask questions and all were answered. The patient agreed with the plan and demonstrated an understanding of the instructions.   The patient was advised to call back or seek an in-person evaluation if the symptoms worsen or if the condition fails to improve as anticipated.  The above assessment and management plan was discussed with the patient. The patient verbalized understanding of and has agreed to  the management plan. Patient is aware to call the clinic if symptoms persist or worsen. Patient is aware when to return to the clinic for a follow-up visit. Patient educated on when it is appropriate to go to the emergency department.    I provided 10 minutes of non-face-to-face time during this encounter.    Worthy Rancher, MD

## 2019-07-28 ENCOUNTER — Telehealth: Payer: Self-pay | Admitting: Family Medicine

## 2019-07-28 MED ORDER — SERTRALINE HCL 100 MG PO TABS: 100.0000 mg | ORAL_TABLET | Freq: Every day | ORAL | 0 refills | Status: DC

## 2019-07-28 NOTE — Telephone Encounter (Signed)
What is the name of the medication? sertraline (ZOLOFT) 100 MG tablet    Have you contacted your pharmacy to request a refill? yes  Which pharmacy would you like this sent to? [facility] he has been out for a week, he had televisit with Dr. Warrick Parisian on 07/18/2019   Patient notified that their request is being sent to the clinical staff for review and that they should receive a call once it is complete. If they do not receive a call within 24 hours they can check with their pharmacy or our office.

## 2019-08-07 ENCOUNTER — Other Ambulatory Visit: Payer: Self-pay

## 2019-08-08 ENCOUNTER — Encounter: Payer: Self-pay | Admitting: Family Medicine

## 2019-08-08 ENCOUNTER — Ambulatory Visit (INDEPENDENT_AMBULATORY_CARE_PROVIDER_SITE_OTHER): Payer: BC Managed Care – PPO | Admitting: Family Medicine

## 2019-08-08 VITALS — BP 127/78 | HR 79 | Temp 98.9°F | Ht 66.0 in | Wt 255.0 lb

## 2019-08-08 DIAGNOSIS — I10 Essential (primary) hypertension: Secondary | ICD-10-CM | POA: Diagnosis not present

## 2019-08-08 DIAGNOSIS — E1159 Type 2 diabetes mellitus with other circulatory complications: Secondary | ICD-10-CM

## 2019-08-08 DIAGNOSIS — E785 Hyperlipidemia, unspecified: Secondary | ICD-10-CM

## 2019-08-08 DIAGNOSIS — E1169 Type 2 diabetes mellitus with other specified complication: Secondary | ICD-10-CM | POA: Diagnosis not present

## 2019-08-08 DIAGNOSIS — I152 Hypertension secondary to endocrine disorders: Secondary | ICD-10-CM

## 2019-08-08 LAB — BAYER DCA HB A1C WAIVED: HB A1C (BAYER DCA - WAIVED): 5.4 % (ref <7.0)

## 2019-08-08 MED ORDER — LOSARTAN POTASSIUM-HCTZ 100-25 MG PO TABS: 1.0000 | ORAL_TABLET | Freq: Every day | ORAL | 3 refills | Status: DC

## 2019-08-08 MED ORDER — SERTRALINE HCL 100 MG PO TABS: 100.0000 mg | ORAL_TABLET | Freq: Every day | ORAL | 3 refills | Status: DC

## 2019-08-08 MED ORDER — MELOXICAM 15 MG PO TABS: 15.0000 mg | ORAL_TABLET | Freq: Every day | ORAL | 3 refills | Status: DC

## 2019-08-08 MED ORDER — SIMVASTATIN 40 MG PO TABS: 40.0000 mg | ORAL_TABLET | Freq: Every day | ORAL | 3 refills | Status: DC

## 2019-08-08 NOTE — Progress Notes (Signed)
BP 127/78   Pulse 79   Temp 98.9 F (37.2 C) (Temporal)   Ht _0  (1.676 m)   Wt 255 lb (115.7 kg)   SpO2 95%   BMI 41.16 kg/m    Subjective:   Patient ID: Steve Williams, male    DOB: 1956-01-09, 64 y.o.   MRN: [id number]  HPI: Steve Williams is a 64 y.o. male presenting on 08/08/2019 for Diabetes (check up of chronic medical conditions)   HPI Type 2 diabetes mellitus Patient comes in today for recheck of his diabetes. Patient has been currently taking no medication and has come off of the Metformin and his A1c today is still 5.4, he has been doing diet control and doing well.. Patient is currently on an ACE inhibitor/ARB. Patient has seen an ophthalmologist this year. Patient denies any issues with their feet.   Hypertension Patient is currently on losartan hydrochlorothiazide, and their blood pressure today is 127/78. Patient denies any lightheadedness or dizziness. Patient denies headaches, blurred vision, chest pains, shortness of breath, or weakness. Denies any side effects from medication and is content with current medication.   Hyperlipidemia Patient is coming in for recheck of his hyperlipidemia. The patient is currently taking simvastatin. They deny any issues with myalgias or history of liver damage from it. They deny any focal numbness or weakness or chest pain.   Patient is coming in for recheck of anxiety and depression, is currently on sertraline and says things are going well and he has no concerns about it. Depression screen [facility] 2/9 08/08/2019 07/26/2018 07/22/2018 02/27/2018 11/27/2017  Decreased Interest 0 0 0 0 0  Down, Depressed, Hopeless 0 0 0 0 0  PHQ - 2 Score 0 0 0 0 0     Relevant past medical, surgical, family and social history reviewed and updated as indicated. Interim medical history since our last visit reviewed. Allergies and medications reviewed and updated.  Review of Systems  Constitutional: Negative for chills and fever.  Eyes: Negative for  visual disturbance.  Respiratory: Negative for shortness of breath and wheezing.   Cardiovascular: Negative for chest pain and leg swelling.  Musculoskeletal: Negative for back pain and gait problem.  Skin: Negative for rash.  Neurological: Negative for dizziness, weakness and light-headedness.  All other systems reviewed and are negative.   Per HPI unless specifically indicated above   Allergies as of 08/08/2019      Reactions   Lisinopril    Erythromycin Rash      Medication List       Accurate as of August 08, 2019 12:01 PM. If you have any questions, ask your nurse or doctor.        STOP taking these medications   amoxicillin-clavulanate 875-125 MG tablet Commonly known as: AUGMENTIN Stopped by: Fransisca Kaufmann Wendell Fiebig, MD     TAKE these medications   aspirin 81 MG tablet Take 81 mg by mouth daily.   B-D 3CC LUER-LOK SYR 22GX1-1/2 22G X 1-1/2" 3 ML Misc Generic drug: SYRINGE-NEEDLE (DISP) 3 ML USE TO GIVE TESTOSTERONE   BD Disp Needles 18G X 1-1/2" Misc Generic drug: NEEDLE (DISP) 18 G USE TO DRAW UP TESTOSTERONE   fluticasone 50 MCG/ACT nasal spray Commonly known as: FLONASE Place into both nostrils daily.   losartan-hydrochlorothiazide 100-25 MG tablet Commonly known as: HYZAAR Take 1 tablet by mouth daily. What changed: additional instructions Changed by: Fransisca Kaufmann Caitlynne Harbeck, MD   meloxicam 15 MG tablet Commonly known as: MOBIC Take  1 tablet (15 mg total) by mouth daily.   montelukast 10 MG tablet Commonly known as: SINGULAIR TAKE 1 TABLET AT BEDTIME   sertraline 100 MG tablet Commonly known as: ZOLOFT Take 1 tablet (100 mg total) by mouth daily.   sildenafil 20 MG tablet Commonly known as: REVATIO TAKE 1-5 TABLETS BY MOUTH ONCE DAILY AS NEEDED FOR ERECTILE DYSFUNCTION   simvastatin 40 MG tablet Commonly known as: ZOCOR Take 1 tablet (40 mg total) by mouth daily. What changed: additional instructions Changed by: Fransisca Kaufmann Gelisa Tieken, MD     testosterone cypionate 200 MG/ML injection Commonly known as: DEPOTESTOSTERONE CYPIONATE Inject 0.5 mLs (100 mg total) into the muscle every 7 (seven) days.        Objective:   BP 127/78   Pulse 79   Temp 98.9 F (37.2 C) (Temporal)   Ht _0  (1.676 m)   Wt 255 lb (115.7 kg)   SpO2 95%   BMI 41.16 kg/m   Wt Readings from Last 3 Encounters:  08/08/19 255 lb (115.7 kg)  07/26/18 256 lb 6.4 oz (116.3 kg)  07/22/18 257 lb (116.6 kg)    Physical Exam Vitals and nursing note reviewed.  Constitutional:      General: He is not in acute distress.    Appearance: He is well-developed. He is not diaphoretic.  Eyes:     General: No scleral icterus.    Conjunctiva/sclera: Conjunctivae normal.  Neck:     Thyroid: No thyromegaly.  Cardiovascular:     Rate and Rhythm: Normal rate and regular rhythm.     Heart sounds: Normal heart sounds. No murmur.  Pulmonary:     Effort: Pulmonary effort is normal. No respiratory distress.     Breath sounds: Normal breath sounds. No wheezing.  Musculoskeletal:        General: Normal range of motion.     Cervical back: Neck supple.  Lymphadenopathy:     Cervical: No cervical adenopathy.  Skin:    General: Skin is warm and dry.     Findings: No rash.  Neurological:     Mental Status: He is alert and oriented to person, place, and time.     Coordination: Coordination normal.  Psychiatric:        Behavior: Behavior normal.     Results for orders placed or performed in visit on 07/18/19  HM DIABETES EYE EXAM  Result Value Ref Range   HM Diabetic Eye Exam No Retinopathy No Retinopathy    Assessment & Plan:   Problem List Items Addressed This Visit      Cardiovascular and Mediastinum   Hypertension associated with diabetes ([place])   Relevant Medications   losartan-hydrochlorothiazide (HYZAAR) 100-25 MG tablet   simvastatin (ZOCOR) 40 MG tablet   Other Relevant Orders   CMP14+EGFR     Endocrine   DM2 (diabetes mellitus, type 2)  ([place]) - Primary   Relevant Medications   losartan-hydrochlorothiazide (HYZAAR) 100-25 MG tablet   simvastatin (ZOCOR) 40 MG tablet   Other Relevant Orders   hgba1c   CBC with Differential/Platelet   CMP14+EGFR   Hyperlipidemia associated with type 2 diabetes mellitus ([facility])   Relevant Medications   losartan-hydrochlorothiazide (HYZAAR) 100-25 MG tablet   simvastatin (ZOCOR) 40 MG tablet   Other Relevant Orders   Lipid panel      Continue current medication, A1c looks good we will add on blood work. Follow up plan: Return in about 3 months (around 11/06/2019), or if symptoms worsen or  fail to improve, for Diabetes and hypertension recheck.  Counseling provided for all of the vaccine components Orders Placed This Encounter  Procedures  . hgba1c  . CBC with Differential/Platelet  . CMP14+EGFR  . Lipid panel    Caryl Pina, MD [facility] 08/08/2019, 12:01 PM

## 2019-08-09 LAB — CBC WITH DIFFERENTIAL/PLATELET
Basophils Absolute: 0.1 10*3/uL (ref 0.0–0.2)
Basos: 1 %
EOS (ABSOLUTE): 0.1 10*3/uL (ref 0.0–0.4)
Eos: 1 %
Hematocrit: 50.2 % (ref 37.5–51.0)
Hemoglobin: 18.3 g/dL — ABNORMAL HIGH (ref 13.0–17.7)
Immature Grans (Abs): 0.1 10*3/uL (ref 0.0–0.1)
Immature Granulocytes: 1 %
Lymphocytes Absolute: 2.3 10*3/uL (ref 0.7–3.1)
Lymphs: 30 %
MCH: 31.9 pg (ref 26.6–33.0)
MCHC: 36.5 g/dL — ABNORMAL HIGH (ref 31.5–35.7)
MCV: 88 fL (ref 79–97)
Monocytes Absolute: 0.7 10*3/uL (ref 0.1–0.9)
Monocytes: 9 %
Neutrophils Absolute: 4.4 10*3/uL (ref 1.4–7.0)
Neutrophils: 58 %
Platelets: 117 10*3/uL — ABNORMAL LOW (ref 150–450)
RBC: 5.73 x10E6/uL (ref 4.14–5.80)
RDW: 13 % (ref 11.6–15.4)
WBC: 7.5 10*3/uL (ref 3.4–10.8)

## 2019-08-09 LAB — LIPID PANEL
Chol/HDL Ratio: 4 ratio (ref 0.0–5.0)
Cholesterol, Total: 139 mg/dL (ref 100–199)
HDL: 35 mg/dL — ABNORMAL LOW (ref >39)
LDL Chol Calc (NIH): 50 mg/dL (ref 0–99)
Triglycerides: 355 mg/dL — ABNORMAL HIGH (ref 0–149)
VLDL Cholesterol Cal: 54 mg/dL — ABNORMAL HIGH (ref 5–40)

## 2019-08-09 LAB — CMP14+EGFR
ALT: 22 IU/L (ref 0–44)
AST: 17 IU/L (ref 0–40)
Albumin/Globulin Ratio: 2.2 (ref 1.2–2.2)
Albumin: 4.4 g/dL (ref 3.8–4.8)
Alkaline Phosphatase: 72 IU/L (ref 39–117)
BUN/Creatinine Ratio: 26 — ABNORMAL HIGH (ref 10–24)
BUN: 19 mg/dL (ref 8–27)
Bilirubin Total: 0.7 mg/dL (ref 0.0–1.2)
CO2: 24 mmol/L (ref 20–29)
Calcium: 9.4 mg/dL (ref 8.6–10.2)
Chloride: 101 mmol/L (ref 96–106)
Creatinine, Ser: 0.74 mg/dL — ABNORMAL LOW (ref 0.76–1.27)
GFR calc Af Amer: 113 mL/min/{1.73_m2} (ref >59)
GFR calc non Af Amer: 97 mL/min/{1.73_m2} (ref >59)
Globulin, Total: 2 g/dL (ref 1.5–4.5)
Glucose: 89 mg/dL (ref 65–99)
Potassium: 4 mmol/L (ref 3.5–5.2)
Sodium: 140 mmol/L (ref 134–144)
Total Protein: 6.4 g/dL (ref 6.0–8.5)

## 2019-08-10 ENCOUNTER — Other Ambulatory Visit: Payer: Self-pay | Admitting: Family Medicine

## 2019-08-11 ENCOUNTER — Encounter: Payer: Self-pay | Admitting: Family Medicine

## 2019-09-04 DIAGNOSIS — H905 Unspecified sensorineural hearing loss: Secondary | ICD-10-CM | POA: Diagnosis not present

## 2019-10-29 ENCOUNTER — Encounter: Payer: Self-pay | Admitting: Family Medicine

## 2019-10-29 ENCOUNTER — Telehealth (INDEPENDENT_AMBULATORY_CARE_PROVIDER_SITE_OTHER): Payer: Medicare Other | Admitting: Family Medicine

## 2019-10-29 DIAGNOSIS — J0100 Acute maxillary sinusitis, unspecified: Secondary | ICD-10-CM | POA: Diagnosis not present

## 2019-10-29 MED ORDER — AMOXICILLIN-POT CLAVULANATE 875-125 MG PO TABS: 1.0000 | ORAL_TABLET | Freq: Two times a day (BID) | ORAL | 0 refills | Status: DC

## 2019-10-29 MED ORDER — HYDROCODONE-HOMATROPINE 5-1.5 MG/5ML PO SYRP: 5.0000 mL | ORAL_SOLUTION | Freq: Three times a day (TID) | ORAL | 0 refills | Status: DC | PRN

## 2019-10-29 MED ORDER — PREDNISONE 20 MG PO TABS: ORAL_TABLET | ORAL | 0 refills | Status: DC

## 2019-10-29 NOTE — Progress Notes (Signed)
Virtual Visit via MyChart video note  I connected with Steve Williams on 10/29/19 at 1115 by video and verified that I am speaking with the correct person using two identifiers. Steve Williams is currently located at home and no other people are currently with her during visit. The provider, Fransisca Kaufmann Jacee Enerson, MD is located in their office at time of visit.  Call ended at 1127  I discussed the limitations, risks, security and privacy concerns of performing an evaluation and management service by video and the availability of in person appointments. I also discussed with the patient that there may be a patient responsible charge related to this service. The patient expressed understanding and agreed to proceed.   History and Present Illness: Patient is calling in for allergies, and pressure and congestion and sinus and coughing and hacking and feeling flushed and chills and sinus pressure.  He was outside yesterday and this has been going on for 5-6 days.  He took amoxicillin for 4 days and loratadine and singulair and mucinex and flonase and it is not getting better.  He does not have chills or sick contacts. He was around grandchildren last week and they had cold.    1. Acute non-recurrent maxillary sinusitis     Outpatient Encounter Medications as of 10/29/2019  Medication Sig  . amoxicillin-clavulanate (AUGMENTIN) 875-125 MG tablet Take 1 tablet by mouth 2 (two) times daily.  Marland Kitchen aspirin 81 MG tablet Take 81 mg by mouth daily.  . B-D 3CC LUER-LOK SYR 22GX1-1/2 22G X 1-1/2" 3 ML MISC USE TO GIVE TESTOSTERONE  . BD DISP NEEDLES 18G X 1-1/2" MISC USE TO DRAW UP TESTOSTERONE  . fluticasone (FLONASE) 50 MCG/ACT nasal spray Place into both nostrils daily.  Marland Kitchen HYDROcodone-homatropine (HYCODAN) 5-1.5 MG/5ML syrup Take 5 mLs by mouth every 8 (eight) hours as needed for cough.  . losartan-hydrochlorothiazide (HYZAAR) 100-25 MG tablet TAKE 1 TABLET DAILY  . meloxicam (MOBIC) 15 MG tablet Take 1 tablet  (15 mg total) by mouth daily.  . montelukast (SINGULAIR) 10 MG tablet TAKE 1 TABLET AT BEDTIME  . predniSONE (DELTASONE) 20 MG tablet 2 po at same time daily for 5 days  . sertraline (ZOLOFT) 100 MG tablet Take 1 tablet (100 mg total) by mouth daily.  . sildenafil (REVATIO) 20 MG tablet TAKE 1-5 TABLETS BY MOUTH ONCE DAILY AS NEEDED FOR ERECTILE DYSFUNCTION  . simvastatin (ZOCOR) 40 MG tablet TAKE 1 TABLET DAILY  . testosterone cypionate (DEPOTESTOSTERONE CYPIONATE) 200 MG/ML injection Inject 0.5 mLs (100 mg total) into the muscle every 7 (seven) days.   No facility-administered encounter medications on file as of 10/29/2019.    Review of Systems  Constitutional: Negative for chills and fever.  HENT: Positive for congestion, postnasal drip, rhinorrhea, sinus pressure, sneezing and sore throat. Negative for ear discharge, ear pain and voice change.   Eyes: Negative for pain, discharge, redness and visual disturbance.  Respiratory: Positive for cough. Negative for shortness of breath and wheezing.   Cardiovascular: Negative for chest pain and leg swelling.  Musculoskeletal: Negative for myalgias.  Skin: Negative for rash.  All other systems reviewed and are negative.   Observations/Objective: Patient sounds comfortable and in no acute distress  Assessment and Plan: Problem List Items Addressed This Visit    None    Visit Diagnoses    Acute non-recurrent maxillary sinusitis    -  Primary   Relevant Medications   amoxicillin-clavulanate (AUGMENTIN) 875-125 MG tablet   HYDROcodone-homatropine (HYCODAN)  5-1.5 MG/5ML syrup   predniSONE (DELTASONE) 20 MG tablet      Continue Augmentin and Hycodan and prednisone Follow up plan: Return if symptoms worsen or fail to improve.     I discussed the assessment and treatment plan with the patient. The patient was provided an opportunity to ask questions and all were answered. The patient agreed with the plan and demonstrated an understanding  of the instructions.   The patient was advised to call back or seek an in-person evaluation if the symptoms worsen or if the condition fails to improve as anticipated.  The above assessment and management plan was discussed with the patient. The patient verbalized understanding of and has agreed to the management plan. Patient is aware to call the clinic if symptoms persist or worsen. Patient is aware when to return to the clinic for a follow-up visit. Patient educated on when it is appropriate to go to the emergency department.    I provided 12 minutes of non-face-to-face time during this encounter.    Worthy Rancher, MD

## 2019-11-06 ENCOUNTER — Other Ambulatory Visit: Payer: Self-pay

## 2019-11-06 ENCOUNTER — Ambulatory Visit (INDEPENDENT_AMBULATORY_CARE_PROVIDER_SITE_OTHER): Payer: Medicare Other | Admitting: Family Medicine

## 2019-11-06 ENCOUNTER — Encounter: Payer: Self-pay | Admitting: Family Medicine

## 2019-11-06 VITALS — BP 139/81 | HR 82 | Temp 99.1°F | Ht 66.0 in | Wt 260.0 lb

## 2019-11-06 DIAGNOSIS — R319 Hematuria, unspecified: Secondary | ICD-10-CM

## 2019-11-06 DIAGNOSIS — E1159 Type 2 diabetes mellitus with other circulatory complications: Secondary | ICD-10-CM | POA: Diagnosis not present

## 2019-11-06 DIAGNOSIS — E1169 Type 2 diabetes mellitus with other specified complication: Secondary | ICD-10-CM | POA: Diagnosis not present

## 2019-11-06 DIAGNOSIS — E785 Hyperlipidemia, unspecified: Secondary | ICD-10-CM

## 2019-11-06 DIAGNOSIS — I10 Essential (primary) hypertension: Secondary | ICD-10-CM | POA: Diagnosis not present

## 2019-11-06 LAB — URINALYSIS, COMPLETE
Bilirubin, UA: NEGATIVE
Ketones, UA: NEGATIVE
Leukocytes,UA: NEGATIVE
Nitrite, UA: NEGATIVE
Protein,UA: NEGATIVE
RBC, UA: NEGATIVE
Specific Gravity, UA: 1.02 (ref 1.005–1.030)
Urobilinogen, Ur: 0.2 mg/dL (ref 0.2–1.0)
pH, UA: 7 (ref 5.0–7.5)

## 2019-11-06 LAB — MICROSCOPIC EXAMINATION
Bacteria, UA: NONE SEEN
Epithelial Cells (non renal): NONE SEEN /hpf (ref 0–10)
RBC, Urine: NONE SEEN /hpf (ref 0–2)
Renal Epithel, UA: NONE SEEN /hpf
WBC, UA: NONE SEEN /hpf (ref 0–5)

## 2019-11-06 LAB — BAYER DCA HB A1C WAIVED: HB A1C (BAYER DCA - WAIVED): 6 % (ref <7.0)

## 2019-11-06 NOTE — Progress Notes (Signed)
BP 139/81   Pulse 82   Temp 99.1 F (37.3 C)   Ht 5' 6"  (1.676 m)   Wt 260 lb (117.9 kg)   SpO2 96%   BMI 41.97 kg/m    Subjective:   Patient ID: Steve Williams, male    DOB: [birth year], 64 y.o.   MRN: [id number]  HPI: Steve Williams is a 64 y.o. male presenting on 11/06/2019 for Medical Management of Chronic Issues and Diabetes   HPI Type 2 diabetes mellitus Patient comes in today for recheck of his diabetes. Patient has been currently taking no medication currently and A1c is 6.0. Patient is currently on an ACE inhibitor/ARB. Patient has not seen an ophthalmologist this year. Patient denies any issues with their feet.   Hypertension Patient is currently on losartan hydrochlorothiazide, and their blood pressure today is 139/81. Patient denies any lightheadedness or dizziness. Patient denies headaches, blurred vision, chest pains, shortness of breath, or weakness. Denies any side effects from medication and is content with current medication.   Hyperlipidemia Patient is coming in for recheck of his hyperlipidemia. The patient is currently taking simvastatin. They deny any issues with myalgias or history of liver damage from it. They deny any focal numbness or weakness or chest pain.   Patient is having a little bit of rash around the foreskin on his penis that he says been more irritated since he had an accident and it stayed on his underwear and that he had irritation.  He has been using Dial soap and petroleum jelly on it which is helping but he still has a little bit of irritation around the foreskin near the head of his penis.  Relevant past medical, surgical, family and social history reviewed and updated as indicated. Interim medical history since our last visit reviewed. Allergies and medications reviewed and updated.  Review of Systems  Constitutional: Negative for chills and fever.  Eyes: Negative for visual disturbance.  Respiratory: Negative for shortness of breath and  wheezing.   Cardiovascular: Negative for chest pain and leg swelling.  Musculoskeletal: Negative for back pain and gait problem.  Skin: Positive for rash.  Neurological: Negative for dizziness, weakness and light-headedness.  All other systems reviewed and are negative.   Per HPI unless specifically indicated above   Allergies as of 11/06/2019      Reactions   Lisinopril    Erythromycin Rash      Medication List       Accurate as of November 06, 2019 11:20 AM. If you have any questions, ask your nurse or doctor.        STOP taking these medications   amoxicillin-clavulanate 875-125 MG tablet Commonly known as: AUGMENTIN Stopped by: Fransisca Kaufmann Tyshika Baldridge, MD     TAKE these medications   aspirin 81 MG tablet Take 81 mg by mouth daily.   B-D 3CC LUER-LOK SYR 22GX1-1/2 22G X 1-1/2" 3 ML Misc Generic drug: SYRINGE-NEEDLE (DISP) 3 ML USE TO GIVE TESTOSTERONE   BD Disp Needles 18G X 1-1/2" Misc Generic drug: NEEDLE (DISP) 18 G USE TO DRAW UP TESTOSTERONE   fluticasone 50 MCG/ACT nasal spray Commonly known as: FLONASE Place into both nostrils daily.   HYDROcodone-homatropine 5-1.5 MG/5ML syrup Commonly known as: HYCODAN Take 5 mLs by mouth every 8 (eight) hours as needed for cough.   losartan-hydrochlorothiazide 100-25 MG tablet Commonly known as: HYZAAR TAKE 1 TABLET DAILY   meloxicam 15 MG tablet Commonly known as: MOBIC Take 1 tablet (15 mg  total) by mouth daily.   montelukast 10 MG tablet Commonly known as: SINGULAIR TAKE 1 TABLET AT BEDTIME   predniSONE 20 MG tablet Commonly known as: DELTASONE 2 po at same time daily for 5 days   sertraline 100 MG tablet Commonly known as: ZOLOFT Take 1 tablet (100 mg total) by mouth daily.   sildenafil 20 MG tablet Commonly known as: REVATIO TAKE 1-5 TABLETS BY MOUTH ONCE DAILY AS NEEDED FOR ERECTILE DYSFUNCTION   simvastatin 40 MG tablet Commonly known as: ZOCOR TAKE 1 TABLET DAILY   testosterone cypionate 200  MG/ML injection Commonly known as: DEPOTESTOSTERONE CYPIONATE Inject 0.5 mLs (100 mg total) into the muscle every 7 (seven) days.        Objective:   BP 139/81   Pulse 82   Temp 99.1 F (37.3 C)   Ht 5' 6"  (1.676 m)   Wt 260 lb (117.9 kg)   SpO2 96%   BMI 41.97 kg/m   Wt Readings from Last 3 Encounters:  11/06/19 260 lb (117.9 kg)  08/08/19 255 lb (115.7 kg)  07/26/18 256 lb 6.4 oz (116.3 kg)    Physical Exam Vitals and nursing note reviewed.  Constitutional:      General: He is not in acute distress.    Appearance: He is well-developed. He is not diaphoretic.  Eyes:     General: No scleral icterus.    Conjunctiva/sclera: Conjunctivae normal.  Neck:     Thyroid: No thyromegaly.  Cardiovascular:     Rate and Rhythm: Normal rate and regular rhythm.     Heart sounds: Normal heart sounds. No murmur.  Pulmonary:     Effort: Pulmonary effort is normal. No respiratory distress.     Breath sounds: Normal breath sounds. No wheezing.  Musculoskeletal:        General: Normal range of motion.     Cervical back: Neck supple.  Lymphadenopathy:     Cervical: No cervical adenopathy.  Skin:    General: Skin is warm and dry.     Findings: No rash.  Neurological:     Mental Status: He is alert and oriented to person, place, and time.     Coordination: Coordination normal.  Psychiatric:        Behavior: Behavior normal.       Assessment & Plan:   Problem List Items Addressed This Visit      Cardiovascular and Mediastinum   Hypertension associated with diabetes ([place])   Relevant Orders   BMP8+EGFR   CBC with Differential/Platelet     Endocrine   DM2 (diabetes mellitus, type 2) ([place]) - Primary   Relevant Orders   Bayer DCA Hb A1c Waived   BMP8+EGFR   CBC with Differential/Platelet   Hyperlipidemia associated with type 2 diabetes mellitus ([place])     Other   Morbid obesity ([place])    Other Visit Diagnoses    Hematuria, unspecified type       Relevant Orders    Urinalysis, Complete   Urine Culture   BMP8+EGFR      Recommended using a antifungal cream over-the-counter to help with the rash on his penis.  Urinalysis is clear, no signs of infection, no hematuria on the analysis. Follow up plan: Return in about 3 months (around 02/05/2020), or if symptoms worsen or fail to improve, for Hypertension diabetes.  Counseling provided for all of the vaccine components Orders Placed This Encounter  Procedures  . Urine Culture  . Bayer DCA Hb A1c Waived  .  Urinalysis, Complete    Caryl Pina, MD [facility] 11/06/2019, 11:20 AM

## 2019-11-07 LAB — CBC WITH DIFFERENTIAL/PLATELET
Basophils Absolute: 0.1 10*3/uL (ref 0.0–0.2)
Basos: 1 %
EOS (ABSOLUTE): 0.1 10*3/uL (ref 0.0–0.4)
Eos: 1 %
Hematocrit: 52.7 % — ABNORMAL HIGH (ref 37.5–51.0)
Hemoglobin: 18.9 g/dL — ABNORMAL HIGH (ref 13.0–17.7)
Immature Grans (Abs): 0.1 10*3/uL (ref 0.0–0.1)
Immature Granulocytes: 1 %
Lymphocytes Absolute: 2.3 10*3/uL (ref 0.7–3.1)
Lymphs: 27 %
MCH: 31.9 pg (ref 26.6–33.0)
MCHC: 35.9 g/dL — ABNORMAL HIGH (ref 31.5–35.7)
MCV: 89 fL (ref 79–97)
Monocytes Absolute: 0.6 10*3/uL (ref 0.1–0.9)
Monocytes: 7 %
Neutrophils Absolute: 5.4 10*3/uL (ref 1.4–7.0)
Neutrophils: 63 %
Platelets: 130 10*3/uL — ABNORMAL LOW (ref 150–450)
RBC: 5.93 x10E6/uL — ABNORMAL HIGH (ref 4.14–5.80)
RDW: 13.4 % (ref 11.6–15.4)
WBC: 8.5 10*3/uL (ref 3.4–10.8)

## 2019-11-07 LAB — BMP8+EGFR
BUN/Creatinine Ratio: 17 (ref 10–24)
BUN: 16 mg/dL (ref 8–27)
CO2: 22 mmol/L (ref 20–29)
Calcium: 9.1 mg/dL (ref 8.6–10.2)
Chloride: 101 mmol/L (ref 96–106)
Creatinine, Ser: 0.96 mg/dL (ref 0.76–1.27)
GFR calc Af Amer: 95 mL/min/{1.73_m2} (ref >59)
GFR calc non Af Amer: 83 mL/min/{1.73_m2} (ref >59)
Glucose: 129 mg/dL — ABNORMAL HIGH (ref 65–99)
Potassium: 3.9 mmol/L (ref 3.5–5.2)
Sodium: 140 mmol/L (ref 134–144)

## 2019-11-10 LAB — URINE CULTURE

## 2019-11-18 ENCOUNTER — Other Ambulatory Visit: Payer: Self-pay | Admitting: Family Medicine

## 2019-11-18 MED ORDER — NITROFURANTOIN MONOHYD MACRO 100 MG PO CAPS: 100.0000 mg | ORAL_CAPSULE | Freq: Two times a day (BID) | ORAL | 0 refills | Status: DC

## 2019-11-24 ENCOUNTER — Other Ambulatory Visit: Payer: Self-pay

## 2019-11-24 ENCOUNTER — Telehealth: Payer: Self-pay | Admitting: Family Medicine

## 2019-11-24 MED ORDER — MONTELUKAST SODIUM 10 MG PO TABS: 10.0000 mg | ORAL_TABLET | Freq: Every day | ORAL | 3 refills | Status: DC

## 2019-11-24 MED ORDER — SIMVASTATIN 40 MG PO TABS: 40.0000 mg | ORAL_TABLET | Freq: Every day | ORAL | 3 refills | Status: DC

## 2019-11-24 MED ORDER — LOSARTAN POTASSIUM-HCTZ 100-25 MG PO TABS: 1.0000 | ORAL_TABLET | Freq: Every day | ORAL | 3 refills | Status: DC

## 2019-11-24 NOTE — Telephone Encounter (Signed)
  Prescription Request  11/24/2019  What is the name of the medication or equipment?  simvastatin (ZOCOR) 40 MG tablet montelukast (SINGULAIR) 10 MG tablet losartan-hydrochlorothiazide (HYZAAR) 100-25 MG tablet  Have you contacted your pharmacy to request a refill? (if applicable) no pt was here 11/06/2019  Which pharmacy would you like this sent to? [facility]   Patient notified that their request is being sent to the clinical staff for review and that they should receive a response within 2 business days.

## 2019-11-24 NOTE — Telephone Encounter (Signed)
Medications refilled. Pt seen in the office and had labs in April.

## 2020-01-12 DIAGNOSIS — M9903 Segmental and somatic dysfunction of lumbar region: Secondary | ICD-10-CM | POA: Diagnosis not present

## 2020-01-12 DIAGNOSIS — M9901 Segmental and somatic dysfunction of cervical region: Secondary | ICD-10-CM | POA: Diagnosis not present

## 2020-01-12 DIAGNOSIS — S134XXA Sprain of ligaments of cervical spine, initial encounter: Secondary | ICD-10-CM | POA: Diagnosis not present

## 2020-01-12 DIAGNOSIS — M9902 Segmental and somatic dysfunction of thoracic region: Secondary | ICD-10-CM | POA: Diagnosis not present

## 2020-01-12 DIAGNOSIS — S233XXA Sprain of ligaments of thoracic spine, initial encounter: Secondary | ICD-10-CM | POA: Diagnosis not present

## 2020-01-15 DIAGNOSIS — M9903 Segmental and somatic dysfunction of lumbar region: Secondary | ICD-10-CM | POA: Diagnosis not present

## 2020-01-15 DIAGNOSIS — S134XXA Sprain of ligaments of cervical spine, initial encounter: Secondary | ICD-10-CM | POA: Diagnosis not present

## 2020-01-15 DIAGNOSIS — S233XXA Sprain of ligaments of thoracic spine, initial encounter: Secondary | ICD-10-CM | POA: Diagnosis not present

## 2020-01-15 DIAGNOSIS — M9901 Segmental and somatic dysfunction of cervical region: Secondary | ICD-10-CM | POA: Diagnosis not present

## 2020-01-15 DIAGNOSIS — M9902 Segmental and somatic dysfunction of thoracic region: Secondary | ICD-10-CM | POA: Diagnosis not present

## 2020-01-19 DIAGNOSIS — M9902 Segmental and somatic dysfunction of thoracic region: Secondary | ICD-10-CM | POA: Diagnosis not present

## 2020-01-19 DIAGNOSIS — S134XXA Sprain of ligaments of cervical spine, initial encounter: Secondary | ICD-10-CM | POA: Diagnosis not present

## 2020-01-19 DIAGNOSIS — M9901 Segmental and somatic dysfunction of cervical region: Secondary | ICD-10-CM | POA: Diagnosis not present

## 2020-01-19 DIAGNOSIS — S233XXA Sprain of ligaments of thoracic spine, initial encounter: Secondary | ICD-10-CM | POA: Diagnosis not present

## 2020-01-19 DIAGNOSIS — M9903 Segmental and somatic dysfunction of lumbar region: Secondary | ICD-10-CM | POA: Diagnosis not present

## 2020-01-22 DIAGNOSIS — M9901 Segmental and somatic dysfunction of cervical region: Secondary | ICD-10-CM | POA: Diagnosis not present

## 2020-01-22 DIAGNOSIS — S134XXA Sprain of ligaments of cervical spine, initial encounter: Secondary | ICD-10-CM | POA: Diagnosis not present

## 2020-01-22 DIAGNOSIS — M9902 Segmental and somatic dysfunction of thoracic region: Secondary | ICD-10-CM | POA: Diagnosis not present

## 2020-01-22 DIAGNOSIS — S233XXA Sprain of ligaments of thoracic spine, initial encounter: Secondary | ICD-10-CM | POA: Diagnosis not present

## 2020-01-22 DIAGNOSIS — M9903 Segmental and somatic dysfunction of lumbar region: Secondary | ICD-10-CM | POA: Diagnosis not present

## 2020-02-05 ENCOUNTER — Other Ambulatory Visit: Payer: Self-pay

## 2020-02-05 ENCOUNTER — Ambulatory Visit (INDEPENDENT_AMBULATORY_CARE_PROVIDER_SITE_OTHER): Payer: Medicare Other | Admitting: Family Medicine

## 2020-02-05 ENCOUNTER — Encounter: Payer: Self-pay | Admitting: Family Medicine

## 2020-02-05 VITALS — BP 142/80 | HR 66 | Ht 66.0 in | Wt 264.0 lb

## 2020-02-05 DIAGNOSIS — E1159 Type 2 diabetes mellitus with other circulatory complications: Secondary | ICD-10-CM

## 2020-02-05 DIAGNOSIS — E1169 Type 2 diabetes mellitus with other specified complication: Secondary | ICD-10-CM

## 2020-02-05 DIAGNOSIS — E785 Hyperlipidemia, unspecified: Secondary | ICD-10-CM | POA: Diagnosis not present

## 2020-02-05 DIAGNOSIS — I10 Essential (primary) hypertension: Secondary | ICD-10-CM

## 2020-02-05 LAB — CMP14+EGFR
ALT: 20 IU/L (ref 0–44)
AST: 16 IU/L (ref 0–40)
Albumin/Globulin Ratio: 2.4 — ABNORMAL HIGH (ref 1.2–2.2)
Albumin: 4.4 g/dL (ref 3.8–4.8)
Alkaline Phosphatase: 66 IU/L (ref 48–121)
BUN/Creatinine Ratio: 19 (ref 10–24)
BUN: 15 mg/dL (ref 8–27)
Bilirubin Total: 0.6 mg/dL (ref 0.0–1.2)
CO2: 23 mmol/L (ref 20–29)
Calcium: 8.9 mg/dL (ref 8.6–10.2)
Chloride: 101 mmol/L (ref 96–106)
Creatinine, Ser: 0.8 mg/dL (ref 0.76–1.27)
GFR calc Af Amer: 108 mL/min/{1.73_m2} (ref >59)
GFR calc non Af Amer: 94 mL/min/{1.73_m2} (ref >59)
Globulin, Total: 1.8 g/dL (ref 1.5–4.5)
Glucose: 132 mg/dL — ABNORMAL HIGH (ref 65–99)
Potassium: 3.9 mmol/L (ref 3.5–5.2)
Sodium: 140 mmol/L (ref 134–144)
Total Protein: 6.2 g/dL (ref 6.0–8.5)

## 2020-02-05 LAB — LIPID PANEL
Chol/HDL Ratio: 3.4 ratio (ref 0.0–5.0)
Cholesterol, Total: 124 mg/dL (ref 100–199)
HDL: 36 mg/dL — ABNORMAL LOW (ref >39)
LDL Chol Calc (NIH): 58 mg/dL (ref 0–99)
Triglycerides: 177 mg/dL — ABNORMAL HIGH (ref 0–149)
VLDL Cholesterol Cal: 30 mg/dL (ref 5–40)

## 2020-02-05 LAB — CBC WITH DIFFERENTIAL/PLATELET
Basophils Absolute: 0.1 10*3/uL (ref 0.0–0.2)
Basos: 1 %
EOS (ABSOLUTE): 0.1 10*3/uL (ref 0.0–0.4)
Eos: 2 %
Hematocrit: 50.1 % (ref 37.5–51.0)
Hemoglobin: 17.7 g/dL (ref 13.0–17.7)
Immature Grans (Abs): 0 10*3/uL (ref 0.0–0.1)
Immature Granulocytes: 1 %
Lymphocytes Absolute: 1.7 10*3/uL (ref 0.7–3.1)
Lymphs: 26 %
MCH: 30.7 pg (ref 26.6–33.0)
MCHC: 35.3 g/dL (ref 31.5–35.7)
MCV: 87 fL (ref 79–97)
Monocytes Absolute: 0.5 10*3/uL (ref 0.1–0.9)
Monocytes: 7 %
Neutrophils Absolute: 4.3 10*3/uL (ref 1.4–7.0)
Neutrophils: 63 %
Platelets: 104 10*3/uL — ABNORMAL LOW (ref 150–450)
RBC: 5.76 x10E6/uL (ref 4.14–5.80)
RDW: 13 % (ref 11.6–15.4)
WBC: 6.7 10*3/uL (ref 3.4–10.8)

## 2020-02-05 LAB — BAYER DCA HB A1C WAIVED: HB A1C (BAYER DCA - WAIVED): 5.5 % (ref <7.0)

## 2020-02-05 MED ORDER — LOSARTAN POTASSIUM-HCTZ 100-25 MG PO TABS: 1.0000 | ORAL_TABLET | Freq: Every day | ORAL | 3 refills | Status: DC

## 2020-02-05 MED ORDER — SIMVASTATIN 40 MG PO TABS: 40.0000 mg | ORAL_TABLET | Freq: Every day | ORAL | 3 refills | Status: DC

## 2020-02-05 NOTE — Progress Notes (Signed)
BP (!) 142/80   Pulse 66   Ht _0  (1.676 m)   Wt 264 lb (119.7 kg)   SpO2 94%   BMI 42.61 kg/m    Subjective:   Patient ID: Steve Williams, male    DOB: 1956-07-10, 64 y.o.   MRN: [id number]  HPI: Steve Williams is a 64 y.o. male presenting on 02/05/2020 for Medical Management of Chronic Issues, Diabetes, and Hypertension   HPI Type 2 diabetes mellitus Patient comes in today for recheck of his diabetes. Patient has been currently taking no medication for diabetes and his A1c today is 5.5. Patient is currently on an ACE inhibitor/ARB. Patient has not seen an ophthalmologist this year. Patient denies any issues with their feet. The symptom started onset as an adult hypertension and hyperlipidemia and obesity ARE RELATED TO DM   Hypertension Patient is currently on losartan hydrochlorothiazide, and their blood pressure today is 142/80. Patient denies any lightheadedness or dizziness. Patient denies headaches, blurred vision, chest pains, shortness of breath, or weakness. Denies any side effects from medication and is content with current medication.   Hyperlipidemia Patient is coming in for recheck of his hyperlipidemia. The patient is currently taking simvastatin. They deny any issues with myalgias or history of liver damage from it. They deny any focal numbness or weakness or chest pain.   Patient's hypertension and diabetes and cholesterol are more complicated by the patient's morbid obesity.  Discussed weight loss and lifestyle modification and exercise with the patient.   Relevant past medical, surgical, family and social history reviewed and updated as indicated. Interim medical history since our last visit reviewed. Allergies and medications reviewed and updated.  Review of Systems  Constitutional: Negative for chills and fever.  Eyes: Negative for visual disturbance.  Respiratory: Negative for shortness of breath and wheezing.   Cardiovascular: Negative for chest pain and leg  swelling.  Musculoskeletal: Negative for back pain and gait problem.  Skin: Negative for rash.  Neurological: Negative for dizziness, weakness and light-headedness.  All other systems reviewed and are negative.   Per HPI unless specifically indicated above   Allergies as of 02/05/2020      Reactions   Lisinopril    Erythromycin Rash      Medication List       Accurate as of February 05, 2020  9:02 AM. If you have any questions, ask your nurse or doctor.        STOP taking these medications   HYDROcodone-homatropine 5-1.5 MG/5ML syrup Commonly known as: HYCODAN Stopped by: Fransisca Kaufmann Jorryn Hershberger, MD   nitrofurantoin (macrocrystal-monohydrate) 100 MG capsule Commonly known as: Macrobid Stopped by: Worthy Rancher, MD   predniSONE 20 MG tablet Commonly known as: DELTASONE Stopped by: Fransisca Kaufmann Sharna Gabrys, MD     TAKE these medications   aspirin 81 MG tablet Take 81 mg by mouth daily.   B-D 3CC LUER-LOK SYR 22GX1-1/2 22G X 1-1/2" 3 ML Misc Generic drug: SYRINGE-NEEDLE (DISP) 3 ML USE TO GIVE TESTOSTERONE   BD Disp Needles 18G X 1-1/2" Misc Generic drug: NEEDLE (DISP) 18 G USE TO DRAW UP TESTOSTERONE   fluticasone 50 MCG/ACT nasal spray Commonly known as: FLONASE Place into both nostrils daily.   losartan-hydrochlorothiazide 100-25 MG tablet Commonly known as: HYZAAR Take 1 tablet by mouth daily.   meloxicam 15 MG tablet Commonly known as: MOBIC Take 1 tablet (15 mg total) by mouth daily.   montelukast 10 MG tablet Commonly known as: SINGULAIR Take  1 tablet (10 mg total) by mouth at bedtime.   sertraline 100 MG tablet Commonly known as: ZOLOFT Take 1 tablet (100 mg total) by mouth daily.   sildenafil 20 MG tablet Commonly known as: REVATIO TAKE 1-5 TABLETS BY MOUTH ONCE DAILY AS NEEDED FOR ERECTILE DYSFUNCTION   simvastatin 40 MG tablet Commonly known as: ZOCOR Take 1 tablet (40 mg total) by mouth daily.   testosterone cypionate 200 MG/ML  injection Commonly known as: DEPOTESTOSTERONE CYPIONATE Inject 0.5 mLs (100 mg total) into the muscle every 7 (seven) days.        Objective:   BP (!) 142/80   Pulse 66   Ht _0  (1.676 m)   Wt 264 lb (119.7 kg)   SpO2 94%   BMI 42.61 kg/m   Wt Readings from Last 3 Encounters:  02/05/20 264 lb (119.7 kg)  11/06/19 260 lb (117.9 kg)  08/08/19 255 lb (115.7 kg)    Physical Exam Vitals and nursing note reviewed.  Constitutional:      General: He is not in acute distress.    Appearance: He is well-developed. He is not diaphoretic.  Eyes:     General: No scleral icterus.    Conjunctiva/sclera: Conjunctivae normal.  Neck:     Thyroid: No thyromegaly.  Cardiovascular:     Rate and Rhythm: Normal rate and regular rhythm.     Heart sounds: Normal heart sounds. No murmur heard.   Pulmonary:     Effort: Pulmonary effort is normal. No respiratory distress.     Breath sounds: Normal breath sounds. No wheezing.  Musculoskeletal:        General: Normal range of motion.     Cervical back: Neck supple.  Lymphadenopathy:     Cervical: No cervical adenopathy.  Skin:    General: Skin is warm and dry.     Findings: No rash.  Neurological:     Mental Status: He is alert and oriented to person, place, and time.     Coordination: Coordination normal.  Psychiatric:        Behavior: Behavior normal.       Assessment & Plan:   Problem List Items Addressed This Visit      Cardiovascular and Mediastinum   Hypertension associated with diabetes ([place])   Relevant Medications   simvastatin (ZOCOR) 40 MG tablet   losartan-hydrochlorothiazide (HYZAAR) 100-25 MG tablet     Endocrine   DM2 (diabetes mellitus, type 2) ([facility]) - Primary   Relevant Medications   simvastatin (ZOCOR) 40 MG tablet   losartan-hydrochlorothiazide (HYZAAR) 100-25 MG tablet   Other Relevant Orders   Bayer DCA Hb A1c Waived   CBC with Differential/Platelet   CMP14+EGFR   Hyperlipidemia associated with type  2 diabetes mellitus ([facility])   Relevant Medications   simvastatin (ZOCOR) 40 MG tablet   losartan-hydrochlorothiazide (HYZAAR) 100-25 MG tablet   Other Relevant Orders   Lipid panel     Other   Morbid obesity ([place])      Continue current medication.  Follow up plan: Return in about 3 months (around 05/07/2020), or if symptoms worsen or fail to improve, for Diabetes and hypertension cholesterol.  Counseling provided for all of the vaccine components Orders Placed This Encounter  Procedures  . Bayer DCA Hb A1c Waived  . CBC with Differential/Platelet  . CMP14+EGFR  . Lipid panel    Caryl Pina, MD [facility] 02/05/2020, 9:02 AM

## 2020-03-02 DIAGNOSIS — G4733 Obstructive sleep apnea (adult) (pediatric): Secondary | ICD-10-CM | POA: Diagnosis not present

## 2020-03-08 ENCOUNTER — Other Ambulatory Visit: Payer: Self-pay | Admitting: Family Medicine

## 2020-03-08 DIAGNOSIS — E291 Testicular hypofunction: Secondary | ICD-10-CM

## 2020-04-04 ENCOUNTER — Other Ambulatory Visit: Payer: Self-pay | Admitting: Family Medicine

## 2020-04-28 DIAGNOSIS — S134XXA Sprain of ligaments of cervical spine, initial encounter: Secondary | ICD-10-CM | POA: Diagnosis not present

## 2020-04-28 DIAGNOSIS — S233XXA Sprain of ligaments of thoracic spine, initial encounter: Secondary | ICD-10-CM | POA: Diagnosis not present

## 2020-04-28 DIAGNOSIS — M9903 Segmental and somatic dysfunction of lumbar region: Secondary | ICD-10-CM | POA: Diagnosis not present

## 2020-04-28 DIAGNOSIS — M9902 Segmental and somatic dysfunction of thoracic region: Secondary | ICD-10-CM | POA: Diagnosis not present

## 2020-04-28 DIAGNOSIS — M9901 Segmental and somatic dysfunction of cervical region: Secondary | ICD-10-CM | POA: Diagnosis not present

## 2020-04-29 DIAGNOSIS — S134XXA Sprain of ligaments of cervical spine, initial encounter: Secondary | ICD-10-CM | POA: Diagnosis not present

## 2020-04-29 DIAGNOSIS — S233XXA Sprain of ligaments of thoracic spine, initial encounter: Secondary | ICD-10-CM | POA: Diagnosis not present

## 2020-04-29 DIAGNOSIS — M9903 Segmental and somatic dysfunction of lumbar region: Secondary | ICD-10-CM | POA: Diagnosis not present

## 2020-04-29 DIAGNOSIS — M9901 Segmental and somatic dysfunction of cervical region: Secondary | ICD-10-CM | POA: Diagnosis not present

## 2020-04-29 DIAGNOSIS — M9902 Segmental and somatic dysfunction of thoracic region: Secondary | ICD-10-CM | POA: Diagnosis not present

## 2020-05-04 DIAGNOSIS — S233XXA Sprain of ligaments of thoracic spine, initial encounter: Secondary | ICD-10-CM | POA: Diagnosis not present

## 2020-05-04 DIAGNOSIS — M9901 Segmental and somatic dysfunction of cervical region: Secondary | ICD-10-CM | POA: Diagnosis not present

## 2020-05-04 DIAGNOSIS — M9902 Segmental and somatic dysfunction of thoracic region: Secondary | ICD-10-CM | POA: Diagnosis not present

## 2020-05-04 DIAGNOSIS — M9903 Segmental and somatic dysfunction of lumbar region: Secondary | ICD-10-CM | POA: Diagnosis not present

## 2020-05-04 DIAGNOSIS — S134XXA Sprain of ligaments of cervical spine, initial encounter: Secondary | ICD-10-CM | POA: Diagnosis not present

## 2020-05-07 ENCOUNTER — Ambulatory Visit (INDEPENDENT_AMBULATORY_CARE_PROVIDER_SITE_OTHER): Payer: Medicare Other | Admitting: Family Medicine

## 2020-05-07 ENCOUNTER — Other Ambulatory Visit: Payer: Self-pay

## 2020-05-07 ENCOUNTER — Encounter: Payer: Self-pay | Admitting: Family Medicine

## 2020-05-07 VITALS — BP 139/78 | HR 59 | Temp 97.8°F | Ht 66.0 in | Wt 264.0 lb

## 2020-05-07 DIAGNOSIS — I152 Hypertension secondary to endocrine disorders: Secondary | ICD-10-CM

## 2020-05-07 DIAGNOSIS — E1169 Type 2 diabetes mellitus with other specified complication: Secondary | ICD-10-CM | POA: Diagnosis not present

## 2020-05-07 DIAGNOSIS — E785 Hyperlipidemia, unspecified: Secondary | ICD-10-CM | POA: Diagnosis not present

## 2020-05-07 DIAGNOSIS — E1159 Type 2 diabetes mellitus with other circulatory complications: Secondary | ICD-10-CM | POA: Diagnosis not present

## 2020-05-07 LAB — BAYER DCA HB A1C WAIVED: HB A1C (BAYER DCA - WAIVED): 5.7 % (ref <7.0)

## 2020-05-07 NOTE — Progress Notes (Signed)
BP 139/78   Pulse (!) 59   Temp 97.8 F (36.6 C)   Ht 5\' 6"  (1.676 m)   Wt 264 lb (119.7 kg)   SpO2 94%   BMI 42.61 kg/m    Subjective:   Patient ID: Steve Williams, male    DOB: 02/23/1956, 64 y.o.   MRN: [id number]  HPI: Steve Williams is a 64 y.o. male presenting on 05/07/2020 for Medical Management of Chronic Issues and Diabetes   HPI Type 2 diabetes mellitus Patient comes in today for recheck of his diabetes. Patient has been currently taking no medication currently, A1c is 5.7. Patient is currently on an ACE inhibitor/ARB. Patient has seen an ophthalmologist this year. Patient denies any issues with their feet. The symptom started onset as an adult hypertension hyperlipidemia ARE RELATED TO DM   Hypertension Patient is currently on losartan hydrochlorothiazide, and their blood pressure today is 139/78. Patient denies any lightheadedness or dizziness. Patient denies headaches, blurred vision, chest pains, shortness of breath, or weakness. Denies any side effects from medication and is content with current medication.   Hyperlipidemia Patient is coming in for recheck of his hyperlipidemia. The patient is currently taking simvastatin. They deny any issues with myalgias or history of liver damage from it. They deny any focal numbness or weakness or chest pain.   Relevant past medical, surgical, family and social history reviewed and updated as indicated. Interim medical history since our last visit reviewed. Allergies and medications reviewed and updated.  Review of Systems  Constitutional: Negative for chills and fever.  Eyes: Negative for visual disturbance.  Respiratory: Negative for shortness of breath and wheezing.   Cardiovascular: Negative for chest pain and leg swelling.  Musculoskeletal: Negative for back pain and gait problem.  Skin: Negative for rash.  Neurological: Negative for dizziness, weakness and light-headedness.  All other systems reviewed and are  negative.   Per HPI unless specifically indicated above   Allergies as of 05/07/2020      Reactions   Lisinopril    Erythromycin Rash      Medication List       Accurate as of May 07, 2020  8:49 AM. If you have any questions, ask your nurse or doctor.        aspirin 81 MG tablet Take 81 mg by mouth daily.   B-D 3CC LUER-LOK SYR 22GX1-1/2 22G X 1-1/2" 3 ML Misc Generic drug: SYRINGE-NEEDLE (DISP) 3 ML USE TO GIVE TESTOSTERONE   BD Disp Needles 18G X 1-1/2" Misc Generic drug: NEEDLE (DISP) 18 G USE TO DRAW UP TESTOSTERONE   fluticasone 50 MCG/ACT nasal spray Commonly known as: FLONASE Place into both nostrils daily.   losartan-hydrochlorothiazide 100-25 MG tablet Commonly known as: HYZAAR Take 1 tablet by mouth daily.   meloxicam 15 MG tablet Commonly known as: MOBIC Take 1 tablet (15 mg total) by mouth daily.   montelukast 10 MG tablet Commonly known as: SINGULAIR Take 1 tablet (10 mg total) by mouth at bedtime.   sertraline 100 MG tablet Commonly known as: ZOLOFT Take 1 tablet (100 mg total) by mouth daily.   sildenafil 20 MG tablet Commonly known as: REVATIO TAKE 1-5 TABLETS BY MOUTH ONCE DAILY AS NEEDED FOR ERECTILE DYSFUNCTION   simvastatin 40 MG tablet Commonly known as: ZOCOR Take 1 tablet (40 mg total) by mouth daily.   testosterone cypionate 200 MG/ML injection Commonly known as: DEPOTESTOSTERONE CYPIONATE INJECT 0.5ML INTRAMUSCULARLY EVERY SEVEN DAYS  Objective:   BP 139/78   Pulse (!) 59   Temp 97.8 F (36.6 C)   Ht 5\' 6"  (1.676 m)   Wt 264 lb (119.7 kg)   SpO2 94%   BMI 42.61 kg/m   Wt Readings from Last 3 Encounters:  05/07/20 264 lb (119.7 kg)  02/05/20 264 lb (119.7 kg)  11/06/19 260 lb (117.9 kg)    Physical Exam Vitals and nursing note reviewed.  Constitutional:      General: He is not in acute distress.    Appearance: He is well-developed. He is not diaphoretic.  Eyes:     General: No scleral icterus.     Conjunctiva/sclera: Conjunctivae normal.  Neck:     Thyroid: No thyromegaly.  Cardiovascular:     Rate and Rhythm: Normal rate and regular rhythm.     Heart sounds: Normal heart sounds. No murmur heard.   Pulmonary:     Effort: Pulmonary effort is normal. No respiratory distress.     Breath sounds: Normal breath sounds. No wheezing.  Musculoskeletal:        General: Normal range of motion.     Cervical back: Neck supple.  Lymphadenopathy:     Cervical: No cervical adenopathy.  Skin:    General: Skin is warm and dry.     Findings: No rash.  Neurological:     Mental Status: He is alert and oriented to person, place, and time.     Coordination: Coordination normal.  Psychiatric:        Behavior: Behavior normal.       Assessment & Plan:   Problem List Items Addressed This Visit      Cardiovascular and Mediastinum   Hypertension associated with diabetes ([place])     Endocrine   DM2 (diabetes mellitus, type 2) ([place]) - Primary   Relevant Orders   Bayer DCA Hb A1c Waived (Completed)   Hyperlipidemia associated with type 2 diabetes mellitus ([facility])      A1c looks great, allowing permissive hypertension.  No change in medication. Follow up plan: Return in about 6 months (around 11/05/2020), or if symptoms worsen or fail to improve, for Diabetes and hypertension and cholesterol.  Counseling provided for all of the vaccine components Orders Placed This Encounter  Procedures  . Bayer [facility] Hb A1c Waived    Caryl Pina, MD [facility] 05/07/2020, 8:49 AM

## 2020-05-10 DIAGNOSIS — M9901 Segmental and somatic dysfunction of cervical region: Secondary | ICD-10-CM | POA: Diagnosis not present

## 2020-05-10 DIAGNOSIS — S134XXA Sprain of ligaments of cervical spine, initial encounter: Secondary | ICD-10-CM | POA: Diagnosis not present

## 2020-05-10 DIAGNOSIS — M9903 Segmental and somatic dysfunction of lumbar region: Secondary | ICD-10-CM | POA: Diagnosis not present

## 2020-05-10 DIAGNOSIS — S233XXA Sprain of ligaments of thoracic spine, initial encounter: Secondary | ICD-10-CM | POA: Diagnosis not present

## 2020-05-10 DIAGNOSIS — M9902 Segmental and somatic dysfunction of thoracic region: Secondary | ICD-10-CM | POA: Diagnosis not present

## 2020-05-13 DIAGNOSIS — S134XXA Sprain of ligaments of cervical spine, initial encounter: Secondary | ICD-10-CM | POA: Diagnosis not present

## 2020-05-13 DIAGNOSIS — S233XXA Sprain of ligaments of thoracic spine, initial encounter: Secondary | ICD-10-CM | POA: Diagnosis not present

## 2020-05-13 DIAGNOSIS — M9902 Segmental and somatic dysfunction of thoracic region: Secondary | ICD-10-CM | POA: Diagnosis not present

## 2020-05-13 DIAGNOSIS — M9901 Segmental and somatic dysfunction of cervical region: Secondary | ICD-10-CM | POA: Diagnosis not present

## 2020-05-13 DIAGNOSIS — M9903 Segmental and somatic dysfunction of lumbar region: Secondary | ICD-10-CM | POA: Diagnosis not present

## 2020-05-19 DIAGNOSIS — S134XXA Sprain of ligaments of cervical spine, initial encounter: Secondary | ICD-10-CM | POA: Diagnosis not present

## 2020-05-19 DIAGNOSIS — M9901 Segmental and somatic dysfunction of cervical region: Secondary | ICD-10-CM | POA: Diagnosis not present

## 2020-05-19 DIAGNOSIS — M9902 Segmental and somatic dysfunction of thoracic region: Secondary | ICD-10-CM | POA: Diagnosis not present

## 2020-05-19 DIAGNOSIS — M9903 Segmental and somatic dysfunction of lumbar region: Secondary | ICD-10-CM | POA: Diagnosis not present

## 2020-05-19 DIAGNOSIS — S233XXA Sprain of ligaments of thoracic spine, initial encounter: Secondary | ICD-10-CM | POA: Diagnosis not present

## 2020-05-26 DIAGNOSIS — M9902 Segmental and somatic dysfunction of thoracic region: Secondary | ICD-10-CM | POA: Diagnosis not present

## 2020-05-26 DIAGNOSIS — M9901 Segmental and somatic dysfunction of cervical region: Secondary | ICD-10-CM | POA: Diagnosis not present

## 2020-05-26 DIAGNOSIS — S134XXA Sprain of ligaments of cervical spine, initial encounter: Secondary | ICD-10-CM | POA: Diagnosis not present

## 2020-05-26 DIAGNOSIS — M9903 Segmental and somatic dysfunction of lumbar region: Secondary | ICD-10-CM | POA: Diagnosis not present

## 2020-05-26 DIAGNOSIS — S233XXA Sprain of ligaments of thoracic spine, initial encounter: Secondary | ICD-10-CM | POA: Diagnosis not present

## 2020-05-27 ENCOUNTER — Other Ambulatory Visit: Payer: Self-pay

## 2020-05-27 DIAGNOSIS — E291 Testicular hypofunction: Secondary | ICD-10-CM

## 2020-05-28 ENCOUNTER — Other Ambulatory Visit: Payer: Self-pay

## 2020-05-28 ENCOUNTER — Other Ambulatory Visit: Payer: Medicare Other

## 2020-05-28 DIAGNOSIS — E291 Testicular hypofunction: Secondary | ICD-10-CM

## 2020-05-29 LAB — TESTOSTERONE: Testosterone: 541 ng/dL (ref 264–916)

## 2020-05-29 LAB — PSA: Prostate Specific Ag, Serum: 1.5 ng/mL (ref 0.0–4.0)

## 2020-06-01 ENCOUNTER — Ambulatory Visit (INDEPENDENT_AMBULATORY_CARE_PROVIDER_SITE_OTHER): Payer: Medicare Other | Admitting: Urology

## 2020-06-01 ENCOUNTER — Encounter: Payer: Self-pay | Admitting: Urology

## 2020-06-01 ENCOUNTER — Other Ambulatory Visit: Payer: Self-pay

## 2020-06-01 ENCOUNTER — Ambulatory Visit: Payer: BC Managed Care – PPO | Admitting: Urology

## 2020-06-01 VITALS — BP 166/94 | HR 76 | Temp 98.3°F | Ht 66.0 in | Wt 264.0 lb

## 2020-06-01 DIAGNOSIS — E291 Testicular hypofunction: Secondary | ICD-10-CM

## 2020-06-01 LAB — URINALYSIS, ROUTINE W REFLEX MICROSCOPIC
Bilirubin, UA: NEGATIVE
Glucose, UA: NEGATIVE
Ketones, UA: NEGATIVE
Leukocytes,UA: NEGATIVE
Nitrite, UA: NEGATIVE
Protein,UA: NEGATIVE
RBC, UA: NEGATIVE
Specific Gravity, UA: 1.015 (ref 1.005–1.030)
Urobilinogen, Ur: 0.2 mg/dL (ref 0.2–1.0)
pH, UA: 7 (ref 5.0–7.5)

## 2020-06-01 NOTE — Progress Notes (Signed)
Urological Symptom Review  Patient is experiencing the following symptoms: Frequent urination Hard to postpone urination Get up at night to urinate Leakage of urine   Review of Systems  Gastrointestinal (upper)  : Negative for upper GI symptoms  Gastrointestinal (lower) : Negative for lower GI symptoms  Constitutional : Negative for symptoms  Skin: Negative for skin symptoms  Eyes: Negative for eye symptoms  Ear/Nose/Throat : Negative for Ear/Nose/Throat symptoms  Hematologic/Lymphatic: Negative for Hematologic/Lymphatic symptoms  Cardiovascular : Negative for cardiovascular symptoms  Respiratory : Negative for respiratory symptoms  Endocrine: Negative for endocrine symptoms  Musculoskeletal: Back pain Joint pain  Neurological: Negative for neurological symptoms  Psychologic: Negative for psychiatric symptoms

## 2020-06-01 NOTE — Progress Notes (Signed)
History of Present Illness:   OAB 11.2.2021: Pt is experiencing urinary frequency (primarily in the morning). He allows that his significant tea and diet mountain dew intake may account for his urinary symptoms. Pt has a good FOS and feels he is able to empty his bladder completely. Pt denies any recent dysuria, but did have an episode of gross hematuria in April, he was prescribed macrobid by his PCP and has not seen a recurrence.  Hypogonadism Pt testosterone - 541  PSA - 1.5 11.2.2021: Pt continues to supplement with testosterone cypionate   IPSS Questionnaire (AUA-7): Over the past month.   1)  How often have you had a sensation of not emptying your bladder completely after you finish urinating?  0 - Not at all  2)  How often have you had to urinate again less than two hours after you finished urinating? 5 - Almost always  3)  How often have you found you stopped and started again several times when you urinated?  0 - Not at all  4) How difficult have you found it to postpone urination?  4 - More than half the time  5) How often have you had a weak urinary stream?  0 - Not at all  6) How often have you had to push or strain to begin urination?  0 - Not at all  7) How many times did you most typically get up to urinate from the time you went to bed until the time you got up in the morning?  1 - 1 time  Total score:  0-7 mildly symptomatic   8-19 moderately symptomatic   20-35 severely symptomatic  QOL score: 4   (below copied from [facility] records):  CC/HPI: I have an overactive bladder.  First seen in June, 2018. Early in 2018, he had significant lower urinary tract symptomatology. With limiting caffeinated/carbonated beverages, this improved.   11.3.2020: He has recently begun drinking more caffeine and has noted increased freq (4-5x an hour).  CC: I have a decreased testosterone level. HPI: Steve Williams is a 64 year-old male established patient who is here for a decreased  testosterone level.    Initially seen 6.12.2018 for evaluation and management of low testosterone level. He had been on testosterone repletion in the past. Apparently, he did not feel like injections helped adequately. Appaently his first testosterone withdrawn in February, 2017 and was 31. The specimen was drawn at 1:56 PM. Subsequent testosterone levels are as follows:  3.24.2017--109  9.1.2017, 120  1.3.2018, 75  4.9.2018, 44   He states that he has been on testosterone repletion for about 5 years. First, he was on transdermal jels which caused rash. He was also on patches. Apparently, neither of these worked adequately. He has been on testosterone injections for some time. Currently, he gets 400 mg 4 week basis. He has his injections in his family practice office. For the first 2-2-1/2 weeks, he does have excellent increase in energy. However, the last week and a half, he notes that he has decreased energy and libido.   At his first visit, it was recommended that he take lower doses but on a more frequent basis. He was put on 100 mg of testosterone cypionate every week.   11.3.2020: Since last visit he has lost upwards of 50 lbs and has been feeling a lot more energetic. Most recent PSA 1.0 -- this elevation is expected with him being on testosterone. Sept 22 2020 (thurs) his testosterone was  754 -- he injects on Sundays.   CC: I am having trouble with my erections. HPI:   11.3.2020: He continues to manage his ED w/ sildenafil -- this is adequately effective for him.   Past Medical History:  Diagnosis Date  . Allergy   . Anxiety   . Arthritis   . Asthma   . Cancer ([place])   . Depression   . Diabetes mellitus without complication ([place])   . Hyperlipidemia   . Hypertension     Past Surgical History:  Procedure Laterality Date  . CARPAL BONE EXCISION Bilateral   . KNEE ARTHROSCOPY Left   . KNEE SURGERY Left 1974  . lipoma removed    . TRIGGER FINGER RELEASE Right     Home  Medications:  Allergies as of 06/01/2020      Reactions   Lisinopril    Erythromycin Rash      Medication List       Accurate as of June 01, 2020 10:27 AM. If you have any questions, ask your nurse or doctor.        aspirin 81 MG tablet Take 81 mg by mouth daily.   B-D 3CC LUER-LOK SYR 22GX1-1/2 22G X 1-1/2" 3 ML Misc Generic drug: SYRINGE-NEEDLE (DISP) 3 ML USE TO GIVE TESTOSTERONE   BD Disp Needles 18G X 1-1/2" Misc Generic drug: NEEDLE (DISP) 18 G USE TO DRAW UP TESTOSTERONE   fluticasone 50 MCG/ACT nasal spray Commonly known as: FLONASE Place into both nostrils daily.   losartan-hydrochlorothiazide 100-25 MG tablet Commonly known as: HYZAAR Take 1 tablet by mouth daily.   meloxicam 15 MG tablet Commonly known as: MOBIC Take 1 tablet (15 mg total) by mouth daily.   montelukast 10 MG tablet Commonly known as: SINGULAIR Take 1 tablet (10 mg total) by mouth at bedtime.   sertraline 100 MG tablet Commonly known as: ZOLOFT Take 1 tablet (100 mg total) by mouth daily.   sildenafil 20 MG tablet Commonly known as: REVATIO TAKE 1-5 TABLETS BY MOUTH ONCE DAILY AS NEEDED FOR ERECTILE DYSFUNCTION   simvastatin 40 MG tablet Commonly known as: ZOCOR Take 1 tablet (40 mg total) by mouth daily.   testosterone cypionate 200 MG/ML injection Commonly known as: DEPOTESTOSTERONE CYPIONATE INJECT 0.5ML INTRAMUSCULARLY EVERY SEVEN DAYS       Allergies:  Allergies  Allergen Reactions  . Lisinopril   . Erythromycin Rash    Family History  Problem Relation Age of Onset  . Heart disease Mother   . Hypertension Mother   . Diabetes Mother   . Heart disease Father   . Cancer Father        liver  . Diabetes Father   . Hypertension Sister   . Diabetes Sister   . Migraines Son     Social History:  reports that he has never smoked. He has never used smokeless tobacco. He reports that he does not drink alcohol and does not use drugs.  ROS: A complete review of  systems was performed.  All systems are negative except for pertinent findings as noted.  Physical Exam:  Vital signs in last 24 hours: There were no vitals taken for this visit. Constitutional:  Alert and oriented, No acute distress Cardiovascular: Regular rate  GI: Abdomen is soft, nontender, nondistended, no abdominal masses. No CVAT. No hernias. Genitourinary: Normal male phallus, testes are descended bilaterally and non-tender and without masses, scrotum is normal in appearance without lesions or masses, perineum is normal on inspection. Prostate feels  about 20 grams. Neurologic: Grossly intact, no focal deficits Psychiatric: Normal mood and affect  Laboratory Data:  No results for input(s): WBC, HGB, HCT, PLT in the last 72 hours.  No results for input(s): NA, K, CL, GLUCOSE, BUN, CALCIUM, CREATININE in the last 72 hours.  Invalid input(s): CO3   No results found for this or any previous visit (from the past 24 hour(s)). No results found for this or any previous visit (from the past 240 hour(s)).  Renal Function: No results for input(s): CREATININE in the last 168 hours. CrCl cannot be calculated (Patient's most recent lab result is older than the maximum 21 days allowed.).  Radiologic Imaging: No results found.  Impression/Assessment:  Hypogonadism: Pt is compliant with his testosterone regimen and presents no concern at this time.  OAB: Pt stable  ED: Pt stable  Plan:   1. Pt continued on testosterone.  2. Pt advised regarding his caffeine intake and OAB and provided with/oriented to self-help measures via OAB worksheet.  3. F/U in one year for OV, PSA, and Testosterone panel  CC: Dr. Vonna Kotyk Dettinger

## 2020-06-23 DIAGNOSIS — S134XXA Sprain of ligaments of cervical spine, initial encounter: Secondary | ICD-10-CM | POA: Diagnosis not present

## 2020-06-23 DIAGNOSIS — M9903 Segmental and somatic dysfunction of lumbar region: Secondary | ICD-10-CM | POA: Diagnosis not present

## 2020-06-23 DIAGNOSIS — S233XXA Sprain of ligaments of thoracic spine, initial encounter: Secondary | ICD-10-CM | POA: Diagnosis not present

## 2020-06-23 DIAGNOSIS — M9902 Segmental and somatic dysfunction of thoracic region: Secondary | ICD-10-CM | POA: Diagnosis not present

## 2020-06-23 DIAGNOSIS — M9901 Segmental and somatic dysfunction of cervical region: Secondary | ICD-10-CM | POA: Diagnosis not present

## 2020-07-20 ENCOUNTER — Encounter: Payer: Self-pay | Admitting: Family

## 2020-07-20 ENCOUNTER — Ambulatory Visit (INDEPENDENT_AMBULATORY_CARE_PROVIDER_SITE_OTHER): Payer: Medicare Other | Admitting: Family

## 2020-07-20 DIAGNOSIS — J019 Acute sinusitis, unspecified: Secondary | ICD-10-CM

## 2020-07-20 MED ORDER — PREDNISONE 10 MG (21) PO TBPK: ORAL_TABLET | ORAL | 0 refills | Status: DC

## 2020-07-20 MED ORDER — AMOXICILLIN-POT CLAVULANATE 875-125 MG PO TABS: 1.0000 | ORAL_TABLET | Freq: Two times a day (BID) | ORAL | 0 refills | Status: DC

## 2020-07-20 NOTE — Progress Notes (Signed)
° °  Virtual Visit via telephone Note Due to COVID-19 pandemic this visit was conducted virtually. This visit type was conducted due to national recommendations for restrictions regarding the COVID-19 Pandemic (e.g. social distancing, sheltering in place) in an effort to limit this patient's exposure and mitigate transmission in our community. All issues noted in this document were discussed and addressed.  A physical exam was not performed with this format.  I connected with Steve Williams on 07/20/20 at 9:46 AM  by telephone and verified that I am speaking with the correct person using two identifiers. Steve Williams is currently located at restaurant   and no one is currently with  during visit. The provider, Evelina Dun, FNP is located in their office at time of visit.  I discussed the limitations, risks, security and privacy concerns of performing an evaluation and management service by telephone and the availability of in person appointments. I also discussed with the patient that there may be a patient responsible charge related to this service. The patient expressed understanding and agreed to proceed.   History and Present Illness:  Sinusitis This is a new problem. The current episode started in the past 7 days. The problem has been gradually worsening since onset. There has been no fever. His pain is at a severity of 1/10. The pain is mild. Associated symptoms include congestion, coughing, a hoarse voice, sinus pressure, sneezing and a sore throat. Pertinent negatives include no diaphoresis, ear pain, headaches or shortness of breath. Past treatments include oral decongestants (flonase and singulair).      Review of Systems  Constitutional: Negative for diaphoresis.  HENT: Positive for congestion, hoarse voice, sinus pressure, sneezing and sore throat. Negative for ear pain.   Respiratory: Positive for cough. Negative for shortness of breath.   Neurological: Negative for headaches.  All  other systems reviewed and are negative.    Observations/Objective: No SOB or distress noted   Assessment and Plan: 1. Acute sinusitis, recurrence not specified, unspecified location - Take meds as prescribed - Use a cool mist humidifier  -Use saline nose sprays frequently -Force fluids -For any cough or congestion  Use plain Mucinex- regular strength or max strength is fine -For fever or aces or pains- take tylenol or ibuprofen. -Throat lozenges if help -RTO if symptoms worsen or do not improve  - predniSONE (STERAPRED UNI-PAK 21 TAB) 10 MG (21) TBPK tablet; Use as directed  Dispense: 21 tablet; Refill: 0 - amoxicillin-clavulanate (AUGMENTIN) 875-125 MG tablet; Take 1 tablet by mouth 2 (two) times daily.  Dispense: 14 tablet; Refill: 0     I discussed the assessment and treatment plan with the patient. The patient was provided an opportunity to ask questions and all were answered. The patient agreed with the plan and demonstrated an understanding of the instructions.   The patient was advised to call back or seek an in-person evaluation if the symptoms worsen or if the condition fails to improve as anticipated.  The above assessment and management plan was discussed with the patient. The patient verbalized understanding of and has agreed to the management plan. Patient is aware to call the clinic if symptoms persist or worsen. Patient is aware when to return to the clinic for a follow-up visit. Patient educated on when it is appropriate to go to the emergency department.   Time call ended:  9:58 AM   I provided 12 minutes of non-face-to-face time during this encounter.    Evelina Dun, FNP

## 2020-07-21 DIAGNOSIS — M9901 Segmental and somatic dysfunction of cervical region: Secondary | ICD-10-CM | POA: Diagnosis not present

## 2020-07-21 DIAGNOSIS — M9903 Segmental and somatic dysfunction of lumbar region: Secondary | ICD-10-CM | POA: Diagnosis not present

## 2020-07-21 DIAGNOSIS — S134XXA Sprain of ligaments of cervical spine, initial encounter: Secondary | ICD-10-CM | POA: Diagnosis not present

## 2020-07-21 DIAGNOSIS — S233XXA Sprain of ligaments of thoracic spine, initial encounter: Secondary | ICD-10-CM | POA: Diagnosis not present

## 2020-07-21 DIAGNOSIS — M9902 Segmental and somatic dysfunction of thoracic region: Secondary | ICD-10-CM | POA: Diagnosis not present

## 2020-08-13 ENCOUNTER — Ambulatory Visit (INDEPENDENT_AMBULATORY_CARE_PROVIDER_SITE_OTHER): Payer: Medicare Other | Admitting: Family

## 2020-08-13 ENCOUNTER — Encounter: Payer: Self-pay | Admitting: Family

## 2020-08-13 DIAGNOSIS — U071 COVID-19: Secondary | ICD-10-CM | POA: Diagnosis not present

## 2020-08-13 MED ORDER — PROMETHAZINE-DM 6.25-15 MG/5ML PO SYRP: 5.0000 mL | ORAL_SOLUTION | Freq: Three times a day (TID) | ORAL | 0 refills | Status: DC | PRN

## 2020-08-13 MED ORDER — DEXAMETHASONE 6 MG PO TABS: 6.0000 mg | ORAL_TABLET | Freq: Two times a day (BID) | ORAL | 0 refills | Status: DC

## 2020-08-13 MED ORDER — ALBUTEROL SULFATE HFA 108 (90 BASE) MCG/ACT IN AERS: 2.0000 | INHALATION_SPRAY | Freq: Four times a day (QID) | RESPIRATORY_TRACT | 2 refills | Status: DC | PRN

## 2020-08-13 NOTE — Progress Notes (Signed)
Virtual Visit via telephone Note Due to COVID-19 pandemic this visit was conducted virtually. This visit type was conducted due to national recommendations for restrictions regarding the COVID-19 Pandemic (e.g. social distancing, sheltering in place) in an effort to limit this patient's exposure and mitigate transmission in our community. All issues noted in this document were discussed and addressed.  A physical exam was not performed with this format.  I connected with Steve Williams on 08/13/20 at 1:18 pm  by telephone and verified that I am speaking with the correct person using two identifiers. Steve Williams is currently located at home and wife is currently with him  during visit. The provider, Evelina Dun, FNP is located in their office at time of visit.  I discussed the limitations, risks, security and privacy concerns of performing an evaluation and management service by telephone and the availability of in person appointments. I also discussed with the patient that there may be a patient responsible charge related to this service. The patient expressed understanding and agreed to proceed.   History and Present Illness:  PT calls the office today with cough, myalgia, headache and congestion that started yesterday. He took a home test that was positive for COVID.   Cough This is a new problem. The current episode started yesterday. The problem has been gradually worsening. The problem occurs every few minutes. The cough is non-productive. Associated symptoms include chills, a fever, headaches, myalgias, nasal congestion, rhinorrhea and a sore throat. Pertinent negatives include no shortness of breath or wheezing. He has tried rest for the symptoms. The treatment provided mild relief.    Review of Systems  Constitutional: Positive for chills and fever.  HENT: Positive for rhinorrhea and sore throat.   Respiratory: Positive for cough. Negative for shortness of breath and wheezing.    Musculoskeletal: Positive for myalgias.  Neurological: Positive for headaches.     Observations/Objective: No SOB or distress noted   Assessment and Plan: 1. COVID-19 virus detected Rest Tylenol  Quarantine for 5 days from start of symptoms  RTO if symptoms worsen or do not improve  - MyChart COVID-19 home monitoring program; Future - promethazine-dextromethorphan (PROMETHAZINE-DM) 6.25-15 MG/5ML syrup; Take 5 mLs by mouth 3 (three) times daily as needed for cough.  Dispense: 118 mL; Refill: 0 - dexamethasone (DECADRON) 6 MG tablet; Take 1 tablet (6 mg total) by mouth 2 (two) times daily with a meal.  Dispense: 10 tablet; Refill: 0 - albuterol (VENTOLIN HFA) 108 (90 Base) MCG/ACT inhaler; Inhale 2 puffs into the lungs every 6 (six) hours as needed for wheezing or shortness of breath.  Dispense: 8 g; Refill: 2     I discussed the assessment and treatment plan with the patient. The patient was provided an opportunity to ask questions and all were answered. The patient agreed with the plan and demonstrated an understanding of the instructions.   The patient was advised to call back or seek an in-person evaluation if the symptoms worsen or if the condition fails to improve as anticipated.  The above assessment and management plan was discussed with the patient. The patient verbalized understanding of and has agreed to the management plan. Patient is aware to call the clinic if symptoms persist or worsen. Patient is aware when to return to the clinic for a follow-up visit. Patient educated on when it is appropriate to go to the emergency department.   Time call ended:  1:30 pm   I provided 12 minutes of non-face-to-face  time during this encounter.    Evelina Dun, FNP

## 2020-08-25 ENCOUNTER — Encounter: Payer: Self-pay | Admitting: Family Medicine

## 2020-08-25 ENCOUNTER — Other Ambulatory Visit: Payer: Self-pay

## 2020-08-25 ENCOUNTER — Ambulatory Visit (INDEPENDENT_AMBULATORY_CARE_PROVIDER_SITE_OTHER): Payer: Medicare Other | Admitting: Family Medicine

## 2020-08-25 VITALS — BP 149/82 | HR 81 | Ht 66.0 in | Wt 264.0 lb

## 2020-08-25 DIAGNOSIS — Z0001 Encounter for general adult medical examination with abnormal findings: Secondary | ICD-10-CM | POA: Diagnosis not present

## 2020-08-25 DIAGNOSIS — Z0000 Encounter for general adult medical examination without abnormal findings: Secondary | ICD-10-CM

## 2020-08-25 DIAGNOSIS — J329 Chronic sinusitis, unspecified: Secondary | ICD-10-CM

## 2020-08-25 NOTE — Progress Notes (Signed)
Subjective:   Steve Williams is a 65 y.o. male who presents for a Welcome to Medicare exam.   Review of Systems: Review of Systems  Constitutional: Negative for chills and fever.  HENT: Negative for ear pain and tinnitus.   Eyes: Negative for blurred vision and pain.  Respiratory: Negative for cough, shortness of breath and wheezing.   Cardiovascular: Negative for chest pain, palpitations and leg swelling.  Gastrointestinal: Negative for abdominal pain, blood in stool, constipation, diarrhea and melena.  Genitourinary: Positive for frequency and urgency. Negative for dysuria and hematuria.  Musculoskeletal: Negative for back pain, joint pain and myalgias.  Skin: Negative for rash.  Neurological: Negative for dizziness, sensory change, focal weakness, weakness and headaches.  Psychiatric/Behavioral: Negative for depression and suicidal ideas.    Cardiac Risk Factors include: advanced age (>855men, 66>65 women);obesity (BMI >30kg/m2);sedentary lifestyle;male gender     Objective:    Today's Vitals   08/25/20 1427  BP: (!) 149/82  Pulse: 81  SpO2: 92%  Weight: 264 lb (119.7 kg)  Height: 5\' 6"  (1.676 m)   Body mass index is 42.61 kg/m.  Medications Outpatient Encounter Medications as of 08/25/2020  Medication Sig  . aspirin 81 MG tablet Take 81 mg by mouth daily.  . B-D 3CC LUER-LOK SYR 22GX1" 22G X 1" 3 ML MISC USE TO GIVE TESTOSTERONE  . B-D 3CC LUER-LOK SYR 22GX1-1/2 22G X 1-1/2" 3 ML MISC USE TO GIVE TESTOSTERONE  . BD DISP NEEDLES 18G X 1-1/2" MISC USE TO DRAW UP TESTOSTERONE  . fluticasone (FLONASE) 50 MCG/ACT nasal spray Place into both nostrils daily.  Marland Kitchen. losartan-hydrochlorothiazide (HYZAAR) 100-25 MG tablet Take 1 tablet by mouth daily.  . meloxicam (MOBIC) 15 MG tablet Take 1 tablet (15 mg total) by mouth daily.  . montelukast (SINGULAIR) 10 MG tablet Take 1 tablet (10 mg total) by mouth at bedtime.  . sertraline (ZOLOFT) 100 MG tablet Take 1 tablet (100 mg total)  by mouth daily.  . sildenafil (REVATIO) 20 MG tablet TAKE 1-5 TABLETS BY MOUTH ONCE DAILY AS NEEDED FOR ERECTILE DYSFUNCTION  . simvastatin (ZOCOR) 40 MG tablet Take 1 tablet (40 mg total) by mouth daily.  Marland Kitchen. testosterone cypionate (DEPOTESTOSTERONE CYPIONATE) 200 MG/ML injection INJECT 0.5ML INTRAMUSCULARLY EVERY SEVEN DAYS  . [DISCONTINUED] albuterol (VENTOLIN HFA) 108 (90 Base) MCG/ACT inhaler Inhale 2 puffs into the lungs every 6 (six) hours as needed for wheezing or shortness of breath.  . [DISCONTINUED] amoxicillin-clavulanate (AUGMENTIN) 875-125 MG tablet Take 1 tablet by mouth 2 (two) times daily.  . [DISCONTINUED] dexamethasone (DECADRON) 6 MG tablet Take 1 tablet (6 mg total) by mouth 2 (two) times daily with a meal.  . [DISCONTINUED] predniSONE (STERAPRED UNI-PAK 21 TAB) 10 MG (21) TBPK tablet Use as directed  . [DISCONTINUED] promethazine-dextromethorphan (PROMETHAZINE-DM) 6.25-15 MG/5ML syrup Take 5 mLs by mouth 3 (three) times daily as needed for cough.   No facility-administered encounter medications on file as of 08/25/2020.     History: Past Medical History:  Diagnosis Date  . Allergy   . Anxiety   . Arthritis   . Asthma   . Cancer ([facility])   . Depression   . Diabetes mellitus without complication ([facility])   . Hyperlipidemia   . Hypertension    Past Surgical History:  Procedure Laterality Date  . CARPAL BONE EXCISION Bilateral   . KNEE ARTHROSCOPY Left   . KNEE SURGERY Left 1974  . lipoma removed    . TRIGGER FINGER RELEASE Right  Family History  Problem Relation Age of Onset  . Heart disease Mother   . Hypertension Mother   . Diabetes Mother   . Heart disease Father   . Cancer Father        liver  . Diabetes Father   . Hypertension Sister   . Diabetes Sister   . Migraines Son    Social History   Occupational History  . Not on file  Tobacco Use  . Smoking status: Never Smoker  . Smokeless tobacco: Never Used  Substance and Sexual Activity  . Alcohol  use: No  . Drug use: No  . Sexual activity: Not on file   Tobacco Counseling Counseling given: Not Answered   Immunizations and Health Maintenance Immunization History  Administered Date(s) Administered  . Influenza, Seasonal, Injecte, Preservative Fre 07/03/2016  . Influenza,inj,Quad PF,6+ Mos 05/17/2017  . Influenza-Unspecified 04/29/2015, 05/15/2018  . PFIZER(Purple Top)SARS-COV-2 Vaccination 04/12/2020, 05/03/2020  . Pneumococcal Conjugate-13 01/25/2011  . Pneumococcal Polysaccharide-23 04/07/2016  . Td 11/01/2009  . Tdap 11/01/2009  . Zoster 08/17/2010   There are no preventive care reminders to display for this patient.  Activities of Daily Living In your present state of health, do you have any difficulty performing the following activities: 08/25/2020  Hearing? N  Vision? N  Difficulty concentrating or making decisions? N  Walking or climbing stairs? N  Dressing or bathing? N  Doing errands, shopping? N  Preparing Food and eating ? N  Using the Toilet? N  In the past six months, have you accidently leaked urine? Y  Comment Urology twice per year  Do you have problems with loss of bowel control? N  Managing your Medications? N  Managing your Finances? N  Housekeeping or managing your Housekeeping? N  Some recent data might be hidden    Physical Exam  (optional), or other factors deemed appropriate based on the beneficiary's medical and social history and current clinical standards.  Advanced Directives: Does Patient Have a Medical Advance Directive?: No Would patient like information on creating a medical advance directive?: No - Patient declined    Assessment:    This is a routine wellness  examination for this patient .   Vision/Hearing screen No exam data present  Dietary issues and exercise activities discussed:  Current Exercise Habits: Home exercise routine, Time (Minutes): 30, Frequency (Times/Week): 3, Weekly Exercise (Minutes/Week): 90,  Intensity: Mild  Goals   None     Depression Screen PHQ 2/9 Scores 08/25/2020 05/07/2020 02/05/2020 11/06/2019  PHQ - 2 Score 0 0 0 0     Fall Risk Fall Risk  08/25/2020  Falls in the past year? 0    Cognitive Function     6CIT Screen 08/25/2020  What Year? 0 points  What month? 0 points  What time? 0 points  Count back from 20 0 points  Months in reverse 0 points  Repeat phrase 0 points  Total Score 0    Patient Care Team: Aiyana Stegmann, Fransisca Kaufmann, MD as PCP - General (Family Medicine)     Plan:     Problem List Items Addressed This Visit   None   Visit Diagnoses    Encounter for Medicare annual wellness exam    -  Primary      Patient has had recurrent sinusitis and would like a referral to ENT.  He has been using Flonase but has not used Nettie pot.  I have personally reviewed and noted the following in the patient's chart:   .  Medical and social history . Use of alcohol, tobacco or illicit drugs  . Current medications and supplements . Functional ability and status . Nutritional status . Physical activity . Advanced directives . List of other physicians . Hospitalizations, surgeries, and ER visits in previous 12 months . Vitals . Screenings to include cognitive, depression, and falls . Referrals and appointments  In addition, I have reviewed and discussed with patient certain preventive protocols, quality metrics, and best practice recommendations. A written personalized care plan for preventive services as well as general preventive health recommendations were provided to patient.    Worthy Rancher, MD 08/25/2020

## 2020-08-30 ENCOUNTER — Other Ambulatory Visit: Payer: Self-pay | Admitting: Family Medicine

## 2020-08-31 ENCOUNTER — Encounter: Payer: Self-pay | Admitting: Family Medicine

## 2020-09-01 ENCOUNTER — Ambulatory Visit (INDEPENDENT_AMBULATORY_CARE_PROVIDER_SITE_OTHER): Payer: Medicare Other | Admitting: Family Medicine

## 2020-09-01 DIAGNOSIS — R197 Diarrhea, unspecified: Secondary | ICD-10-CM | POA: Diagnosis not present

## 2020-09-01 MED ORDER — DIPHENOXYLATE-ATROPINE 2.5-0.025 MG PO TABS: 2.0000 | ORAL_TABLET | Freq: Four times a day (QID) | ORAL | 0 refills | Status: DC | PRN

## 2020-09-01 NOTE — Progress Notes (Signed)
Subjective:    Patient ID: Steve Williams, male    DOB: [birth year], 65 y.o.   MRN: [id number]   HPI: Steve Williams is a 65 y.o. male presenting for onset 5-6 days ago with stomach virus . Fever to 101-102. Profuse diarrhea. FEver broke 2 days ago. Still having diarrhea. Eight watery BMs yesterday. Had been up to 20 per day 3&4 days ago. Fever again last night. Tolerated regular breakfast this AM. Weight is down six pounds. Pt. Was positive for CoVID 3 weeks ago. Before Christmas was on augmentin. No vomiting.   Depression screen [facility] 2/9 08/25/2020 05/07/2020 02/05/2020 11/06/2019 08/08/2019  Decreased Interest 0 0 0 0 0  Down, Depressed, Hopeless 0 0 0 0 0  PHQ - 2 Score 0 0 0 0 0     Relevant past medical, surgical, family and social history reviewed and updated as indicated.  Interim medical history since our last visit reviewed. Allergies and medications reviewed and updated.  ROS:  Review of Systems  Constitutional: Positive for activity change, appetite change, diaphoresis, fatigue and fever. Negative for chills and unexpected weight change.  HENT: Negative.   Respiratory: Negative for cough, chest tightness and shortness of breath.   Cardiovascular: Negative for chest pain.  Gastrointestinal: Positive for abdominal distention, abdominal pain, diarrhea and nausea. Negative for blood in stool, constipation, rectal pain and vomiting.  Genitourinary: Negative for dysuria, flank pain and hematuria.  Musculoskeletal: Negative for arthralgias and joint swelling.  Skin: Negative for rash.  Neurological: Negative for syncope and headaches.     Social History   Tobacco Use  Smoking Status Never Smoker  Smokeless Tobacco Never Used       Objective:     Wt Readings from Last 3 Encounters:  08/25/20 264 lb (119.7 kg)  06/01/20 264 lb (119.7 kg)  05/07/20 264 lb (119.7 kg)     Exam deferred. Pt. Harboring due to COVID 19. Phone visit performed.   Assessment & Plan:   1.  Diarrhea, unspecified type     Meds ordered this encounter  Medications  . diphenoxylate-atropine (LOMOTIL) 2.5-0.025 MG tablet    Sig: Take 2 tablets by mouth 4 (four) times daily as needed for diarrhea or loose stools.    Dispense:  30 tablet    Refill:  0    Orders Placed This Encounter  Procedures  . Stool culture  . Clostridium difficile EIA  . Cdiff NAA+O+P+Stool Culture  . Giardia/Cryptosporidium EIA  . D-dimer, quantitative (not at [facility])      Diagnoses and all orders for this visit:  Diarrhea, unspecified type -     D-dimer, quantitative (not at [facility]) -     Stool culture -     Clostridium difficile EIA -     Cdiff NAA+O+P+Stool Culture -     Giardia/Cryptosporidium EIA  Other orders -     diphenoxylate-atropine (LOMOTIL) 2.5-0.025 MG tablet; Take 2 tablets by mouth 4 (four) times daily as needed for diarrhea or loose stools.    Virtual Visit via telephone Note  I discussed the limitations, risks, security and privacy concerns of performing an evaluation and management service by telephone and the availability of in person appointments. The patient was identified with two identifiers. Pt.expressed understanding and agreed to proceed. Pt. Is at home. Dr. Livia Snellen is in his office.  Follow Up Instructions:   I discussed the assessment and treatment plan with the patient. The patient was provided an opportunity to ask questions  and all were answered. The patient agreed with the plan and demonstrated an understanding of the instructions.   The patient was advised to call back or seek an in-person evaluation if the symptoms worsen or if the condition fails to improve as anticipated.   Total minutes including chart review and phone contact time: 25   Follow up plan: Return if symptoms worsen or fail to improve.  Claretta Fraise, MD [place]

## 2020-09-02 ENCOUNTER — Other Ambulatory Visit: Payer: Medicare Other

## 2020-09-02 ENCOUNTER — Other Ambulatory Visit: Payer: Self-pay

## 2020-09-02 DIAGNOSIS — R197 Diarrhea, unspecified: Secondary | ICD-10-CM | POA: Diagnosis not present

## 2020-09-03 LAB — D-DIMER, QUANTITATIVE: D-DIMER: 0.41 mg/L FEU (ref 0.00–0.49)

## 2020-09-03 LAB — GIARDIA/CRYPTOSPORIDIUM EIA
Cryptosporidium EIA: NEGATIVE
Giardia Ag, Stl: NEGATIVE

## 2020-09-04 ENCOUNTER — Other Ambulatory Visit: Payer: Self-pay | Admitting: Family Medicine

## 2020-09-04 ENCOUNTER — Encounter: Payer: Self-pay | Admitting: Family Medicine

## 2020-09-04 MED ORDER — AZITHROMYCIN 500 MG PO TABS: 500.0000 mg | ORAL_TABLET | Freq: Every day | ORAL | 0 refills | Status: AC

## 2020-09-06 LAB — CDIFF NAA+O+P+STOOL CULTURE
E coli, Shiga toxin Assay: NEGATIVE
Toxigenic C. Difficile by PCR: NEGATIVE

## 2020-09-15 DIAGNOSIS — G4733 Obstructive sleep apnea (adult) (pediatric): Secondary | ICD-10-CM | POA: Diagnosis not present

## 2020-09-15 DIAGNOSIS — R070 Pain in throat: Secondary | ICD-10-CM | POA: Diagnosis not present

## 2020-09-15 DIAGNOSIS — K219 Gastro-esophageal reflux disease without esophagitis: Secondary | ICD-10-CM | POA: Diagnosis not present

## 2020-09-15 DIAGNOSIS — H9313 Tinnitus, bilateral: Secondary | ICD-10-CM | POA: Diagnosis not present

## 2020-09-15 DIAGNOSIS — H903 Sensorineural hearing loss, bilateral: Secondary | ICD-10-CM | POA: Diagnosis not present

## 2020-10-18 ENCOUNTER — Encounter: Payer: Self-pay | Admitting: Family Medicine

## 2020-10-18 ENCOUNTER — Encounter: Payer: Self-pay | Admitting: Family

## 2020-10-18 ENCOUNTER — Ambulatory Visit (INDEPENDENT_AMBULATORY_CARE_PROVIDER_SITE_OTHER): Payer: Medicare Other | Admitting: Family

## 2020-10-18 DIAGNOSIS — J019 Acute sinusitis, unspecified: Secondary | ICD-10-CM | POA: Diagnosis not present

## 2020-10-18 MED ORDER — AMOXICILLIN-POT CLAVULANATE 875-125 MG PO TABS
1.0000 | ORAL_TABLET | Freq: Two times a day (BID) | ORAL | 0 refills | Status: DC
Start: 2020-10-18 — End: 2020-11-01

## 2020-10-18 NOTE — Progress Notes (Signed)
   Virtual Visit via telephone Note Due to COVID-19 pandemic this visit was conducted virtually. This visit type was conducted due to national recommendations for restrictions regarding the COVID-19 Pandemic (e.g. social distancing, sheltering in place) in an effort to limit this patient's exposure and mitigate transmission in our community. All issues noted in this document were discussed and addressed.  A physical exam was not performed with this format.  I connected with Steve Williams on 10/18/20 at 2:17 pm  by telephone and verified that I am speaking with the correct person using two identifiers. Steve Williams is currently located at home and his wife  is currently with him  during visit. The provider, Evelina Dun, FNP is located in their office at time of visit.  I discussed the limitations, risks, security and privacy concerns of performing an evaluation and management service by telephone and the availability of in person appointments. I also discussed with the patient that there may be a patient responsible charge related to this service. The patient expressed understanding and agreed to proceed.   History and Present Illness:  Sinusitis This is a new problem. The current episode started in the past 7 days. The problem has been gradually worsening since onset. There has been no fever. Associated symptoms include congestion, coughing, headaches, a hoarse voice, sinus pressure, sneezing and a sore throat. Pertinent negatives include no chills, ear pain or shortness of breath. Past treatments include oral decongestants (flonase, singulair). The treatment provided mild relief.      Review of Systems  Constitutional: Negative for chills.  HENT: Positive for congestion, hoarse voice, sinus pressure, sneezing and sore throat. Negative for ear pain.   Respiratory: Positive for cough. Negative for shortness of breath.   Neurological: Positive for headaches.  All other systems reviewed and are  negative.    Observations/Objective: No SOB or distress noted, hoarse voice.   Assessment and Plan: 1. Acute sinusitis, recurrence not specified, unspecified location - Take meds as prescribed - Use a cool mist humidifier  -Use saline nose sprays frequently -Force fluids -For any cough or congestion  Use plain Mucinex- regular strength or max strength is fine -For fever or aces or pains- take tylenol or ibuprofen. -Throat lozenges if help -RTO if symptoms worsen or do not improve  - amoxicillin-clavulanate (AUGMENTIN) 875-125 MG tablet; Take 1 tablet by mouth 2 (two) times daily.  Dispense: 14 tablet; Refill: 0     I discussed the assessment and treatment plan with the patient. The patient was provided an opportunity to ask questions and all were answered. The patient agreed with the plan and demonstrated an understanding of the instructions.   The patient was advised to call back or seek an in-person evaluation if the symptoms worsen or if the condition fails to improve as anticipated.  The above assessment and management plan was discussed with the patient. The patient verbalized understanding of and has agreed to the management plan. Patient is aware to call the clinic if symptoms persist or worsen. Patient is aware when to return to the clinic for a follow-up visit. Patient educated on when it is appropriate to go to the emergency department.   Time call ended:  2:28 pm   I provided 11 minutes of non-face-to-face time during this encounter.    Evelina Dun, FNP

## 2020-10-18 NOTE — Patient Instructions (Signed)
Sinusitis, Adult Sinusitis is inflammation of your sinuses. Sinuses are hollow spaces in the bones around your face. Your sinuses are located:  Around your eyes.  In the middle of your forehead.  Behind your nose.  In your cheekbones. Mucus normally drains out of your sinuses. When your nasal tissues become inflamed or swollen, mucus can become trapped or blocked. This allows bacteria, viruses, and fungi to grow, which leads to infection. Most infections of the sinuses are caused by a virus. Sinusitis can develop quickly. It can last for up to 4 weeks (acute) or for more than 12 weeks (chronic). Sinusitis often develops after a cold. What are the causes? This condition is caused by anything that creates swelling in the sinuses or stops mucus from draining. This includes:  Allergies.  Asthma.  Infection from bacteria or viruses.  Deformities or blockages in your nose or sinuses.  Abnormal growths in the nose (nasal polyps).  Pollutants, such as chemicals or irritants in the air.  Infection from fungi (rare). What increases the risk? You are more likely to develop this condition if you:  Have a weak body defense system (immune system).  Do a lot of swimming or diving.  Overuse nasal sprays.  Smoke. What are the signs or symptoms? The main symptoms of this condition are pain and a feeling of pressure around the affected sinuses. Other symptoms include:  Stuffy nose or congestion.  Thick drainage from your nose.  Swelling and warmth over the affected sinuses.  Headache.  Upper toothache.  A cough that may get worse at night.  Extra mucus that collects in the throat or the back of the nose (postnasal drip).  Decreased sense of smell and taste.  Fatigue.  A fever.  Sore throat.  Bad breath. How is this diagnosed? This condition is diagnosed based on:  Your symptoms.  Your medical history.  A physical exam.  Tests to find out if your condition is  acute or chronic. This may include: ? Checking your nose for nasal polyps. ? Viewing your sinuses using a device that has a light (endoscope). ? Testing for allergies or bacteria. ? Imaging tests, such as an MRI or CT scan. In rare cases, a bone biopsy may be done to rule out more serious types of fungal sinus disease. How is this treated? Treatment for sinusitis depends on the cause and whether your condition is chronic or acute.  If caused by a virus, your symptoms should go away on their own within 10 days. You may be given medicines to relieve symptoms. They include: ? Medicines that shrink swollen nasal passages (topical intranasal decongestants). ? Medicines that treat allergies (antihistamines). ? A spray that eases inflammation of the nostrils (topical intranasal corticosteroids). ? Rinses that help get rid of thick mucus in your nose (nasal saline washes).  If caused by bacteria, your health care provider may recommend waiting to see if your symptoms improve. Most bacterial infections will get better without antibiotic medicine. You may be given antibiotics if you have: ? A severe infection. ? A weak immune system.  If caused by narrow nasal passages or nasal polyps, you may need to have surgery. Follow these instructions at home: Medicines  Take, use, or apply over-the-counter and prescription medicines only as told by your health care provider. These may include nasal sprays.  If you were prescribed an antibiotic medicine, take it as told by your health care provider. Do not stop taking the antibiotic even if you start   to feel better. Hydrate and humidify  Drink enough fluid to keep your urine pale yellow. Staying hydrated will help to thin your mucus.  Use a cool mist humidifier to keep the humidity level in your home above 50%.  Inhale steam for 10-15 minutes, 3-4 times a day, or as told by your health care provider. You can do this in the bathroom while a hot shower is  running.  Limit your exposure to cool or dry air.   Rest  Rest as much as possible.  Sleep with your head raised (elevated).  Make sure you get enough sleep each night. General instructions  Apply a warm, moist washcloth to your face 3-4 times a day or as told by your health care provider. This will help with discomfort.  Wash your hands often with soap and water to reduce your exposure to germs. If soap and water are not available, use hand sanitizer.  Do not smoke. Avoid being around people who are smoking (secondhand smoke).  Keep all follow-up visits as told by your health care provider. This is important.   Contact a health care provider if:  You have a fever.  Your symptoms get worse.  Your symptoms do not improve within 10 days. Get help right away if:  You have a severe headache.  You have persistent vomiting.  You have severe pain or swelling around your face or eyes.  You have vision problems.  You develop confusion.  Your neck is stiff.  You have trouble breathing. Summary  Sinusitis is soreness and inflammation of your sinuses. Sinuses are hollow spaces in the bones around your face.  This condition is caused by nasal tissues that become inflamed or swollen. The swelling traps or blocks the flow of mucus. This allows bacteria, viruses, and fungi to grow, which leads to infection.  If you were prescribed an antibiotic medicine, take it as told by your health care provider. Do not stop taking the antibiotic even if you start to feel better.  Keep all follow-up visits as told by your health care provider. This is important. This information is not intended to replace advice given to you by your health care provider. Make sure you discuss any questions you have with your health care provider. Document Revised: 12/17/2017 Document Reviewed: 12/17/2017 Elsevier Patient Education  2021 Elsevier Inc.  

## 2020-10-20 ENCOUNTER — Ambulatory Visit (INDEPENDENT_AMBULATORY_CARE_PROVIDER_SITE_OTHER): Payer: Medicare Other | Admitting: Nurse Practitioner

## 2020-10-20 ENCOUNTER — Encounter: Payer: Self-pay | Admitting: Nurse Practitioner

## 2020-10-20 VITALS — BP 119/68 | HR 68 | Temp 98.1°F | Ht 67.0 in | Wt 250.0 lb

## 2020-10-20 DIAGNOSIS — J069 Acute upper respiratory infection, unspecified: Secondary | ICD-10-CM

## 2020-10-20 MED ORDER — HYDROCODONE-HOMATROPINE 5-1.5 MG/5ML PO SYRP: 5.0000 mL | ORAL_SOLUTION | Freq: Four times a day (QID) | ORAL | 0 refills | Status: DC | PRN

## 2020-10-20 MED ORDER — METHYLPREDNISOLONE ACETATE 40 MG/ML IJ SUSP
40.0000 mg | Freq: Once | INTRAMUSCULAR | Status: AC
  Administered 2020-10-20: 40 mg via INTRAMUSCULAR

## 2020-10-20 NOTE — Progress Notes (Signed)
Subjective:    Patient ID: Steve Williams, male    DOB: 31-Mar-1956, 65 y.o.   MRN: [id number]   Chief Complaint: Cough   HPI Patient comes in today c/o of cough and congestion. Has been running a low grade fever for 2 days. He had telephone visit on Monday and was given augmentin and he is feeling a little better. He is mainly concerned about the cough.   Review of Systems  Constitutional: Positive for fever (low grade 99.2).  HENT: Positive for congestion, postnasal drip, rhinorrhea, sinus pressure and sinus pain. Negative for ear pain, sore throat and trouble swallowing.   Respiratory: Positive for cough. Negative for shortness of breath.   Musculoskeletal: Positive for arthralgias (slight). Negative for myalgias.  Neurological: Negative for dizziness and headaches.  All other systems reviewed and are negative.      Objective:   Physical Exam Vitals and nursing note reviewed.  Constitutional:      Appearance: Normal appearance.  HENT:     Right Ear: Tympanic membrane normal.     Left Ear: Tympanic membrane normal.     Nose: Congestion and rhinorrhea present.     Mouth/Throat:     Pharynx: No oropharyngeal exudate or posterior oropharyngeal erythema.  Cardiovascular:     Rate and Rhythm: Normal rate and regular rhythm.     Heart sounds: Normal heart sounds.  Pulmonary:     Effort: Pulmonary effort is normal.     Breath sounds: Normal breath sounds. No wheezing, rhonchi or rales.     Comments: Deep dry cough Skin:    General: Skin is warm.  Neurological:     General: No focal deficit present.     Mental Status: He is alert and oriented to person, place, and time.    BP 119/68   Pulse 68   Temp 98.1 F (36.7 C) (Skin)   Ht 5\' 7"  (1.702 m)   Wt 250 lb (113.4 kg)   BMI 39.16 kg/m        Assessment & Plan:  [name] in today with chief complaint of Cough   1. URI with cough and congestion 1. Take meds as prescribed 2. Use a cool mist humidifier  especially during the winter months and when heat has been humid. 3. Use saline nose sprays frequently 4. Saline irrigations of the nose can be very helpful if done frequently.  * 4X daily for 1 week*  * Use of a nettie pot can be helpful with this. Follow directions with this* 5. Drink plenty of fluids 6. Keep thermostat turn down low 7.For any cough or congestion  Use plain Mucinex- regular strength or max strength is fine   * Children- consult with Pharmacist for dosing 8. For fever or aces or pains- take tylenol or ibuprofen appropriate for age and weight.  * for fevers greater than 101 orally you may alternate ibuprofen and tylenol every  3 hours.     - HYDROcodone-homatropine (HYCODAN) 5-1.5 MG/5ML syrup; Take 5 mLs by mouth every 6 (six) hours as needed for cough.  Dispense: 120 mL; Refill: 0 - methylPREDNISolone acetate (DEPO-MEDROL) injection 40 mg    The above assessment and management plan was discussed with the patient. The patient verbalized understanding of and has agreed to the management plan. Patient is aware to call the clinic if symptoms persist or worsen. Patient is aware when to return to the clinic for a follow-up visit. Patient educated on when it is appropriate  to go to the emergency department.   Mary-Margaret Hassell Done, FNP

## 2020-10-20 NOTE — Patient Instructions (Signed)

## 2020-10-28 ENCOUNTER — Other Ambulatory Visit: Payer: Self-pay | Admitting: Family Medicine

## 2020-10-28 NOTE — Telephone Encounter (Signed)
Last chronic follow up with you on 05/07/2020 Last office visit with any provider 10/20/20 for illness Last refill 04/06/20, #30, 5 refills

## 2020-11-01 ENCOUNTER — Ambulatory Visit (INDEPENDENT_AMBULATORY_CARE_PROVIDER_SITE_OTHER): Payer: Medicare Other | Admitting: Family Medicine

## 2020-11-01 ENCOUNTER — Other Ambulatory Visit: Payer: Self-pay

## 2020-11-01 ENCOUNTER — Encounter: Payer: Self-pay | Admitting: Family Medicine

## 2020-11-01 VITALS — BP 139/79 | HR 89 | Ht 67.0 in | Wt 255.0 lb

## 2020-11-01 DIAGNOSIS — J302 Other seasonal allergic rhinitis: Secondary | ICD-10-CM | POA: Diagnosis not present

## 2020-11-01 DIAGNOSIS — E1159 Type 2 diabetes mellitus with other circulatory complications: Secondary | ICD-10-CM

## 2020-11-01 DIAGNOSIS — E785 Hyperlipidemia, unspecified: Secondary | ICD-10-CM | POA: Diagnosis not present

## 2020-11-01 DIAGNOSIS — I152 Hypertension secondary to endocrine disorders: Secondary | ICD-10-CM | POA: Diagnosis not present

## 2020-11-01 DIAGNOSIS — E1169 Type 2 diabetes mellitus with other specified complication: Secondary | ICD-10-CM | POA: Diagnosis not present

## 2020-11-01 LAB — BAYER DCA HB A1C WAIVED: HB A1C (BAYER DCA - WAIVED): 5.9 % (ref <7.0)

## 2020-11-01 LAB — LIPID PANEL

## 2020-11-01 LAB — CBC WITH DIFFERENTIAL/PLATELET

## 2020-11-01 MED ORDER — MONTELUKAST SODIUM 10 MG PO TABS: 1.0000 | ORAL_TABLET | Freq: Every day | ORAL | 3 refills | Status: DC

## 2020-11-01 NOTE — Progress Notes (Signed)
BP 139/79   Pulse 89   Ht 5' 7"  (1.702 m)   Wt 255 lb (115.7 kg)   SpO2 94%   BMI 39.94 kg/m    Subjective:   Patient ID: Steve Williams, male    DOB: 1956-03-26, 65 y.o.   MRN: [id number]  HPI: Steve Williams is a 65 y.o. male presenting on 11/01/2020 for Medical Management of Chronic Issues, Diabetes, Hyperlipidemia, and Hypertension   HPI Type 2 diabetes mellitus Patient comes in today for recheck of his diabetes. Patient has been currently taking no medication currently, has been diet controlled. Patient is currently on an ACE inhibitor/ARB. Patient has not seen an ophthalmologist this year. Patient denies any issues with their feet. The symptom started onset as an adult hypertension and hyperlipidemia ARE RELATED TO DM   Hypertension Patient is currently on losartan hydrochlorothiazide, and their blood pressure today is 139/79. Patient denies any lightheadedness or dizziness. Patient denies headaches, blurred vision, chest pains, shortness of breath, or weakness. Denies any side effects from medication and is content with current medication.   Hyperlipidemia Patient is coming in for recheck of his hyperlipidemia. The patient is currently taking simvastatin. They deny any issues with myalgias or history of liver damage from it. They deny any focal numbness or weakness or chest pain.   Patient recently had some colds and earlier in the year had COVID and had a steroid pack so is where the sugars will be up.  Relevant past medical, surgical, family and social history reviewed and updated as indicated. Interim medical history since our last visit reviewed. Allergies and medications reviewed and updated.  Review of Systems  Constitutional: Negative for chills and fever.  Eyes: Negative for visual disturbance.  Respiratory: Negative for shortness of breath and wheezing.   Cardiovascular: Negative for chest pain and leg swelling.  Musculoskeletal: Negative for back pain and gait  problem.  Skin: Negative for rash.  Neurological: Negative for dizziness, weakness and numbness.  All other systems reviewed and are negative.   Per HPI unless specifically indicated above   Allergies as of 11/01/2020      Reactions   Lisinopril    Erythromycin Rash      Medication List       Accurate as of November 01, 2020  8:31 AM. If you have any questions, ask your nurse or doctor.        STOP taking these medications   amoxicillin-clavulanate 875-125 MG tablet Commonly known as: AUGMENTIN Stopped by: Fransisca Kaufmann Dahlia Nifong, MD     TAKE these medications   aspirin 81 MG tablet Take 81 mg by mouth daily.   B-D 3CC LUER-LOK SYR 22GX1-1/2 22G X 1-1/2" 3 ML Misc Generic drug: SYRINGE-NEEDLE (DISP) 3 ML USE TO GIVE TESTOSTERONE   B-D 3CC LUER-LOK SYR 22GX1" 22G X 1" 3 ML Misc Generic drug: SYRINGE-NEEDLE (DISP) 3 ML USE TO GIVE TESTOSTERONE   BD Disp Needles 18G X 1-1/2" Misc Generic drug: NEEDLE (DISP) 18 G USE TO DRAW UP TESTOSTERONE   diphenoxylate-atropine 2.5-0.025 MG tablet Commonly known as: Lomotil Take 2 tablets by mouth 4 (four) times daily as needed for diarrhea or loose stools.   fluticasone 50 MCG/ACT nasal spray Commonly known as: FLONASE Place into both nostrils daily.   HYDROcodone-homatropine 5-1.5 MG/5ML syrup Commonly known as: HYCODAN Take 5 mLs by mouth every 6 (six) hours as needed for cough.   losartan-hydrochlorothiazide 100-25 MG tablet Commonly known as: HYZAAR Take 1 tablet by  mouth daily.   meloxicam 15 MG tablet Commonly known as: MOBIC TAKE 1 TABLET BY MOUTH DAILY   montelukast 10 MG tablet Commonly known as: SINGULAIR Take 1 tablet (10 mg total) by mouth at bedtime.   sertraline 100 MG tablet Commonly known as: ZOLOFT TAKE 1 TABLET BY MOUTH DAILY   sildenafil 20 MG tablet Commonly known as: REVATIO TAKE 1-5 TABLETS BY MOUTH ONCE DAILY AS NEEDED FOR ERECTILE DYSFUNCTION   simvastatin 40 MG tablet Commonly known as:  ZOCOR Take 1 tablet (40 mg total) by mouth daily.   testosterone cypionate 200 MG/ML injection Commonly known as: DEPOTESTOSTERONE CYPIONATE INJECT 0.5ML INTRAMUSCULARLY EVERY SEVEN DAYS        Objective:   BP 139/79   Pulse 89   Ht 5' 7"  (1.702 m)   Wt 255 lb (115.7 kg)   SpO2 94%   BMI 39.94 kg/m   Wt Readings from Last 3 Encounters:  11/01/20 255 lb (115.7 kg)  10/20/20 250 lb (113.4 kg)  08/25/20 264 lb (119.7 kg)    Physical Exam Vitals and nursing note reviewed.  Constitutional:      General: He is not in acute distress.    Appearance: He is well-developed. He is not diaphoretic.  Eyes:     General: No scleral icterus.    Conjunctiva/sclera: Conjunctivae normal.  Neck:     Thyroid: No thyromegaly.  Cardiovascular:     Rate and Rhythm: Normal rate and regular rhythm.     Heart sounds: Normal heart sounds. No murmur heard.   Pulmonary:     Effort: Pulmonary effort is normal. No respiratory distress.     Breath sounds: Normal breath sounds. No wheezing.  Musculoskeletal:        General: Normal range of motion.     Cervical back: Neck supple.  Lymphadenopathy:     Cervical: No cervical adenopathy.  Skin:    General: Skin is warm and dry.     Findings: No rash.  Neurological:     Mental Status: He is alert and oriented to person, place, and time.     Coordination: Coordination normal.  Psychiatric:        Behavior: Behavior normal.       Assessment & Plan:   Problem List Items Addressed This Visit      Cardiovascular and Mediastinum   Hypertension associated with diabetes ([place])   Relevant Orders   CBC with Differential/Platelet   CMP14+EGFR   Lipid panel   Bayer DCA Hb A1c Waived     Respiratory   Allergic rhinitis   Relevant Medications   montelukast (SINGULAIR) 10 MG tablet     Endocrine   DM2 (diabetes mellitus, type 2) ([facility]) - Primary   Relevant Orders   CBC with Differential/Platelet   CMP14+EGFR   Lipid panel   Bayer DCA Hb  A1c Waived   Hyperlipidemia associated with type 2 diabetes mellitus ([place])   Relevant Orders   CBC with Differential/Platelet   CMP14+EGFR   Lipid panel   Bayer DCA Hb A1c Waived      Patient seems to be doing well, A1c pending, had a send out because her machine was not working today.  Blood pressure looks good and his weight is down some.  He is working on being more active. Follow up plan: Return in about 6 months (around 05/03/2021), or if symptoms worsen or fail to improve, for Diabetes and hypertension and cholesterol.  Counseling provided for all of the vaccine  components Orders Placed This Encounter  Procedures  . CBC with Differential/Platelet  . CMP14+EGFR  . Lipid panel  . Bayer [facility] Hb A1c [place], MD [facility] 11/01/2020, 8:31 AM

## 2020-11-02 LAB — CBC WITH DIFFERENTIAL/PLATELET
Basophils Absolute: 0.1 10*3/uL (ref 0.0–0.2)
Basos: 1 %
EOS (ABSOLUTE): 0.1 10*3/uL (ref 0.0–0.4)
Eos: 1 %
Hematocrit: 49.9 % (ref 37.5–51.0)
Hemoglobin: 17 g/dL (ref 13.0–17.7)
Immature Grans (Abs): 0.1 10*3/uL (ref 0.0–0.1)
Immature Granulocytes: 1 %
Lymphocytes Absolute: 1.9 10*3/uL (ref 0.7–3.1)
Lymphs: 18 %
MCH: 30.7 pg (ref 26.6–33.0)
MCHC: 34.1 g/dL (ref 31.5–35.7)
MCV: 90 fL (ref 79–97)
Monocytes Absolute: 0.7 10*3/uL (ref 0.1–0.9)
Monocytes: 6 %
Neutrophils Absolute: 7.6 10*3/uL — ABNORMAL HIGH (ref 1.4–7.0)
Neutrophils: 73 %
Platelets: 127 10*3/uL — ABNORMAL LOW (ref 150–450)
RBC: 5.54 x10E6/uL (ref 4.14–5.80)
RDW: 13.7 % (ref 11.6–15.4)
WBC: 10.2 10*3/uL (ref 3.4–10.8)

## 2020-11-02 LAB — CMP14+EGFR
ALT: 20 IU/L (ref 0–44)
AST: 15 IU/L (ref 0–40)
Albumin/Globulin Ratio: 2.3 — ABNORMAL HIGH (ref 1.2–2.2)
Albumin: 4.3 g/dL (ref 3.8–4.8)
Alkaline Phosphatase: 73 IU/L (ref 44–121)
BUN/Creatinine Ratio: 22 (ref 10–24)
BUN: 18 mg/dL (ref 8–27)
Bilirubin Total: 0.7 mg/dL (ref 0.0–1.2)
CO2: 20 mmol/L (ref 20–29)
Calcium: 9 mg/dL (ref 8.6–10.2)
Chloride: 101 mmol/L (ref 96–106)
Creatinine, Ser: 0.83 mg/dL (ref 0.76–1.27)
Globulin, Total: 1.9 g/dL (ref 1.5–4.5)
Glucose: 126 mg/dL — ABNORMAL HIGH (ref 65–99)
Potassium: 4 mmol/L (ref 3.5–5.2)
Sodium: 139 mmol/L (ref 134–144)
Total Protein: 6.2 g/dL (ref 6.0–8.5)
eGFR: 97 mL/min/{1.73_m2} (ref >59)

## 2020-11-02 LAB — LIPID PANEL
Chol/HDL Ratio: 3.4 ratio (ref 0.0–5.0)
Cholesterol, Total: 124 mg/dL (ref 100–199)
HDL: 36 mg/dL — ABNORMAL LOW (ref >39)
LDL Chol Calc (NIH): 61 mg/dL (ref 0–99)
Triglycerides: 156 mg/dL — ABNORMAL HIGH (ref 0–149)
VLDL Cholesterol Cal: 27 mg/dL (ref 5–40)

## 2020-11-05 ENCOUNTER — Ambulatory Visit: Payer: Medicare Other | Admitting: Family Medicine

## 2020-11-16 DIAGNOSIS — K219 Gastro-esophageal reflux disease without esophagitis: Secondary | ICD-10-CM | POA: Diagnosis not present

## 2020-11-16 DIAGNOSIS — R070 Pain in throat: Secondary | ICD-10-CM | POA: Diagnosis not present

## 2020-12-09 ENCOUNTER — Other Ambulatory Visit: Payer: Self-pay | Admitting: Family Medicine

## 2020-12-09 DIAGNOSIS — E291 Testicular hypofunction: Secondary | ICD-10-CM

## 2021-01-21 DIAGNOSIS — R070 Pain in throat: Secondary | ICD-10-CM | POA: Diagnosis not present

## 2021-01-21 DIAGNOSIS — K219 Gastro-esophageal reflux disease without esophagitis: Secondary | ICD-10-CM | POA: Diagnosis not present

## 2021-02-17 ENCOUNTER — Telehealth: Payer: Self-pay | Admitting: Family Medicine

## 2021-02-17 NOTE — Telephone Encounter (Cosign Needed)
Spoke with Merry Proud last week about his colorectal cancer screening. He had a colonoscopy 6 years ago with Dr. Britta Mccreedy at [facility]. He was advised to repeat in 5 years. He would be willing to do it with Dr. Britta Mccreedy again, but he is no longer at the hospital. There are no other doctors in [facility] that he is interested in. He said he might be open to another GI doctor in [place], but would want to discuss this at his next appointment.

## 2021-02-28 ENCOUNTER — Other Ambulatory Visit: Payer: Self-pay | Admitting: Family Medicine

## 2021-03-19 DIAGNOSIS — J208 Acute bronchitis due to other specified organisms: Secondary | ICD-10-CM | POA: Diagnosis not present

## 2021-03-19 DIAGNOSIS — U071 COVID-19: Secondary | ICD-10-CM | POA: Diagnosis not present

## 2021-04-25 ENCOUNTER — Encounter: Payer: Self-pay | Admitting: Nurse Practitioner

## 2021-04-25 ENCOUNTER — Ambulatory Visit (INDEPENDENT_AMBULATORY_CARE_PROVIDER_SITE_OTHER): Payer: Medicare Other | Admitting: Nurse Practitioner

## 2021-04-25 ENCOUNTER — Other Ambulatory Visit: Payer: Self-pay

## 2021-04-25 VITALS — BP 134/73 | HR 68 | Temp 98.0°F | Ht 67.0 in | Wt 249.0 lb

## 2021-04-25 DIAGNOSIS — M25561 Pain in right knee: Secondary | ICD-10-CM

## 2021-04-25 DIAGNOSIS — M25562 Pain in left knee: Secondary | ICD-10-CM

## 2021-04-25 DIAGNOSIS — G4733 Obstructive sleep apnea (adult) (pediatric): Secondary | ICD-10-CM | POA: Diagnosis not present

## 2021-04-25 MED ORDER — METHYLPREDNISOLONE ACETATE 80 MG/ML IJ SUSP
40.0000 mg | Freq: Once | INTRAMUSCULAR | Status: AC
  Administered 2021-04-25: 40 mg via INTRAMUSCULAR

## 2021-04-25 MED ORDER — LIDOCAINE HCL 2 % IJ SOLN
1.0000 mL | Freq: Once | INTRAMUSCULAR | Status: AC
  Administered 2021-04-25: 20 mg

## 2021-04-25 NOTE — Patient Instructions (Signed)
Joint Steroid Injection A joint steroid injection is a procedure to relieve swelling and pain in a joint. Steroids are medicines that reduce inflammation. In this procedure, your health care provider uses a syringe and a needle to inject a steroid medicine into a painful and inflamed joint. A pain-relieving medicine (anesthetic) may be injected along with the steroid. In some cases, your health care provider may use an imaging technique such as ultrasound or fluoroscopy toguide the injection. Joints that are often treated with steroid injections include the knee, shoulder, hip, and spine. These injections may also be used in the elbow, ankle, and joints of the hands or feet. You may have joint steroid injections as part of your treatment for inflammation caused by: Gout. Rheumatoid arthritis. Advanced wear-and-tear arthritis (osteoarthritis). Tendinitis. Bursitis. Joint steroid injections may be repeated, but having them too often can damage a joint or the skin over the joint. You should not have joint steroidinjections less than 6 weeks apart or more than four times a year. Tell a health care provider about: Any allergies you have. All medicines you are taking, including vitamins, herbs, eye drops, creams, and over-the-counter medicines. Any problems you or family members have had with anesthetic medicines. Any blood disorders you have. Any surgeries you have had. Any medical conditions you have. Whether you are pregnant or may be pregnant. What are the risks? Generally, this is a safe treatment. However, problems may occur, including: Infection. Bleeding. Allergic reactions to medicines. Damage to the joint or tissues around the joint. Thinning of skin or loss of skin color over the joint. Temporary flushing of the face or chest. Temporary increase in pain. Temporary increase in blood sugar. Failure to relieve inflammation or pain. What happens before the treatment? Medicines Ask  your health care provider about: Changing or stopping your regular medicines. This is especially important if you are taking diabetes medicines or blood thinners. Taking medicines such as aspirin and ibuprofen. These medicines can thin your blood. Do not take these medicines unless your health care provider tells you to take them. Taking over-the-counter medicines, vitamins, herbs, and supplements. General instructions You may have imaging tests of your joint. Ask your health care provider if you can drive yourself home after the procedure. What happens during the treatment?  Your health care provider will position you for the injection and locate the injection site over your joint. The skin over the joint will be cleaned with a germ-killing soap. Your health care provider may: Spray a numbing solution (topical anesthetic) over the injection site. Inject a local anesthetic under the skin above your joint. The needle will be placed through your skin into your joint. Your health care provider may use imaging to guide the needle to the right spot for the injection. If imaging is used, a special contrast dye may be injected to confirm that the needle is in the correct location. The steroid medicine will be injected into your joint. Anesthetic may be injected along with the steroid. This may be a medicine that relieves pain for a short time (short-acting anesthetic) or for a longer time (long-acting anesthetic). The needle will be removed, and an adhesive bandage (dressing) will be placed over the injection site. The procedure may vary among health care providers and hospitals. What can I expect after the treatment? You will be able to go home after the treatment. It is normal to feel slight flushing for a few days after the injection. After the treatment, it is common to   have an increase in joint pain after the anesthetic has worn off. This may happen about an hour after a short-acting anesthetic  or about 8 hours after a longer-acting anesthetic. You should begin to feel relief from joint pain and swelling after 24 to 48 hours. Contact your health care provider if you do not begin to feel relief after 2 days. Follow these instructions at home: Injection site care Leave the adhesive dressing over your injection site in place until your health care provider says you can remove it. Check your injection site every day for signs of infection. Check for: More redness, swelling, or pain. Fluid or blood. Warmth. Pus or a bad smell. Activity Return to your normal activities as told by your health care provider. Ask your health care provider what activities are safe for you. You may be asked to limit activities that put stress on the joint for a few days. Do joint exercises as told by your health care provider. Do not take baths, swim, or use a hot tub until your health care provider approves. Ask your health care provider if you may take showers. You may only be allowed to take sponge baths. Managing pain, stiffness, and swelling  If directed, put ice on the joint. To do this: Put ice in a plastic bag. Place a towel between your skin and the bag. Leave the ice on for 20 minutes, 2-3 times a day. Remove the ice if your skin turns bright red. This is very important. If you cannot feel pain, heat, or cold, you have a greater risk of damage to the area. Raise (elevate) your joint above the level of your heart when you are sitting or lying down.  General instructions Take over-the-counter and prescription medicines only as told by your health care provider. Do not use any products that contain nicotine or tobacco, such as cigarettes, e-cigarettes, and chewing tobacco. These can delay joint healing. If you need help quitting, ask your health care provider. If you have diabetes, be aware that your blood sugar may be slightly elevated for several days after the injection. Keep all follow-up visits.  This is important. Contact a health care provider if you have: Chills or a fever. Any signs of infection at your injection site. Increased pain or swelling or no relief after 2 days. Summary A joint steroid injection is a treatment to relieve pain and swelling in a joint. Steroids are medicines that reduce inflammation. Your health care provider may add an anesthetic along with the steroid. You may have joint steroid injections as part of your arthritis treatment. Joint steroid injections may be repeated, but having them too often can damage a joint or the skin over the joint. Contact your health care provider if you have a fever, chills, or signs of infection, or if you get no relief from joint pain or swelling. This information is not intended to replace advice given to you by your health care provider. Make sure you discuss any questions you have with your healthcare provider. Document Revised: 12/26/2019 Document Reviewed: 12/26/2019 Elsevier Patient Education  2022 [employer]  

## 2021-04-25 NOTE — Progress Notes (Signed)
Subjective:    Patient ID: Steve Williams, male    DOB: 1955-11-27, 65 y.o.   MRN: [id number]  Chief Complaint: Knee Pain   HPI Patient comes in  today c/o bil knee pain. He had cortisone shots in his knees over a year ago. He sees Dr Mayer Camel, orthopedic, and needs bil total knee replacements. He has been doing a lot of lifting that requires some squatting and that has made knees better. Has intermittent swelling and pain with walking and standing.     Review of Systems  Constitutional:  Negative for diaphoresis.  Eyes:  Negative for pain.  Respiratory:  Negative for shortness of breath.   Cardiovascular:  Negative for chest pain, palpitations and leg swelling.  Gastrointestinal:  Negative for abdominal pain.  Endocrine: Negative for polydipsia.  Musculoskeletal:  Positive for arthralgias (bil knees).  Skin:  Negative for rash.  Neurological:  Negative for dizziness, weakness and headaches.  Hematological:  Does not bruise/bleed easily.  All other systems reviewed and are negative.     Objective:   Physical Exam Vitals and nursing note reviewed.  Constitutional:      Appearance: Normal appearance.  Cardiovascular:     Rate and Rhythm: Normal rate and regular rhythm.  Pulmonary:     Effort: Pulmonary effort is normal.     Breath sounds: Normal breath sounds.  Skin:    General: Skin is warm.  Neurological:     General: No focal deficit present.     Mental Status: He is alert and oriented to person, place, and time.  Psychiatric:        Mood and Affect: Mood normal.        Behavior: Behavior normal.    Joint Injection/Arthrocentesis  Date/Time: 04/25/2021 10:11 AM Performed by: Chevis Pretty, FNP Authorized by: Hassell Done Mary-Margaret, FNP  Indications: pain  Body area: knee Local anesthesia used: no  Anesthesia: Local anesthesia used: no  Sedation: Patient sedated: no  Preparation: Patient was prepped and draped in the usual sterile fashion. Needle size:  22 G Ultrasound guidance: no Approach: superior Methylprednisolone amount: 40 mg Lidocaine 2% amount: 1 mL Patient tolerance: patient tolerated the procedure well with no immediate complications   Joint Injection/Arthrocentesis  Date/Time: 04/25/2021 10:12 AM Performed by: Chevis Pretty, FNP Authorized by: Hassell Done Mary-Margaret, FNP  Indications: pain  Body area: knee Local anesthesia used: no  Anesthesia: Local anesthesia used: no  Sedation: Patient sedated: no  Preparation: Patient was prepped and draped in the usual sterile fashion. Needle size: 22 G Approach: superior Methylprednisolone amount: 40 mg Lidocaine 2% amount: 1 mL Patient tolerance: patient tolerated the procedure well with no immediate complications         Assessment & Plan:  Steve Williams in today with chief complaint of Knee Pain   1. Acute pain of both knees  Rest Ice  conpression wraps  Elevate when sitting - methylPREDNISolone acetate (DEPO-MEDROL) injection 40 mg - lidocaine (XYLOCAINE) 2 % (with pres) injection 20 mg - methylPREDNISolone acetate (DEPO-MEDROL) injection 40 mg - lidocaine (XYLOCAINE) 2 % (with pres) injection 20 mg    The above assessment and management plan was discussed with the patient. The patient verbalized understanding of and has agreed to the management plan. Patient is aware to call the clinic if symptoms persist or worsen. Patient is aware when to return to the clinic for a follow-up visit. Patient educated on when it is appropriate to go to the emergency department.   Mary-Margaret  Hassell Done, [place]

## 2021-05-04 ENCOUNTER — Other Ambulatory Visit: Payer: Self-pay

## 2021-05-04 ENCOUNTER — Encounter: Payer: Self-pay | Admitting: Family Medicine

## 2021-05-04 ENCOUNTER — Ambulatory Visit (INDEPENDENT_AMBULATORY_CARE_PROVIDER_SITE_OTHER): Payer: Medicare Other | Admitting: Family Medicine

## 2021-05-04 VITALS — BP 134/75 | HR 75 | Ht 67.0 in | Wt 241.0 lb

## 2021-05-04 DIAGNOSIS — E785 Hyperlipidemia, unspecified: Secondary | ICD-10-CM

## 2021-05-04 DIAGNOSIS — E1169 Type 2 diabetes mellitus with other specified complication: Secondary | ICD-10-CM | POA: Diagnosis not present

## 2021-05-04 DIAGNOSIS — E1159 Type 2 diabetes mellitus with other circulatory complications: Secondary | ICD-10-CM | POA: Diagnosis not present

## 2021-05-04 DIAGNOSIS — I152 Hypertension secondary to endocrine disorders: Secondary | ICD-10-CM | POA: Diagnosis not present

## 2021-05-04 LAB — LIPID PANEL
Chol/HDL Ratio: 3.1 ratio (ref 0.0–5.0)
Cholesterol, Total: 117 mg/dL (ref 100–199)
HDL: 38 mg/dL — ABNORMAL LOW
LDL Chol Calc (NIH): 58 mg/dL (ref 0–99)
Triglycerides: 113 mg/dL (ref 0–149)
VLDL Cholesterol Cal: 21 mg/dL (ref 5–40)

## 2021-05-04 LAB — CBC WITH DIFFERENTIAL/PLATELET
Basophils Absolute: 0.1 10*3/uL (ref 0.0–0.2)
Basos: 1 %
EOS (ABSOLUTE): 0.1 10*3/uL (ref 0.0–0.4)
Eos: 1 %
Hematocrit: 49 % (ref 37.5–51.0)
Hemoglobin: 17 g/dL (ref 13.0–17.7)
Immature Grans (Abs): 0.1 10*3/uL (ref 0.0–0.1)
Immature Granulocytes: 1 %
Lymphocytes Absolute: 2 10*3/uL (ref 0.7–3.1)
Lymphs: 29 %
MCH: 30.4 pg (ref 26.6–33.0)
MCHC: 34.7 g/dL (ref 31.5–35.7)
MCV: 88 fL (ref 79–97)
Monocytes Absolute: 0.6 10*3/uL (ref 0.1–0.9)
Monocytes: 8 %
Neutrophils Absolute: 4 10*3/uL (ref 1.4–7.0)
Neutrophils: 60 %
Platelets: 116 10*3/uL — ABNORMAL LOW (ref 150–450)
RBC: 5.6 x10E6/uL (ref 4.14–5.80)
RDW: 12.6 % (ref 11.6–15.4)
WBC: 6.7 10*3/uL (ref 3.4–10.8)

## 2021-05-04 LAB — CMP14+EGFR
ALT: 16 [IU]/L (ref 0–44)
AST: 13 [IU]/L (ref 0–40)
Albumin/Globulin Ratio: 2.3 — ABNORMAL HIGH (ref 1.2–2.2)
Albumin: 4.2 g/dL (ref 3.8–4.8)
Alkaline Phosphatase: 75 [IU]/L (ref 44–121)
BUN/Creatinine Ratio: 22 (ref 10–24)
BUN: 17 mg/dL (ref 8–27)
Bilirubin Total: 0.7 mg/dL (ref 0.0–1.2)
CO2: 21 mmol/L (ref 20–29)
Calcium: 9 mg/dL (ref 8.6–10.2)
Chloride: 101 mmol/L (ref 96–106)
Creatinine, Ser: 0.79 mg/dL (ref 0.76–1.27)
Globulin, Total: 1.8 g/dL (ref 1.5–4.5)
Glucose: 125 mg/dL — ABNORMAL HIGH (ref 70–99)
Potassium: 3.9 mmol/L (ref 3.5–5.2)
Sodium: 139 mmol/L (ref 134–144)
Total Protein: 6 g/dL (ref 6.0–8.5)
eGFR: 98 mL/min/{1.73_m2}

## 2021-05-04 LAB — BAYER DCA HB A1C WAIVED: HB A1C (BAYER DCA - WAIVED): 5.4 % (ref 4.8–5.6)

## 2021-05-04 MED ORDER — HYDROCHLOROTHIAZIDE 25 MG PO TABS: 25.0000 mg | ORAL_TABLET | Freq: Every day | ORAL | 3 refills | Status: DC

## 2021-05-04 MED ORDER — SIMVASTATIN 40 MG PO TABS: 40.0000 mg | ORAL_TABLET | Freq: Every day | ORAL | 3 refills | Status: DC

## 2021-05-04 MED ORDER — LOSARTAN POTASSIUM 100 MG PO TABS: 100.0000 mg | ORAL_TABLET | Freq: Every day | ORAL | 3 refills | Status: DC

## 2021-05-04 MED ORDER — MELOXICAM 15 MG PO TABS: 15.0000 mg | ORAL_TABLET | Freq: Every day | ORAL | 3 refills | Status: DC

## 2021-05-04 MED ORDER — SILDENAFIL CITRATE 20 MG PO TABS: 20.0000 mg | ORAL_TABLET | Freq: Every day | ORAL | 5 refills | Status: DC | PRN

## 2021-05-04 NOTE — Progress Notes (Signed)
BP 134/75   Pulse 75   Ht _0  (1.702 m)   Wt 241 lb (109.3 kg)   SpO2 95%   BMI 37.75 kg/m    Subjective:   Patient ID: Steve Williams, male    DOB: 10-02-1955, 65 y.o.   MRN: [id number]  HPI: Steve Williams is a 65 y.o. male presenting on 05/04/2021 for Medical Management of Chronic Issues and Diabetes   HPI Type 2 diabetes mellitus Patient com has not es in today for recheck of his diabetes. Patient has been currently taking medication currently, A1c 5.4. Patient is currently on an ACE inhibitor/ARB. Patient has not seen an ophthalmologist this year. Patient denies any issues with their feet. The symptom started onset as an adult hypertension and hyperlipidemia ARE RELATED TO DM   Hypertension Patient is currently on losartan and hydrochlorothiazide, and their blood pressure today is 134/75. Patient denies any lightheadedness or dizziness. Patient denies headaches, blurred vision, chest pains, shortness of breath, or weakness. Denies any side effects from medication and is content with current medication.   Hyperlipidemia Patient is coming in for recheck of his hyperlipidemia. The patient is currently taking simvastatin. They deny any issues with myalgias or history of liver damage from it. They deny any focal numbness or weakness or chest pain.   Relevant past medical, surgical, family and social history reviewed and updated as indicated. Interim medical history since our last visit reviewed. Allergies and medications reviewed and updated.  Review of Systems  Constitutional:  Negative for chills and fever.  Eyes:  Negative for visual disturbance.  Respiratory:  Negative for shortness of breath and wheezing.   Cardiovascular:  Negative for chest pain and leg swelling.  Musculoskeletal:  Positive for arthralgias. Negative for back pain and gait problem.  Skin:  Negative for rash.  Neurological:  Negative for dizziness, weakness and light-headedness.  All other systems reviewed  and are negative.  Per HPI unless specifically indicated above   Allergies as of 05/04/2021       Reactions   Lisinopril    Erythromycin Rash        Medication List        Accurate as of May 04, 2021  8:15 AM. If you have any questions, ask your nurse or doctor.          aspirin 81 MG tablet Take 81 mg by mouth daily.   B-D 3CC LUER-LOK SYR 22GX1-1/2 22G X 1-1/2" 3 ML Misc Generic drug: SYRINGE-NEEDLE (DISP) 3 ML USE TO GIVE TESTOSTERONE   B-D 3CC LUER-LOK SYR 22GX1" 22G X 1" 3 ML Misc Generic drug: SYRINGE-NEEDLE (DISP) 3 ML USE TO GIVE TESTOSTERONE   BD Disp Needles 18G X 1-1/2" Misc Generic drug: NEEDLE (DISP) 18 G USE TO DRAW UP TESTOSTERONE   fluticasone 50 MCG/ACT nasal spray Commonly known as: FLONASE Place into both nostrils daily.   hydrochlorothiazide 25 MG tablet Commonly known as: HYDRODIURIL TAKE 1 TABLET BY MOUTH EVERY DAY   loratadine 10 MG tablet Commonly known as: CLARITIN Take 10 mg by mouth daily.   losartan 100 MG tablet Commonly known as: COZAAR TAKE 1 TABLET BY MOUTH EVERY DAY   meloxicam 15 MG tablet Commonly known as: MOBIC TAKE 1 TABLET BY MOUTH DAILY   montelukast 10 MG tablet Commonly known as: SINGULAIR Take 1 tablet (10 mg total) by mouth at bedtime.   sertraline 100 MG tablet Commonly known as: ZOLOFT TAKE 1 TABLET BY MOUTH DAILY  sildenafil 20 MG tablet Commonly known as: REVATIO TAKE 1-5 TABLETS BY MOUTH ONCE DAILY AS NEEDED FOR ERECTILE DYSFUNCTION   simvastatin 40 MG tablet Commonly known as: ZOCOR TAKE 1 TABLET BY MOUTH DAILY   testosterone cypionate 200 MG/ML injection Commonly known as: DEPOTESTOSTERONE CYPIONATE INJECT 0.5ML INTRAMUSCULARLY EVERY SEVEN DAYS         Objective:   BP 134/75   Pulse 75   Ht _0  (1.702 m)   Wt 241 lb (109.3 kg)   SpO2 95%   BMI 37.75 kg/m   Wt Readings from Last 3 Encounters:  05/04/21 241 lb (109.3 kg)  04/25/21 249 lb (112.9 kg)  11/01/20 255 lb  (115.7 kg)    Physical Exam Vitals and nursing note reviewed.  Constitutional:      General: He is not in acute distress.    Appearance: He is well-developed. He is not diaphoretic.  Eyes:     General: No scleral icterus.    Conjunctiva/sclera: Conjunctivae normal.  Neck:     Thyroid: No thyromegaly.  Cardiovascular:     Rate and Rhythm: Normal rate and regular rhythm.     Heart sounds: Normal heart sounds. No murmur heard. Pulmonary:     Effort: Pulmonary effort is normal. No respiratory distress.     Breath sounds: Normal breath sounds. No wheezing.  Musculoskeletal:     Cervical back: Neck supple.  Lymphadenopathy:     Cervical: No cervical adenopathy.  Skin:    General: Skin is warm and dry.     Findings: No rash.  Neurological:     Mental Status: He is alert and oriented to person, place, and time.     Coordination: Coordination normal.  Psychiatric:        Behavior: Behavior normal.      Assessment & Plan:   Problem List Items Addressed This Visit       Cardiovascular and Mediastinum   Hypertension associated with diabetes ([place])   Relevant Orders   CBC with Differential/Platelet   CMP14+EGFR   Lipid panel   Bayer DCA Hb A1c Waived     Endocrine   DM2 (diabetes mellitus, type 2) ([place]) - Primary   Relevant Orders   CBC with Differential/Platelet   CMP14+EGFR   Lipid panel   Bayer DCA Hb A1c Waived   Hyperlipidemia associated with type 2 diabetes mellitus ([place])   Relevant Orders   CBC with Differential/Platelet   CMP14+EGFR   Lipid panel   Bayer DCA Hb A1c Waived    Patient's A1c was good at 5.4 Seems to be doing well, losing weight and being more active. Follow up plan: Return in about 6 months (around 11/02/2021), or if symptoms worsen or fail to improve, for Physical exam and diabetes and hypertension and cholesterol.  Counseling provided for all of the vaccine components Orders Placed This Encounter  Procedures   CBC with Differential/Platelet    CMP14+EGFR   Lipid panel   Bayer DCA Hb A1c Waived    Caryl Pina, MD [facility] 05/04/2021, 8:15 AM

## 2021-05-30 NOTE — Progress Notes (Signed)
History of Present Illness: Here for follow-up of ED, hypogonadism and lower urinary tract symptomatology.  He is on 100 mg of testosterone cypionate, administered every Sunday.  He has not had labs since last year.  At that time, testosterone was 540, PSA 1.5.  He does have lower urinary tract symptomatology, mainly frequency and urgency.  Symptoms have been improved with him coming off of caffeine.  He has a good stream, feels like he empties well.  Current IPSS 9, quality-of-life score 3.  Not on any bladder/prostate medications.  He does take sildenafil up to 100 mg at a time.  This works well.  Past Medical History:  Diagnosis Date   Allergy    Anxiety    Arthritis    Asthma    Cancer ([place])    Depression    Diabetes mellitus without complication ([place])    Hyperlipidemia    Hypertension     Past Surgical History:  Procedure Laterality Date   CARPAL BONE EXCISION Bilateral    KNEE ARTHROSCOPY Left    KNEE SURGERY Left 1974   lipoma removed     TRIGGER FINGER RELEASE Right     Home Medications:  Allergies as of 05/31/2021       Reactions   Lisinopril    Erythromycin Rash        Medication List        Accurate as of May 30, 2021  7:12 PM. If you have any questions, ask your nurse or doctor.          aspirin 81 MG tablet Take 81 mg by mouth daily.   B-D 3CC LUER-LOK SYR 22GX1-1/2 22G X 1-1/2" 3 ML Misc Generic drug: SYRINGE-NEEDLE (DISP) 3 ML USE TO GIVE TESTOSTERONE   B-D 3CC LUER-LOK SYR 22GX1" 22G X 1" 3 ML Misc Generic drug: SYRINGE-NEEDLE (DISP) 3 ML USE TO GIVE TESTOSTERONE   BD Disp Needles 18G X 1-1/2" Misc Generic drug: NEEDLE (DISP) 18 G USE TO DRAW UP TESTOSTERONE   fluticasone 50 MCG/ACT nasal spray Commonly known as: FLONASE Place into both nostrils daily.   hydrochlorothiazide 25 MG tablet Commonly known as: HYDRODIURIL Take 1 tablet (25 mg total) by mouth daily.   loratadine 10 MG tablet Commonly known as: CLARITIN Take  10 mg by mouth daily.   losartan 100 MG tablet Commonly known as: COZAAR Take 1 tablet (100 mg total) by mouth daily.   meloxicam 15 MG tablet Commonly known as: MOBIC Take 1 tablet (15 mg total) by mouth daily.   montelukast 10 MG tablet Commonly known as: SINGULAIR Take 1 tablet (10 mg total) by mouth at bedtime.   sertraline 100 MG tablet Commonly known as: ZOLOFT TAKE 1 TABLET BY MOUTH DAILY   sildenafil 20 MG tablet Commonly known as: REVATIO Take 1-5 tablets (20-100 mg total) by mouth daily as needed.   simvastatin 40 MG tablet Commonly known as: ZOCOR Take 1 tablet (40 mg total) by mouth daily.   testosterone cypionate 200 MG/ML injection Commonly known as: DEPOTESTOSTERONE CYPIONATE INJECT 0.5ML INTRAMUSCULARLY EVERY SEVEN DAYS        Allergies:  Allergies  Allergen Reactions   Lisinopril    Erythromycin Rash    Family History  Problem Relation Age of Onset   Heart disease Mother    Hypertension Mother    Diabetes Mother    Heart disease Father    Cancer Father        liver   Diabetes Father  Hypertension Sister    Diabetes Sister    Migraines Son     Social History:  reports that he has never smoked. He has never used smokeless tobacco. He reports that he does not drink alcohol and does not use drugs.  ROS: A complete review of systems was performed.  All systems are negative except for pertinent findings as noted.  Physical Exam:  Vital signs in last 24 hours: There were no vitals taken for this visit. Constitutional:  Alert and oriented, No acute distress Cardiovascular: Regular rate  Respiratory: Normal respiratory effort GI: Abdomen is soft, nontender, nondistended, no abdominal masses. No CVAT.  Genitourinary: Normal uncircumcised male phallus, somewhat buried, testes are descended bilaterally and non-tender and without masses, scrotum is normal in appearance without lesions or masses, perineum is normal on inspection.  Normal anal  sphincter tone, prostate 30 g, symmetric, nonnodular, nontender. Lymphatic: No lymphadenopathy Neurologic: Grossly intact, no focal deficits Psychiatric: Normal mood and affect  I have reviewed prior pt notes  I have reviewed notes from referring/previous physicians  I have reviewed urinalysis results    Impression/Assessment:  1.  ED, adequate management with sildenafil  2.  Hypogonadism, on testosterone weekly  3.  Lower urinary tract symptoms, improved with behavioral modification  Plan:  1.  Appropriate labs drawn today  2.  Sildenafil/testosterone/needles refilled  3.  Office visit in 1 year

## 2021-05-31 ENCOUNTER — Encounter: Payer: Self-pay | Admitting: Urology

## 2021-05-31 ENCOUNTER — Other Ambulatory Visit: Payer: Self-pay

## 2021-05-31 ENCOUNTER — Ambulatory Visit: Payer: Medicare Other | Admitting: Urology

## 2021-05-31 VITALS — BP 127/80 | HR 71 | Temp 98.4°F

## 2021-05-31 DIAGNOSIS — E291 Testicular hypofunction: Secondary | ICD-10-CM | POA: Diagnosis not present

## 2021-05-31 DIAGNOSIS — N3281 Overactive bladder: Secondary | ICD-10-CM | POA: Diagnosis not present

## 2021-05-31 LAB — URINALYSIS, ROUTINE W REFLEX MICROSCOPIC
Bilirubin, UA: NEGATIVE
Glucose, UA: NEGATIVE
Ketones, UA: NEGATIVE
Leukocytes,UA: NEGATIVE
Nitrite, UA: NEGATIVE
Protein,UA: NEGATIVE
RBC, UA: NEGATIVE
Specific Gravity, UA: 1.02 (ref 1.005–1.030)
Urobilinogen, Ur: 0.2 mg/dL (ref 0.2–1.0)
pH, UA: 7.5 (ref 5.0–7.5)

## 2021-05-31 MED ORDER — SILDENAFIL CITRATE 100 MG PO TABS: 100.0000 mg | ORAL_TABLET | Freq: Every day | ORAL | 99 refills | Status: DC | PRN

## 2021-05-31 MED ORDER — "BD LUER-LOK SYRINGE 22G X 1"" 3 ML MISC": 6 refills | Status: DC

## 2021-05-31 MED ORDER — "BD DISP NEEDLES 18G X 1-1/2"" MISC": 6 refills | Status: DC

## 2021-05-31 MED ORDER — TESTOSTERONE CYPIONATE 200 MG/ML IM SOLN: INTRAMUSCULAR | 3 refills | Status: DC

## 2021-05-31 NOTE — Progress Notes (Signed)
Urological Symptom Review  Patient is experiencing the following symptoms: Hard to postpone urination   Review of Systems  Gastrointestinal (upper)  : Negative for upper GI symptoms  Gastrointestinal (lower) : Negative for lower GI symptoms  Constitutional : Negative for symptoms  Skin: Negative for skin symptoms  Eyes: Negative for eye symptoms  Ear/Nose/Throat : Negative for Ear/Nose/Throat symptoms  Hematologic/Lymphatic: Negative for Hematologic/Lymphatic symptoms  Cardiovascular : Negative for cardiovascular symptoms  Respiratory : Negative for respiratory symptoms  Endocrine: Negative for endocrine symptoms  Musculoskeletal: Negative for musculoskeletal symptoms  Neurological: Negative for neurological symptoms  Psychologic: Negative for psychiatric symptoms

## 2021-06-01 ENCOUNTER — Other Ambulatory Visit: Payer: Self-pay

## 2021-06-01 DIAGNOSIS — E291 Testicular hypofunction: Secondary | ICD-10-CM

## 2021-06-01 LAB — PSA: Prostate Specific Ag, Serum: 1.9 ng/mL (ref 0.0–4.0)

## 2021-06-01 MED ORDER — "SYRINGE/NEEDLE (DISP) 18G X 1"" 3 ML MISC": 6 refills | Status: DC

## 2021-06-10 ENCOUNTER — Ambulatory Visit (INDEPENDENT_AMBULATORY_CARE_PROVIDER_SITE_OTHER): Payer: Medicare Other | Admitting: Nurse Practitioner

## 2021-06-10 ENCOUNTER — Other Ambulatory Visit: Payer: Self-pay

## 2021-06-10 ENCOUNTER — Encounter: Payer: Self-pay | Admitting: Nurse Practitioner

## 2021-06-10 VITALS — BP 138/73 | HR 71 | Temp 98.3°F | Resp 20 | Ht 67.0 in | Wt 243.0 lb

## 2021-06-10 DIAGNOSIS — M25462 Effusion, left knee: Secondary | ICD-10-CM | POA: Diagnosis not present

## 2021-06-10 MED ORDER — PREDNISONE 20 MG PO TABS: 40.0000 mg | ORAL_TABLET | Freq: Every day | ORAL | 0 refills | Status: AC

## 2021-06-10 NOTE — Patient Instructions (Signed)
Knee Effusion Knee effusion means that you have extra fluid in your knee. This can cause pain and swelling. Your knee may be more difficult to bend and move. Common causes of knee effusion are: Arthritis. Infection. Injury. Autoimmune disease. This means that your body's defense system (immune system) mistakenly attacks healthy body tissues. Follow these instructions at home: If you have a brace or an immobilizer: Wear it as told by your doctor. Take it off only as told by your doctor. Check the skin around it every day. Tell your doctor if you see problems. Loosen the brace or immobilizer if your toes: Tingle. Become numb. Turn cold and blue. Keep the brace or immobilizer clean. If the brace or immobilizer is not waterproof: Do not let it get wet. Cover it with a watertight covering when you take a bath or shower. Managing pain, stiffness, and swelling  If told, put ice on the affected area. To do this: If you have a removable brace or immobilizer, take it off as told by your doctor. Put ice in a plastic bag. Place a towel between your skin and the bag. Leave the ice on for 20 minutes, 2-3 times a day. Take off the ice if your skin turns bright red. This is very important. If you cannot feel pain, heat, or cold, you have a greater risk of damage to the area. Move your toes often. Raise your knee above the level of your heart while you are sitting or lying down. Activity Rest as told by your doctor. Use crutches to support your body weight. Do not use your injured leg to support your body weight until your doctor says that you can. Do exercises as told by your doctor. General instructions Take over-the-counter and prescription medicines only as told by your doctor. Do not smoke or use any products that contain nicotine or tobacco. If you need help quitting, ask your doctor. Wear an elastic bandage or a wrap that puts pressure on your knee (compression wrap) as told by your  doctor. Keep all follow-up visits. Contact a doctor if: You continue to have pain in your knee. You have a fever or chills. Get help right away if: You have swelling or redness in your knee that gets worse or does not get better. You have very bad pain in your knee. Summary Knee effusion is when you have extra fluid in your knee. This can cause pain and swelling and can make it hard to bend and move your knee. Take over-the-counter and prescription medicines only as told by your doctor. If you have a brace or an immobilizer, wear it as told by your doctor. This information is not intended to replace advice given to you by your health care provider. Make sure you discuss any questions you have with your health care provider. Document Revised: 03/17/2020 Document Reviewed: 03/17/2020 Elsevier Patient Education  2022 [employer].

## 2021-06-10 NOTE — Progress Notes (Signed)
   Subjective:    Patient ID: Steve Williams, male    DOB: 27-Mar-1956, 65 y.o.   MRN: [id number]   Chief Complaint: Knee Pain (Fluid on knee/Left/)   HPI Patient come sin stating that he slipped and rolled his knee underneath his body. It did not hurt when it happened and really doe snot hurt now. But has fluid on his knee and feels tight. He had steroid injection in both knees on 04/25/21.     Review of Systems  Constitutional:  Negative for diaphoresis.  Eyes:  Negative for pain.  Respiratory:  Negative for shortness of breath.   Cardiovascular:  Negative for chest pain, palpitations and leg swelling.  Gastrointestinal:  Negative for abdominal pain.  Endocrine: Negative for polydipsia.  Skin:  Negative for rash.  Neurological:  Negative for dizziness, weakness and headaches.  Hematological:  Does not bruise/bleed easily.  All other systems reviewed and are negative.     Objective:   Physical Exam Vitals reviewed.  Constitutional:      Appearance: Normal appearance.  Cardiovascular:     Rate and Rhythm: Normal rate and regular rhythm.     Heart sounds: Normal heart sounds.  Pulmonary:     Effort: Pulmonary effort is normal.     Breath sounds: Normal breath sounds.  Skin:    General: Skin is warm.  Neurological:     General: No focal deficit present.     Mental Status: He is alert.    BP 138/73   Pulse 71   Temp 98.3 F (36.8 C) (Temporal)   Resp 20   Ht 5\' 7"  (1.702 m)   Wt 243 lb (110.2 kg)   SpO2 96%   BMI 38.06 kg/m     I & D  Date/Time: 06/10/2021 3:27 PM Performed by: Chevis Pretty, FNP Authorized by: Chevis Pretty, FNP   Consent:    Consent obtained:  Verbal   Risks, benefits, and alternatives were discussed: yes     Risks discussed:  Bleeding, incomplete drainage and infection   Alternatives discussed:  No treatment and observation Location:    Type:  Bursa   Location:  Lower extremity   Lower extremity location:  Knee   Knee  location:  L knee Pre-procedure details:    Skin preparation:  Povidone-iodine Sedation:    Sedation type:  None Anesthesia:    Anesthesia method:  Topical application Procedure type:    Complexity:  Simple Procedure details:    Needle aspiration: yes     Wound management:  Debrided   Drainage:  Bloody   Drainage amount:  Moderate (30CC) Post-procedure details:    Procedure completion:  Tolerated well, no immediate complications     Assessment & Plan:  Steve Williams in today with chief complaint of Knee Pain (Fluid on knee/Left/)   1. Effusion of bursa of left knee Ice Elevate  Wear compression wrap Motrin OTC RTO Prn    The above assessment and management plan was discussed with the patient. The patient verbalized understanding of and has agreed to the management plan. Patient is aware to call the clinic if symptoms persist or worsen. Patient is aware when to return to the clinic for a follow-up visit. Patient educated on when it is appropriate to go to the emergency department.   Mary-Margaret Hassell Done, FNP

## 2021-08-26 ENCOUNTER — Ambulatory Visit (INDEPENDENT_AMBULATORY_CARE_PROVIDER_SITE_OTHER): Payer: Medicare Other

## 2021-08-26 VITALS — Ht 67.0 in | Wt 243.0 lb

## 2021-08-26 DIAGNOSIS — Z0000 Encounter for general adult medical examination without abnormal findings: Secondary | ICD-10-CM

## 2021-08-26 NOTE — Progress Notes (Signed)
Subjective:   Steve Williams is a 66 y.o. male who presents for an Initial Medicare Annual Wellness Visit.  Virtual Visit via Telephone Note  I connected with  Steve Williams on 08/26/21 at  2:00 PM EST by telephone and verified that I am speaking with the correct person using two identifiers.  Location: Patient: Home Provider: [facility] Persons participating in the virtual visit: patient/Nurse Health Advisor   I discussed the limitations, risks, security and privacy concerns of performing an evaluation and management service by telephone and the availability of in person appointments. The patient expressed understanding and agreed to proceed.  Interactive audio and video telecommunications were attempted between this nurse and patient, however failed, due to patient having technical difficulties OR patient did not have access to video capability.  We continued and completed visit with audio only.  Some vital signs may be absent or patient reported.   Sumayyah Custodio E Lourine Alberico, LPN   Review of Systems     Cardiac Risk Factors include: advanced age (>53men, >46 women);sedentary lifestyle;obesity (BMI >30kg/m2);male gender;dyslipidemia;hypertension;diabetes mellitus;Other (see comment), Risk factor comments: OSA on CPAP     Objective:    Today's Vitals   08/26/21 1336  Weight: 243 lb (110.2 kg)  Height: 5\' 7"  (1.702 m)   Body mass index is 38.06 kg/m.  Advanced Directives 08/26/2021 08/25/2020  Does Patient Have a Medical Advance Directive? No No  Would patient like information on creating a medical advance directive? No - Patient declined No - Patient declined    Current Medications (verified) Outpatient Encounter Medications as of 08/26/2021  Medication Sig   aspirin 81 MG tablet Take 81 mg by mouth daily.   B-D 3CC LUER-LOK SYR 22GX1" 22G X 1" 3 ML MISC USE TO GIVE TESTOSTERONE   famotidine (PEPCID) 20 MG tablet Take 20 mg by mouth at bedtime.   fluticasone (FLONASE) 50 MCG/ACT nasal  spray Place into both nostrils daily.   hydrochlorothiazide (HYDRODIURIL) 25 MG tablet Take 1 tablet (25 mg total) by mouth daily.   loratadine (CLARITIN) 10 MG tablet Take 10 mg by mouth daily.   losartan (COZAAR) 100 MG tablet Take 1 tablet (100 mg total) by mouth daily.   meloxicam (MOBIC) 15 MG tablet Take 1 tablet (15 mg total) by mouth daily.   montelukast (SINGULAIR) 10 MG tablet Take 1 tablet (10 mg total) by mouth at bedtime.   NEEDLE, DISP, 18 G (BD DISP NEEDLES) 18G X 1-1/2" MISC USE TO DRAW UP TESTOSTERONE   sertraline (ZOLOFT) 100 MG tablet TAKE 1 TABLET BY MOUTH DAILY   sildenafil (REVATIO) 20 MG tablet Take 1-5 tablets (20-100 mg total) by mouth daily as needed.   sildenafil (VIAGRA) 100 MG tablet Take 1 tablet (100 mg total) by mouth daily as needed for erectile dysfunction.   simvastatin (ZOCOR) 40 MG tablet Take 1 tablet (40 mg total) by mouth daily.   Syringe/Needle, Disp, 18G X 1" 3 ML MISC Use as directed Use to draw up testosterone   testosterone cypionate (DEPOTESTOSTERONE CYPIONATE) 200 MG/ML injection INJECT 0.5ML INTRAMUSCULARLY EVERY SEVEN DAYS   No facility-administered encounter medications on file as of 08/26/2021.    Allergies (verified) Lisinopril and Erythromycin   History: Past Medical History:  Diagnosis Date   Allergy    Anxiety    Arthritis    Asthma    Cancer ([place])    Depression    Diabetes mellitus without complication ([place])    Hyperlipidemia    Hypertension  Past Surgical History:  Procedure Laterality Date   CARPAL BONE EXCISION Bilateral    KNEE ARTHROSCOPY Left    KNEE SURGERY Left 1974   lipoma removed     TRIGGER FINGER RELEASE Right    Family History  Problem Relation Age of Onset   Heart disease Mother    Hypertension Mother    Diabetes Mother    Heart disease Father    Cancer Father        liver   Diabetes Father    Hypertension Sister    Diabetes Sister    Migraines Son    Social History   Socioeconomic History    Marital status: Legally Separated    Spouse name: Not on file   Number of children: 2   Years of education: Not on file   Highest education level: Not on file  Occupational History   Occupation: metal scrapping part time    Employer: [employer] S[employer]  Tobacco Use   Smoking status: Never   Smokeless tobacco: Never  Substance and Sexual Activity   Alcohol use: No   Drug use: No   Sexual activity: Not on file  Other Topics Concern   Not on file  Social History Narrative   One son lives in [place], other son in [place] Resource Strain: Low Risk    Difficulty of Paying Living Expenses: Not hard at all  Food Insecurity: No Food Insecurity   Worried About Charity fundraiser in the Last Year: Never true   Arboriculturist in the Last Year: Never true  Transportation Needs: No Transportation Needs   Lack of Transportation (Medical): No   Lack of Transportation (Non-Medical): No  Physical Activity: Sufficiently Active   Days of Exercise per Week: 5 days   Minutes of Exercise per Session: 60 min  Stress: No Stress Concern Present   Feeling of Stress : Not at all  Social Connections: Moderately Isolated   Frequency of Communication with Friends and Family: More than three times a week   Frequency of Social Gatherings with Friends and Family: More than three times a week   Attends Religious Services: Never   Marine scientist or Organizations: Yes   Attends Music therapist: More than 4 times per year   Marital Status: Separated    Tobacco Counseling Counseling given: Not Answered   Clinical Intake:  Pre-visit preparation completed: Yes  Pain : No/denies pain     BMI - recorded: 38.06 Nutritional Status: BMI > 30  Obese Nutritional Risks: None Diabetes: No  How often do you need to have someone help you when you read instructions, pamphlets, or other written materials from your doctor or  pharmacy?: 1 - Never  Diabetic? Hx of DM; borderline now - no meds  Interpreter Needed?: No  Information entered by :: Tzirel Leonor, LPN   Activities of Daily Living In your present state of health, do you have any difficulty performing the following activities: 08/26/2021  Hearing? Y  Comment wears hearing aids  Vision? N  Difficulty concentrating or making decisions? N  Walking or climbing stairs? Y  Comment hurts knees  Dressing or bathing? N  Doing errands, shopping? N  Preparing Food and eating ? N  Using the Toilet? N  In the past six months, have you accidently leaked urine? Y  Comment routine visits with Urologist  Do you have problems with  loss of bowel control? N  Managing your Medications? N  Managing your Finances? N  Housekeeping or managing your Housekeeping? N  Some recent data might be hidden    Patient Care Team: Dettinger, Fransisca Kaufmann, MD as PCP - General (Family Medicine)  Indicate any recent Medical Services you may have received from other than [facility] providers in the past year (date may be approximate).     Assessment:   This is a routine wellness examination for Steve Williams.  Hearing/Vision screen Hearing Screening - Comments:: Wears hearing aids sometimes - routine visits with audiologist in [place] - Comments:: Wears rx glasses - up to date with annual eye exams with [facility] - plans to [facility] going to [facility]  Dietary issues and exercise activities discussed: Current Exercise Habits: The patient has a physically strenuous job, but has no regular exercise apart from work., Exercise limited by: orthopedic condition(s)   Goals Addressed             This Visit's Progress    Achieve a Healthy Weight-Obesity       Timeframe:  Long-Range Goal Priority:  Medium Start Date:                             Expected End Date:                       Follow Up Date 08/29/2022    - change to whole grain breads, cereal, pasta - eat 5  or 6 small meals each day - eat fish at least once per week - join a weight loss program - limit fast food meals to no more than 1 per week - set a realistic goal    Why is this important?   When you are ready to manage your weight, have a plan and have set a goal, it is time to take action.  Taking small steps to change how you eat and exercise is a good place to start.    Notes:        Depression Screen PHQ 2/9 Scores 08/26/2021 06/10/2021 05/04/2021 04/25/2021 11/01/2020 08/25/2020 05/07/2020  PHQ - 2 Score 0 0 0 0 0 0 0  PHQ- 9 Score - 0 - - - - -    Fall Risk Fall Risk  08/26/2021 06/10/2021 05/04/2021 04/25/2021 11/01/2020  Falls in the past year? 1 0 0 0 0  Number falls in past yr: 0 - - - -  Injury with Fall? 0 - - - -  Risk for fall due to : History of fall(s);Orthopedic patient - - - -  Follow up Education provided;Falls prevention discussed - - - -    FALL RISK PREVENTION PERTAINING TO THE HOME:  Any stairs in or around the home? Yes  If so, are there any without handrails? No  Home free of loose throw rugs in walkways, pet beds, electrical cords, etc? Yes  Adequate lighting in your home to reduce risk of falls? Yes   ASSISTIVE DEVICES UTILIZED TO PREVENT FALLS:  Life alert? No  Use of a cane, walker or w/c? No  Grab bars in the bathroom? Yes  Shower chair or bench in shower? Yes  Elevated toilet seat or a handicapped toilet? No   TIMED UP AND GO:  Was the test performed? No . Telephonic visit  Cognitive Function:     6CIT Screen 08/26/2021 08/25/2020  What Year? 0 points 0 points  What month? 0 points 0 points  What time? 0 points 0 points  Count back from 20 0 points 0 points  Months in reverse 0 points 0 points  Repeat phrase 0 points 0 points  Total Score 0 0    Immunizations Immunization History  Administered Date(s) Administered   Influenza, Seasonal, Injecte, Preservative Fre 07/03/2016   Influenza,inj,Quad PF,6+ Mos 05/17/2017    Influenza-Unspecified 04/29/2015, 05/15/2018   PFIZER(Purple Top)SARS-COV-2 Vaccination 04/12/2020, 05/03/2020   Pneumococcal Conjugate-13 01/25/2011   Pneumococcal Polysaccharide-23 04/07/2016   Td 11/01/2009   Tdap 11/01/2009   Zoster, Live 08/17/2010    TDAP status: Due, Education has been provided regarding the importance of this vaccine. Advised may receive this vaccine at local pharmacy or Health Dept. Aware to provide a copy of the vaccination record if obtained from local pharmacy or Health Dept. Verbalized acceptance and understanding.  Flu Vaccine status: Declined, Education has been provided regarding the importance of this vaccine but patient still declined. Advised may receive this vaccine at local pharmacy or Health Dept. Aware to provide a copy of the vaccination record if obtained from local pharmacy or Health Dept. Verbalized acceptance and understanding.  Pneumococcal vaccine status: Up to date  Covid-19 vaccine status: Declined, Education has been provided regarding the importance of this vaccine but patient still declined. Advised may receive this vaccine at local pharmacy or Health Dept.or vaccine clinic. Aware to provide a copy of the vaccination record if obtained from local pharmacy or Health Dept. Verbalized acceptance and understanding.  Qualifies for Shingles Vaccine? Yes   Zostavax completed Yes   Shingrix Completed?: No.    Education has been provided regarding the importance of this vaccine. Patient has been advised to call insurance company to determine out of pocket expense if they have not yet received this vaccine. Advised may also receive vaccine at local pharmacy or Health Dept. Verbalized acceptance and understanding.  Screening Tests Health Maintenance  Topic Date Due   Zoster Vaccines- Shingrix (1 of 2) Never done   TETANUS/TDAP  11/02/2019   OPHTHALMOLOGY EXAM  07/02/2020   Pneumonia Vaccine 60+ Years old (3 - PPSV23 if available, else PCV20)  04/07/2021   INFLUENZA VACCINE  10/28/2021 (Originally 02/28/2021)   HEMOGLOBIN A1C  11/02/2021   FOOT EXAM  05/04/2022   COLONOSCOPY (Pts 45-21yrs Insurance coverage will need to be confirmed)  08/18/2024   HPV VACCINES  Aged Out   COVID-19 Vaccine  Discontinued   Hepatitis C Screening  Discontinued    Health Maintenance  Health Maintenance Due  Topic Date Due   Zoster Vaccines- Shingrix (1 of 2) Never done   TETANUS/TDAP  11/02/2019   OPHTHALMOLOGY EXAM  07/02/2020   Pneumonia Vaccine 45+ Years old (3 - PPSV23 if available, else PCV20) 04/07/2021    Colorectal cancer screening: Type of screening: Colonoscopy. Completed 08/18/2014. Repeat every 10 years  Lung Cancer Screening: (Low Dose CT Chest recommended if Age 69-80 years, 30 pack-year currently smoking OR have quit w/in 15years.) does not qualify.   Additional Screening:  Hepatitis C Screening: does qualify; Declined  Vision Screening: Recommended annual ophthalmology exams for early detection of glaucoma and other disorders of the eye. Is the patient up to date with their annual eye exam?  Yes  Who is the provider or what is the name of the office in which the patient attends annual eye exams? [facility] - plans to change to [facility] in [place] If  pt is not established with a provider, would they like to be referred to a provider to establish care? No .   Dental Screening: Recommended annual dental exams for proper oral hygiene  Community Resource Referral / Chronic Care Management: CRR required this visit?  No   CCM required this visit?  No      Plan:     I have personally reviewed and noted the following in the patients chart:   Medical and social history Use of alcohol, tobacco or illicit drugs  Current medications and supplements including opioid prescriptions. Patient is not currently taking opioid prescriptions. Functional ability and status Nutritional status Physical activity Advanced  directives List of other physicians Hospitalizations, surgeries, and ER visits in previous 12 months Vitals Screenings to include cognitive, depression, and falls Referrals and appointments  In addition, I have reviewed and discussed with patient certain preventive protocols, quality metrics, and best practice recommendations. A written personalized care plan for preventive services as well as general preventive health recommendations were provided to patient.     Sandrea Hammond, LPN   03/28/5620   Nurse Notes: None

## 2021-08-26 NOTE — Patient Instructions (Signed)
[name] , Thank you for taking time to come for your Medicare Wellness Visit. I appreciate your ongoing commitment to your health goals. Please review the following plan we discussed and let me know if I can assist you in the future.   Screening recommendations/referrals: Colonoscopy: Done 08/18/2014 - Repeat in 10 years Recommended yearly ophthalmology/optometry visit for glaucoma screening and checkup Recommended yearly dental visit for hygiene and checkup  Vaccinations: Influenza vaccine: Declined Pneumococcal vaccine: Done 01/25/2011 & 04/07/2016 Tdap vaccine: Done 11/01/2009 - Repeat in 10 years *due Shingles vaccine: Zostavax done 2012 - due for Shingrix   Covid-19: Done 04/12/2020 & 05/03/2020 - declines booster  Advanced directives: Please bring a copy of your health care power of attorney and living will to the office to be added to your chart at your convenience.   Conditions/risks identified: Aim for 30 minutes of exercise or brisk walking each day, drink 6-8 glasses of water and eat lots of fruits and vegetables.   Next appointment: Follow up in one year for your annual wellness visit.   Preventive Care 67 Years and Older, Male  Preventive care refers to lifestyle choices and visits with your health care provider that can promote health and wellness. What does preventive care include? A yearly physical exam. This is also called an annual well check. Dental exams once or twice a year. Routine eye exams. Ask your health care provider how often you should have your eyes checked. Personal lifestyle choices, including: Daily care of your teeth and gums. Regular physical activity. Eating a healthy diet. Avoiding tobacco and drug use. Limiting alcohol use. Practicing safe sex. Taking low doses of aspirin every day. Taking vitamin and mineral supplements as recommended by your health care provider. What happens during an annual well check? The services and screenings done by your  health care provider during your annual well check will depend on your age, overall health, lifestyle risk factors, and family history of disease. Counseling  Your health care provider may ask you questions about your: Alcohol use. Tobacco use. Drug use. Emotional well-being. Home and relationship well-being. Sexual activity. Eating habits. History of falls. Memory and ability to understand (cognition). Work and work Statistician. Screening  You may have the following tests or measurements: Height, weight, and BMI. Blood pressure. Lipid and cholesterol levels. These may be checked every 5 years, or more frequently if you are over 15 years old. Skin check. Lung cancer screening. You may have this screening every year starting at age 48 if you have a 30-pack-year history of smoking and currently smoke or have quit within the past 15 years. Fecal occult blood test (FOBT) of the stool. You may have this test every year starting at age 43. Flexible sigmoidoscopy or colonoscopy. You may have a sigmoidoscopy every 5 years or a colonoscopy every 10 years starting at age 27. Prostate cancer screening. Recommendations will vary depending on your family history and other risks. Hepatitis C blood test. Hepatitis B blood test. Sexually transmitted disease (STD) testing. Diabetes screening. This is done by checking your blood sugar (glucose) after you have not eaten for a while (fasting). You may have this done every 1-3 years. Abdominal aortic aneurysm (AAA) screening. You may need this if you are a current or former smoker. Osteoporosis. You may be screened starting at age 38 if you are at high risk. Talk with your health care provider about your test results, treatment options, and if necessary, the need for more tests. Vaccines  Your health care provider may recommend certain vaccines, such as: Influenza vaccine. This is recommended every year. Tetanus, diphtheria, and acellular pertussis  (Tdap, Td) vaccine. You may need a Td booster every 10 years. Zoster vaccine. You may need this after age 49. Pneumococcal 13-valent conjugate (PCV13) vaccine. One dose is recommended after age 70. Pneumococcal polysaccharide (PPSV23) vaccine. One dose is recommended after age 12. Talk to your health care provider about which screenings and vaccines you need and how often you need them. This information is not intended to replace advice given to you by your health care provider. Make sure you discuss any questions you have with your health care provider. Document Released: 08/13/2015 Document Revised: 04/05/2016 Document Reviewed: 05/18/2015 Elsevier Interactive Patient Education  2017 [employer] Prevention in the Home Falls can cause injuries. They can happen to people of all ages. There are many things you can do to make your home safe and to help prevent falls. What can I do on the outside of my home? Regularly fix the edges of walkways and driveways and fix any cracks. Remove anything that might make you trip as you walk through a door, such as a raised step or threshold. Trim any bushes or trees on the path to your home. Use bright outdoor lighting. Clear any walking paths of anything that might make someone trip, such as rocks or tools. Regularly check to see if handrails are loose or broken. Make sure that both sides of any steps have handrails. Any raised decks and porches should have guardrails on the edges. Have any leaves, snow, or ice cleared regularly. Use sand or salt on walking paths during winter. Clean up any spills in your garage right away. This includes oil or grease spills. What can I do in the bathroom? Use night lights. Install grab bars by the toilet and in the tub and shower. Do not use towel bars as grab bars. Use non-skid mats or decals in the tub or shower. If you need to sit down in the shower, use a plastic, non-slip stool. Keep the floor dry. Clean  up any water that spills on the floor as soon as it happens. Remove soap buildup in the tub or shower regularly. Attach bath mats securely with double-sided non-slip rug tape. Do not have throw rugs and other things on the floor that can make you trip. What can I do in the bedroom? Use night lights. Make sure that you have a light by your bed that is easy to reach. Do not use any sheets or blankets that are too big for your bed. They should not hang down onto the floor. Have a firm chair that has side arms. You can use this for support while you get dressed. Do not have throw rugs and other things on the floor that can make you trip. What can I do in the kitchen? Clean up any spills right away. Avoid walking on wet floors. Keep items that you use a lot in easy-to-reach places. If you need to reach something above you, use a strong step stool that has a grab bar. Keep electrical cords out of the way. Do not use floor polish or wax that makes floors slippery. If you must use wax, use non-skid floor wax. Do not have throw rugs and other things on the floor that can make you trip. What can I do with my stairs? Do not leave any items on the stairs. Make sure that there are  handrails on both sides of the stairs and use them. Fix handrails that are broken or loose. Make sure that handrails are as long as the stairways. Check any carpeting to make sure that it is firmly attached to the stairs. Fix any carpet that is loose or worn. Avoid having throw rugs at the top or bottom of the stairs. If you do have throw rugs, attach them to the floor with carpet tape. Make sure that you have a light switch at the top of the stairs and the bottom of the stairs. If you do not have them, ask someone to add them for you. What else can I do to help prevent falls? Wear shoes that: Do not have high heels. Have rubber bottoms. Are comfortable and fit you well. Are closed at the toe. Do not wear sandals. If you  use a stepladder: Make sure that it is fully opened. Do not climb a closed stepladder. Make sure that both sides of the stepladder are locked into place. Ask someone to hold it for you, if possible. Clearly mark and make sure that you can see: Any grab bars or handrails. First and last steps. Where the edge of each step is. Use tools that help you move around (mobility aids) if they are needed. These include: Canes. Walkers. Scooters. Crutches. Turn on the lights when you go into a dark area. Replace any light bulbs as soon as they burn out. Set up your furniture so you have a clear path. Avoid moving your furniture around. If any of your floors are uneven, fix them. If there are any pets around you, be aware of where they are. Review your medicines with your doctor. Some medicines can make you feel dizzy. This can increase your chance of falling. Ask your doctor what other things that you can do to help prevent falls. This information is not intended to replace advice given to you by your health care provider. Make sure you discuss any questions you have with your health care provider. Document Released: 05/13/2009 Document Revised: 12/23/2015 Document Reviewed: 08/21/2014 Elsevier Interactive Patient Education  2017 [employer].

## 2021-08-27 ENCOUNTER — Other Ambulatory Visit: Payer: Self-pay | Admitting: Family Medicine

## 2021-10-12 DIAGNOSIS — M25561 Pain in right knee: Secondary | ICD-10-CM | POA: Diagnosis not present

## 2021-10-12 DIAGNOSIS — M25562 Pain in left knee: Secondary | ICD-10-CM | POA: Diagnosis not present

## 2021-10-12 DIAGNOSIS — M170 Bilateral primary osteoarthritis of knee: Secondary | ICD-10-CM | POA: Diagnosis not present

## 2021-11-01 ENCOUNTER — Telehealth: Payer: Self-pay | Admitting: Family Medicine

## 2021-11-01 DIAGNOSIS — I152 Hypertension secondary to endocrine disorders: Secondary | ICD-10-CM

## 2021-11-01 DIAGNOSIS — E1169 Type 2 diabetes mellitus with other specified complication: Secondary | ICD-10-CM

## 2021-11-01 DIAGNOSIS — Z0000 Encounter for general adult medical examination without abnormal findings: Secondary | ICD-10-CM

## 2021-11-01 NOTE — Telephone Encounter (Signed)
Labs ordered. Patient aware. AP  ?

## 2021-11-02 ENCOUNTER — Ambulatory Visit (INDEPENDENT_AMBULATORY_CARE_PROVIDER_SITE_OTHER): Payer: Medicare Other | Admitting: Family Medicine

## 2021-11-02 ENCOUNTER — Encounter: Payer: Self-pay | Admitting: Family Medicine

## 2021-11-02 VITALS — BP 111/62 | HR 72 | Ht 67.0 in | Wt 244.0 lb

## 2021-11-02 DIAGNOSIS — E1169 Type 2 diabetes mellitus with other specified complication: Secondary | ICD-10-CM

## 2021-11-02 DIAGNOSIS — I152 Hypertension secondary to endocrine disorders: Secondary | ICD-10-CM

## 2021-11-02 DIAGNOSIS — E1159 Type 2 diabetes mellitus with other circulatory complications: Secondary | ICD-10-CM

## 2021-11-02 DIAGNOSIS — Z0001 Encounter for general adult medical examination with abnormal findings: Secondary | ICD-10-CM | POA: Diagnosis not present

## 2021-11-02 DIAGNOSIS — E785 Hyperlipidemia, unspecified: Secondary | ICD-10-CM

## 2021-11-02 DIAGNOSIS — J302 Other seasonal allergic rhinitis: Secondary | ICD-10-CM | POA: Diagnosis not present

## 2021-11-02 DIAGNOSIS — M25561 Pain in right knee: Secondary | ICD-10-CM | POA: Diagnosis not present

## 2021-11-02 DIAGNOSIS — Z0000 Encounter for general adult medical examination without abnormal findings: Secondary | ICD-10-CM | POA: Diagnosis not present

## 2021-11-02 DIAGNOSIS — F39 Unspecified mood [affective] disorder: Secondary | ICD-10-CM

## 2021-11-02 DIAGNOSIS — M170 Bilateral primary osteoarthritis of knee: Secondary | ICD-10-CM | POA: Diagnosis not present

## 2021-11-02 DIAGNOSIS — M25562 Pain in left knee: Secondary | ICD-10-CM | POA: Diagnosis not present

## 2021-11-02 LAB — BAYER DCA HB A1C WAIVED: HB A1C (BAYER DCA - WAIVED): 5.3 % (ref 4.8–5.6)

## 2021-11-02 MED ORDER — MONTELUKAST SODIUM 10 MG PO TABS: 10.0000 mg | ORAL_TABLET | Freq: Every day | ORAL | 1 refills | Status: DC

## 2021-11-02 MED ORDER — SERTRALINE HCL 100 MG PO TABS: 100.0000 mg | ORAL_TABLET | Freq: Every day | ORAL | 1 refills | Status: DC

## 2021-11-02 NOTE — Progress Notes (Signed)
? ?BP 111/62   Pulse 72   Ht '5\' 7"'$  (1.702 m)   Wt 244 lb (110.7 kg)   SpO2 98%   BMI 38.22 kg/m?   ? ?Subjective:  ? ?Patient ID: Steve Williams, male    DOB: 01/30/1956, 66 y.o.   MRN: [id number] ? ?HPI: ?Steve Williams is a 66 y.o. male presenting on 11/02/2021 for Medical Management of Chronic Issues and Diabetes ? ? ?HPI ?Type 2 diabetes mellitus ?Patient comes in today for recheck of his diabetes. Patient has been currently taking no medicine currently, A1c looks good at 5.3. Patient is currently on an ACE inhibitor/ARB. Patient has not seen an ophthalmologist this year. Patient denies any issues with their feet. The symptom started onset as an adult hypertension and hyperlipidemia ARE RELATED TO DM  ? ?Hypertension ?Patient is currently on hydrochlorothiazide and losartan, and their blood pressure today is 111/62. Patient denies any lightheadedness or dizziness. Patient denies headaches, blurred vision, chest pains, shortness of breath, or weakness. Denies any side effects from medication and is content with current medication.  ? ?Hyperlipidemia ?Patient is coming in for recheck of his hyperlipidemia. The patient is currently taking no medicine currently. They deny any issues with myalgias or history of liver damage from it. They deny any focal numbness or weakness or chest pain.  ? ?Patient takes Singulair for seasonal allergies and has been working well with it. ? ?Anxiety and mood recheck ?Patient takes Zoloft for anxiety and mood.  He feels like he is doing well.  Denies any suicidal ideations.  Denies any major mood swings or issues. No issues ? ?  11/02/2021  ?  9:39 AM 08/26/2021  ?  1:40 PM 06/10/2021  ?  3:01 PM 05/04/2021  ?  8:03 AM 04/25/2021  ?  9:52 AM  ?Depression screen PHQ 2/9  ?Decreased Interest 0 0 0 0 0  ?Down, Depressed, Hopeless 0 0 0 0 0  ?PHQ - 2 Score 0 0 0 0 0  ?Altered sleeping 0  0    ?Tired, decreased energy 0  0    ?Change in appetite 0  0    ?Feeling bad or failure about yourself  0   0    ?Trouble concentrating 0  0    ?Moving slowly or fidgety/restless 0  0    ?Suicidal thoughts 0  0    ?PHQ-9 Score 0  0    ?Difficult doing work/chores   Not difficult at all    ?  ? ?Relevant past medical, surgical, family and social history reviewed and updated as indicated. Interim medical history since our last visit reviewed. ?Allergies and medications reviewed and updated. ? ?Review of Systems  ?Constitutional:  Negative for chills and fever.  ?Eyes:  Negative for visual disturbance.  ?Respiratory:  Negative for shortness of breath and wheezing.   ?Cardiovascular:  Negative for chest pain and leg swelling.  ?Musculoskeletal:  Negative for back pain and gait problem.  ?Skin:  Negative for rash.  ?Neurological:  Negative for dizziness, weakness and light-headedness.  ?All other systems reviewed and are negative. ? ?Per HPI unless specifically indicated above ? ? ?Allergies as of 11/02/2021   ? ?   Reactions  ? Lisinopril   ? Erythromycin Rash  ? ?  ? ?  ?Medication List  ?  ? ?  ? Accurate as of November 02, 2021 10:14 AM. If you have any questions, ask your nurse or doctor.  ?  ?  ? ?  ? ?  STOP taking these medications   ? ?famotidine 20 MG tablet ?Commonly known as: PEPCID ?Stopped by: Worthy Rancher, MD ?  ? ?  ? ?TAKE these medications   ? ?aspirin 81 MG tablet ?Take 81 mg by mouth daily. ?  ?B-D 3CC LUER-LOK SYR 22GX1" 22G X 1" 3 ML Misc ?Generic drug: SYRINGE-NEEDLE (DISP) 3 ML ?USE TO GIVE TESTOSTERONE ?  ?Syringe/Needle (Disp) 18G X 1" 3 ML Misc ?Use as directed ?Use to draw up testosterone ?  ?BD Disp Needles 18G X 1-1/2" Misc ?Generic drug: NEEDLE (DISP) 18 G ?USE TO DRAW UP TESTOSTERONE ?  ?fluticasone 50 MCG/ACT nasal spray ?Commonly known as: FLONASE ?Place into both nostrils daily. ?  ?hydrochlorothiazide 25 MG tablet ?Commonly known as: HYDRODIURIL ?Take 1 tablet (25 mg total) by mouth daily. ?  ?loratadine 10 MG tablet ?Commonly known as: CLARITIN ?Take 10 mg by mouth daily. ?  ?losartan 100 MG  tablet ?Commonly known as: COZAAR ?Take 1 tablet (100 mg total) by mouth daily. ?  ?meloxicam 15 MG tablet ?Commonly known as: MOBIC ?Take 1 tablet (15 mg total) by mouth daily. ?  ?montelukast 10 MG tablet ?Commonly known as: SINGULAIR ?Take 1 tablet (10 mg total) by mouth at bedtime. ?  ?sertraline 100 MG tablet ?Commonly known as: ZOLOFT ?Take 1 tablet (100 mg total) by mouth daily. ?  ?sildenafil 100 MG tablet ?Commonly known as: VIAGRA ?Take 1 tablet (100 mg total) by mouth daily as needed for erectile dysfunction. ?  ?sildenafil 20 MG tablet ?Commonly known as: REVATIO ?Take 1-5 tablets (20-100 mg total) by mouth daily as needed. ?  ?simvastatin 40 MG tablet ?Commonly known as: ZOCOR ?Take 1 tablet (40 mg total) by mouth daily. ?  ?testosterone cypionate 200 MG/ML injection ?Commonly known as: DEPOTESTOSTERONE CYPIONATE ?INJECT 0.5ML INTRAMUSCULARLY EVERY SEVEN DAYS ?  ? ?  ? ? ? ?Objective:  ? ?BP 111/62   Pulse 72   Ht '5\' 7"'$  (1.702 m)   Wt 244 lb (110.7 kg)   SpO2 98%   BMI 38.22 kg/m?   ?Wt Readings from Last 3 Encounters:  ?11/02/21 244 lb (110.7 kg)  ?08/26/21 243 lb (110.2 kg)  ?06/10/21 243 lb (110.2 kg)  ?  ?Physical Exam ?Vitals and nursing note reviewed.  ?Constitutional:   ?   General: He is not in acute distress. ?   Appearance: He is well-developed. He is not diaphoretic.  ?Eyes:  ?   General: No scleral icterus. ?   Conjunctiva/sclera: Conjunctivae normal.  ?Neck:  ?   Thyroid: No thyromegaly.  ?Cardiovascular:  ?   Rate and Rhythm: Normal rate and regular rhythm.  ?   Heart sounds: Normal heart sounds. No murmur heard. ?Pulmonary:  ?   Effort: Pulmonary effort is normal. No respiratory distress.  ?   Breath sounds: Normal breath sounds. No wheezing.  ?Musculoskeletal:     ?   General: Normal range of motion.  ?   Cervical back: Neck supple.  ?Lymphadenopathy:  ?   Cervical: No cervical adenopathy.  ?Skin: ?   General: Skin is warm and dry.  ?   Findings: No rash.  ?Neurological:  ?   Mental  Status: He is alert and oriented to person, place, and time.  ?   Coordination: Coordination normal.  ?Psychiatric:     ?   Behavior: Behavior normal.  ? ? ?Diabetic Foot Exam - Simple   ?Simple Foot Form ?Diabetic Foot exam was performed with the following  findings: Yes 11/02/2021 10:12 AM  ?Visual Inspection ?No deformities, no ulcerations, no other skin breakdown bilaterally: Yes ?Sensation Testing ?Intact to touch and monofilament testing bilaterally: Yes ?Pulse Check ?Posterior Tibialis and Dorsalis pulse intact bilaterally: Yes ?Comments ?  ?  ? ?Assessment & Plan:  ? ?Problem List Items Addressed This Visit   ? ?  ? Cardiovascular and Mediastinum  ? Hypertension associated with diabetes ([place])  ?  ? Respiratory  ? Allergic rhinitis  ? Relevant Medications  ? montelukast (SINGULAIR) 10 MG tablet  ?  ? Endocrine  ? DM2 (diabetes mellitus, type 2) ([place])  ? Hyperlipidemia associated with type 2 diabetes mellitus ([place])  ?  ? Other  ? Morbid obesity ([place])  ? Mood disorder ([place])  ? Relevant Medications  ? sertraline (ZOLOFT) 100 MG tablet  ? ?Other Visit Diagnoses   ? ? Annual physical exam    -  Primary  ? ?  ?  ?A1c looks good, essentially is not diabetic. ? ?He says everything else is looking great.  He denies any major issues.  Continue current medicine.  Continue with diet. ?Follow up plan: ?Return in about 6 months (around 05/04/2022), or if symptoms worsen or fail to improve, for hypertension recheck. ? ?Counseling provided for all of the vaccine components ?No orders of the defined types were placed in this encounter. ? ? ?Caryl Pina, MD ?[place] ?11/02/2021, 10:14 AM ? ? ? ? ?

## 2021-11-03 LAB — COMPREHENSIVE METABOLIC PANEL
ALT: 22 IU/L (ref 0–44)
AST: 16 IU/L (ref 0–40)
Albumin/Globulin Ratio: 2.4 — ABNORMAL HIGH (ref 1.2–2.2)
Albumin: 4.1 g/dL (ref 3.8–4.8)
Alkaline Phosphatase: 64 IU/L (ref 44–121)
BUN/Creatinine Ratio: 24 (ref 10–24)
BUN: 17 mg/dL (ref 8–27)
Bilirubin Total: 0.7 mg/dL (ref 0.0–1.2)
CO2: 23 mmol/L (ref 20–29)
Calcium: 9 mg/dL (ref 8.6–10.2)
Chloride: 100 mmol/L (ref 96–106)
Creatinine, Ser: 0.71 mg/dL — ABNORMAL LOW (ref 0.76–1.27)
Globulin, Total: 1.7 g/dL (ref 1.5–4.5)
Glucose: 129 mg/dL — ABNORMAL HIGH (ref 70–99)
Potassium: 3.9 mmol/L (ref 3.5–5.2)
Sodium: 137 mmol/L (ref 134–144)
Total Protein: 5.8 g/dL — ABNORMAL LOW (ref 6.0–8.5)
eGFR: 101 mL/min/{1.73_m2} (ref >59)

## 2021-11-03 LAB — CBC WITH DIFFERENTIAL/PLATELET
Basophils Absolute: 0.1 10*3/uL (ref 0.0–0.2)
Basos: 1 %
EOS (ABSOLUTE): 0.1 10*3/uL (ref 0.0–0.4)
Eos: 2 %
Hematocrit: 48.6 % (ref 37.5–51.0)
Hemoglobin: 17.2 g/dL (ref 13.0–17.7)
Immature Grans (Abs): 0 10*3/uL (ref 0.0–0.1)
Immature Granulocytes: 1 %
Lymphocytes Absolute: 1.5 10*3/uL (ref 0.7–3.1)
Lymphs: 24 %
MCH: 31 pg (ref 26.6–33.0)
MCHC: 35.4 g/dL (ref 31.5–35.7)
MCV: 88 fL (ref 79–97)
Monocytes Absolute: 0.5 10*3/uL (ref 0.1–0.9)
Monocytes: 8 %
Neutrophils Absolute: 4 10*3/uL (ref 1.4–7.0)
Neutrophils: 64 %
Platelets: 114 10*3/uL — ABNORMAL LOW (ref 150–450)
RBC: 5.55 x10E6/uL (ref 4.14–5.80)
RDW: 12.6 % (ref 11.6–15.4)
WBC: 6.1 10*3/uL (ref 3.4–10.8)

## 2021-11-03 LAB — THYROID PANEL WITH TSH
Free Thyroxine Index: 2.1 (ref 1.2–4.9)
T3 Uptake Ratio: 26 % (ref 24–39)
T4, Total: 8.1 ug/dL (ref 4.5–12.0)
TSH: 0.902 u[IU]/mL (ref 0.450–4.500)

## 2021-11-03 LAB — LIPID PANEL
Chol/HDL Ratio: 3 ratio (ref 0.0–5.0)
Cholesterol, Total: 113 mg/dL (ref 100–199)
HDL: 38 mg/dL — ABNORMAL LOW (ref >39)
LDL Chol Calc (NIH): 48 mg/dL (ref 0–99)
Triglycerides: 158 mg/dL — ABNORMAL HIGH (ref 0–149)
VLDL Cholesterol Cal: 27 mg/dL (ref 5–40)

## 2021-11-09 DIAGNOSIS — M25561 Pain in right knee: Secondary | ICD-10-CM | POA: Diagnosis not present

## 2021-11-09 DIAGNOSIS — M25562 Pain in left knee: Secondary | ICD-10-CM | POA: Diagnosis not present

## 2021-11-09 DIAGNOSIS — M170 Bilateral primary osteoarthritis of knee: Secondary | ICD-10-CM | POA: Diagnosis not present

## 2021-11-10 DIAGNOSIS — M25561 Pain in right knee: Secondary | ICD-10-CM | POA: Diagnosis not present

## 2021-11-10 DIAGNOSIS — M170 Bilateral primary osteoarthritis of knee: Secondary | ICD-10-CM | POA: Diagnosis not present

## 2021-11-16 DIAGNOSIS — M25562 Pain in left knee: Secondary | ICD-10-CM | POA: Diagnosis not present

## 2021-11-16 DIAGNOSIS — M170 Bilateral primary osteoarthritis of knee: Secondary | ICD-10-CM | POA: Diagnosis not present

## 2021-11-16 DIAGNOSIS — M25561 Pain in right knee: Secondary | ICD-10-CM | POA: Diagnosis not present

## 2021-11-23 DIAGNOSIS — M25561 Pain in right knee: Secondary | ICD-10-CM | POA: Diagnosis not present

## 2021-11-23 DIAGNOSIS — M170 Bilateral primary osteoarthritis of knee: Secondary | ICD-10-CM | POA: Diagnosis not present

## 2021-11-23 DIAGNOSIS — M25562 Pain in left knee: Secondary | ICD-10-CM | POA: Diagnosis not present

## 2021-11-30 DIAGNOSIS — M25561 Pain in right knee: Secondary | ICD-10-CM | POA: Diagnosis not present

## 2021-11-30 DIAGNOSIS — M170 Bilateral primary osteoarthritis of knee: Secondary | ICD-10-CM | POA: Diagnosis not present

## 2021-11-30 DIAGNOSIS — M25562 Pain in left knee: Secondary | ICD-10-CM | POA: Diagnosis not present

## 2021-12-06 ENCOUNTER — Other Ambulatory Visit: Payer: Self-pay | Admitting: Urology

## 2021-12-06 DIAGNOSIS — E291 Testicular hypofunction: Secondary | ICD-10-CM

## 2021-12-19 ENCOUNTER — Ambulatory Visit (INDEPENDENT_AMBULATORY_CARE_PROVIDER_SITE_OTHER): Payer: Medicare Other | Admitting: Family Medicine

## 2021-12-19 ENCOUNTER — Encounter: Payer: Self-pay | Admitting: Family Medicine

## 2021-12-19 VITALS — BP 134/69 | HR 73 | Temp 97.9°F | Ht 67.0 in | Wt 250.0 lb

## 2021-12-19 DIAGNOSIS — L918 Other hypertrophic disorders of the skin: Secondary | ICD-10-CM | POA: Diagnosis not present

## 2021-12-19 NOTE — Progress Notes (Signed)
   BP 134/69   Pulse 73   Temp 97.9 F (36.6 C)   Ht '5\' 7"'$  (1.702 m)   Wt 250 lb (113.4 kg)   SpO2 95%   BMI 39.16 kg/m    Subjective:   Patient ID: Steve Williams, male    DOB: 05-Apr-1956, 66 y.o.   MRN: [id number]  HPI: PHELAN SCHADT is a 66 y.o. male presenting on 12/19/2021 for skin tags (Wants removed. Multiple sites neckline, back)   HPI Patient has multiple skin tags on his back and neckline that irritate him and sometimes will get caught when he is getting his haircut or putting his shirt on and sometimes will bleed and he wants to have them removed.  Relevant past medical, surgical, family and social history reviewed and updated as indicated. Interim medical history since our last visit reviewed. Allergies and medications reviewed and updated.  Review of Systems  Skin:  Positive for rash.   Per HPI unless specifically indicated above       Objective:   BP 134/69   Pulse 73   Temp 97.9 F (36.6 C)   Ht '5\' 7"'$  (1.702 m)   Wt 250 lb (113.4 kg)   SpO2 95%   BMI 39.16 kg/m   Wt Readings from Last 3 Encounters:  12/19/21 250 lb (113.4 kg)  11/02/21 244 lb (110.7 kg)  08/26/21 243 lb (110.2 kg)    Physical Exam Vitals and nursing note reviewed.  Constitutional:      Appearance: Normal appearance.  Skin:    Findings: Lesion (Multiple skin tags surrounding neckline and the base of his hairline and his collar line.) present.  Neurological:     Mental Status: He is alert.    Skin tag removal: Using forceps and scissors excised 15 skin tag. Used silver nitrate sticks for hemostasis and pressure dressing with topical antibiotic. Patient tolerated well and had minimal bleeding.   Assessment & Plan:   Problem List Items Addressed This Visit   None Visit Diagnoses     Inflamed skin tag    -  Primary        Follow up plan: Return if symptoms worsen or fail to improve.  Counseling provided for all of the vaccine components No orders of the defined types  were placed in this encounter.   Caryl Pina, MD [facility] 12/19/2021, 3:10 PM

## 2021-12-28 DIAGNOSIS — M25561 Pain in right knee: Secondary | ICD-10-CM | POA: Diagnosis not present

## 2021-12-28 DIAGNOSIS — M170 Bilateral primary osteoarthritis of knee: Secondary | ICD-10-CM | POA: Diagnosis not present

## 2022-01-06 ENCOUNTER — Other Ambulatory Visit: Payer: Self-pay | Admitting: Family Medicine

## 2022-01-27 DIAGNOSIS — M25561 Pain in right knee: Secondary | ICD-10-CM | POA: Diagnosis not present

## 2022-01-27 DIAGNOSIS — M170 Bilateral primary osteoarthritis of knee: Secondary | ICD-10-CM | POA: Diagnosis not present

## 2022-02-24 ENCOUNTER — Other Ambulatory Visit: Payer: Self-pay | Admitting: Family Medicine

## 2022-03-02 ENCOUNTER — Other Ambulatory Visit: Payer: Self-pay | Admitting: Family Medicine

## 2022-03-02 NOTE — Telephone Encounter (Signed)
Last office visit 11/02/21 Last refill 05/31/21, #30, prn refills

## 2022-03-30 ENCOUNTER — Encounter: Payer: Self-pay | Admitting: Family Medicine

## 2022-03-30 ENCOUNTER — Ambulatory Visit (INDEPENDENT_AMBULATORY_CARE_PROVIDER_SITE_OTHER): Payer: Medicare Other | Admitting: Family Medicine

## 2022-03-30 VITALS — BP 131/71 | HR 71 | Temp 98.2°F | Ht 67.0 in | Wt 243.4 lb

## 2022-03-30 DIAGNOSIS — J329 Chronic sinusitis, unspecified: Secondary | ICD-10-CM

## 2022-03-30 DIAGNOSIS — J40 Bronchitis, not specified as acute or chronic: Secondary | ICD-10-CM

## 2022-03-30 DIAGNOSIS — R0981 Nasal congestion: Secondary | ICD-10-CM | POA: Diagnosis not present

## 2022-03-30 DIAGNOSIS — H1013 Acute atopic conjunctivitis, bilateral: Secondary | ICD-10-CM | POA: Diagnosis not present

## 2022-03-30 DIAGNOSIS — T7840XA Allergy, unspecified, initial encounter: Secondary | ICD-10-CM

## 2022-03-30 MED ORDER — BETAMETHASONE SOD PHOS & ACET 6 (3-3) MG/ML IJ SUSP
6.0000 mg | Freq: Once | INTRAMUSCULAR | Status: AC
  Administered 2022-03-30: 6 mg via INTRAMUSCULAR

## 2022-03-30 MED ORDER — AMOXICILLIN-POT CLAVULANATE 875-125 MG PO TABS: 1.0000 | ORAL_TABLET | Freq: Two times a day (BID) | ORAL | 0 refills | Status: DC

## 2022-03-30 MED ORDER — TOBRAMYCIN-DEXAMETHASONE 0.3-0.1 % OP SUSP: OPHTHALMIC | 0 refills | Status: DC

## 2022-03-30 NOTE — Progress Notes (Signed)
Subjective:  Patient ID: Steve Williams, male    DOB: 1955/12/10  Age: 66 y.o. MRN: [id number]  CC: Head Congestion  and Conjunctivitis   HPI Steve Williams presents for   Wasp stung him 1 week ago at left temple. Sx next day of malaise. Now in his eyes. Stomach hurt 2 days later. Uses frozen yogurt to keep him regular. AA lot of dust and debris from a project of demolishing a mobile home. Cough and green sputum since. Denies dyspnea. Alergy testing showed positives, mold, animal danders and others.      03/30/2022    9:16 AM 12/19/2021    2:20 PM 11/02/2021    9:39 AM  Depression screen PHQ 2/9  Decreased Interest 0 0 0  Down, Depressed, Hopeless 0 0 0  PHQ - 2 Score 0 0 0  Altered sleeping  0 0  Tired, decreased energy  0 0  Change in appetite  0 0  Feeling bad or failure about yourself   0 0  Trouble concentrating  0 0  Moving slowly or fidgety/restless  0 0  Suicidal thoughts  0 0  PHQ-9 Score  0 0    History Steve Williams has a past medical history of Allergy, Anxiety, Arthritis, Asthma, Cancer ([place]), Depression, Diabetes mellitus without complication ([place]), Hyperlipidemia, and Hypertension.   He has a past surgical history that includes Knee surgery (Left, 1974); Knee arthroscopy (Left); Carpal bone excision (Bilateral); Trigger finger release (Right); and lipoma removed.   His family history includes Cancer in his father; Diabetes in his father, mother, and sister; Heart disease in his father and mother; Hypertension in his mother and sister; Migraines in his son.He reports that he has never smoked. He has never used smokeless tobacco. He reports that he does not drink alcohol and does not use drugs.    ROS Review of Systems  Constitutional:  Negative for activity change, appetite change, chills and fever.  HENT:  Positive for congestion, postnasal drip, rhinorrhea and sinus pressure. Negative for ear discharge, ear pain, hearing loss, nosebleeds, sneezing and trouble swallowing.    Respiratory:  Negative for chest tightness and shortness of breath.   Cardiovascular:  Negative for chest pain and palpitations.  Skin:  Negative for rash.    Objective:  BP 131/71   Pulse 71   Temp 98.2 F (36.8 C)   Ht '5\' 7"'$  (1.702 m)   Wt 243 lb 6.4 oz (110.4 kg)   SpO2 97%   BMI 38.12 kg/m   BP Readings from Last 3 Encounters:  03/30/22 131/71  12/19/21 134/69  11/02/21 111/62    Wt Readings from Last 3 Encounters:  03/30/22 243 lb 6.4 oz (110.4 kg)  12/19/21 250 lb (113.4 kg)  11/02/21 244 lb (110.7 kg)     Physical Exam Constitutional:      Appearance: He is well-developed.  HENT:     Head: Normocephalic and atraumatic.     Right Ear: Tympanic membrane and external ear normal. No decreased hearing noted.     Left Ear: Tympanic membrane and external ear normal. No decreased hearing noted.     Nose: Mucosal edema and congestion present.     Right Sinus: No frontal sinus tenderness.     Left Sinus: No frontal sinus tenderness.     Mouth/Throat:     Pharynx: No oropharyngeal exudate or posterior oropharyngeal erythema.  Eyes:     Conjunctiva/sclera:     Right eye: Right conjunctiva is injected.  Chemosis present. No exudate or hemorrhage.    Left eye: Left conjunctiva is injected. Chemosis and exudate present. No hemorrhage. Neck:     Meningeal: Brudzinski's sign absent.  Pulmonary:     Effort: No respiratory distress.     Breath sounds: Wheezing present.  Lymphadenopathy:     Head:     Right side of head: No preauricular adenopathy.     Left side of head: No preauricular adenopathy.     Cervical:     Right cervical: No superficial cervical adenopathy.    Left cervical: No superficial cervical adenopathy.       Assessment & Plan:   Steve Williams was seen today for head congestion  and conjunctivitis.  Diagnoses and all orders for this visit:  Head congestion -     COVID-19, Flu A+B and RSV -     betamethasone acetate-betamethasone sodium phosphate  (CELESTONE) injection 6 mg  Allergic reaction, initial encounter -     betamethasone acetate-betamethasone sodium phosphate (CELESTONE) injection 6 mg  Allergic conjunctivitis of both eyes  Sinobronchitis  Other orders -     tobramycin-dexamethasone (TOBRADEX) ophthalmic solution; Apply 1 drop in affected eye(s) every 2 hours for two days. Then every 4 hours for 5 days. -     amoxicillin-clavulanate (AUGMENTIN) 875-125 MG tablet; Take 1 tablet by mouth 2 (two) times daily. Take all of this medication       I am having Maudry Mayhew. Farha "Merry Proud" start on tobramycin-dexamethasone and amoxicillin-clavulanate. I am also having him maintain his aspirin, fluticasone, loratadine, simvastatin, losartan, hydrochlorothiazide, sildenafil, B-D 3CC LUER-LOK SYR 22GX1", BD Disp Needles, Syringe/Needle (Disp), sertraline, montelukast, testosterone cypionate, meloxicam, and sildenafil. We administered betamethasone acetate-betamethasone sodium phosphate.  Allergies as of 03/30/2022       Reactions   Lisinopril    Erythromycin Rash        Medication List        Accurate as of March 30, 2022  5:04 PM. If you have any questions, ask your nurse or doctor.          amoxicillin-clavulanate 875-125 MG tablet Commonly known as: AUGMENTIN Take 1 tablet by mouth 2 (two) times daily. Take all of this medication Started by: Claretta Fraise, MD   aspirin 81 MG tablet Take 81 mg by mouth daily.   B-D 3CC LUER-LOK SYR 22GX1" 22G X 1" 3 ML Misc Generic drug: SYRINGE-NEEDLE (DISP) 3 ML USE TO GIVE TESTOSTERONE   Syringe/Needle (Disp) 18G X 1" 3 ML Misc Use as directed Use to draw up testosterone   BD Disp Needles 18G X 1-1/2" Misc Generic drug: NEEDLE (DISP) 18 G USE TO DRAW UP TESTOSTERONE   fluticasone 50 MCG/ACT nasal spray Commonly known as: FLONASE Place into both nostrils daily.   hydrochlorothiazide 25 MG tablet Commonly known as: HYDRODIURIL Take 1 tablet (25 mg total) by mouth  daily.   loratadine 10 MG tablet Commonly known as: CLARITIN Take 10 mg by mouth daily.   losartan 100 MG tablet Commonly known as: COZAAR Take 1 tablet (100 mg total) by mouth daily.   meloxicam 15 MG tablet Commonly known as: MOBIC TAKE 1 TABLET BY MOUTH EVERY DAY   montelukast 10 MG tablet Commonly known as: SINGULAIR Take 1 tablet (10 mg total) by mouth at bedtime.   sertraline 100 MG tablet Commonly known as: ZOLOFT Take 1 tablet (100 mg total) by mouth daily.   sildenafil 100 MG tablet Commonly known as: VIAGRA Take 1 tablet (100 mg total)  by mouth daily as needed for erectile dysfunction.   sildenafil 20 MG tablet Commonly known as: REVATIO take ONE TO FIVE TABLETS BY MOUTH EVERY DAY AS NEEDED   simvastatin 40 MG tablet Commonly known as: ZOCOR Take 1 tablet (40 mg total) by mouth daily.   testosterone cypionate 200 MG/ML injection Commonly known as: DEPOTESTOSTERONE CYPIONATE INJECT 0.5 ML INTRAMUSCULARLY EVERY 7 DAYS   tobramycin-dexamethasone ophthalmic solution Commonly known as: TobraDex Apply 1 drop in affected eye(s) every 2 hours for two days. Then every 4 hours for 5 days. Started by: Claretta Fraise, MD         Follow-up: Return if symptoms worsen or fail to improve.  Claretta Fraise, M.D.

## 2022-03-31 LAB — COVID-19, FLU A+B AND RSV
Influenza A, NAA: NOT DETECTED
Influenza B, NAA: NOT DETECTED
RSV, NAA: NOT DETECTED
SARS-CoV-2, NAA: NOT DETECTED

## 2022-04-04 NOTE — Progress Notes (Signed)
Hello Hemi,  Your lab result is normal and/or stable.Some minor variations that are not significant are commonly marked abnormal, but do not represent any medical problem for you.  Best regards, Daphane Odekirk, M.D.

## 2022-05-04 ENCOUNTER — Encounter: Payer: Self-pay | Admitting: Family Medicine

## 2022-05-04 ENCOUNTER — Ambulatory Visit (INDEPENDENT_AMBULATORY_CARE_PROVIDER_SITE_OTHER): Payer: Medicare Other | Admitting: Family Medicine

## 2022-05-04 VITALS — BP 130/71 | HR 66 | Temp 97.4°F | Ht 67.0 in | Wt 244.0 lb

## 2022-05-04 DIAGNOSIS — E1159 Type 2 diabetes mellitus with other circulatory complications: Secondary | ICD-10-CM

## 2022-05-04 DIAGNOSIS — J302 Other seasonal allergic rhinitis: Secondary | ICD-10-CM | POA: Diagnosis not present

## 2022-05-04 DIAGNOSIS — E291 Testicular hypofunction: Secondary | ICD-10-CM

## 2022-05-04 DIAGNOSIS — E1169 Type 2 diabetes mellitus with other specified complication: Secondary | ICD-10-CM | POA: Diagnosis not present

## 2022-05-04 DIAGNOSIS — E785 Hyperlipidemia, unspecified: Secondary | ICD-10-CM | POA: Diagnosis not present

## 2022-05-04 DIAGNOSIS — M170 Bilateral primary osteoarthritis of knee: Secondary | ICD-10-CM

## 2022-05-04 DIAGNOSIS — I152 Hypertension secondary to endocrine disorders: Secondary | ICD-10-CM

## 2022-05-04 DIAGNOSIS — F39 Unspecified mood [affective] disorder: Secondary | ICD-10-CM

## 2022-05-04 LAB — BAYER DCA HB A1C WAIVED: HB A1C (BAYER DCA - WAIVED): 5.9 % — ABNORMAL HIGH (ref 4.8–5.6)

## 2022-05-04 MED ORDER — MELOXICAM 15 MG PO TABS: 15.0000 mg | ORAL_TABLET | Freq: Every day | ORAL | 3 refills | Status: DC

## 2022-05-04 MED ORDER — HYDROCHLOROTHIAZIDE 25 MG PO TABS: 25.0000 mg | ORAL_TABLET | Freq: Every day | ORAL | 3 refills | Status: DC

## 2022-05-04 MED ORDER — "BD LUER-LOK SYRINGE 22G X 1"" 3 ML MISC": 6 refills | Status: DC

## 2022-05-04 MED ORDER — SILDENAFIL CITRATE 100 MG PO TABS: 100.0000 mg | ORAL_TABLET | Freq: Every day | ORAL | 99 refills | Status: DC | PRN

## 2022-05-04 MED ORDER — METHYLPREDNISOLONE ACETATE 80 MG/ML IJ SUSP: 80.0000 mg | Freq: Once | INTRAMUSCULAR | Status: DC

## 2022-05-04 MED ORDER — SERTRALINE HCL 100 MG PO TABS: 100.0000 mg | ORAL_TABLET | Freq: Every day | ORAL | 3 refills | Status: DC

## 2022-05-04 MED ORDER — LOSARTAN POTASSIUM 100 MG PO TABS: 100.0000 mg | ORAL_TABLET | Freq: Every day | ORAL | 3 refills | Status: DC

## 2022-05-04 MED ORDER — "SYRINGE/NEEDLE (DISP) 18G X 1"" 3 ML MISC": 6 refills | Status: DC

## 2022-05-04 MED ORDER — MONTELUKAST SODIUM 10 MG PO TABS: 10.0000 mg | ORAL_TABLET | Freq: Every day | ORAL | 3 refills | Status: DC

## 2022-05-04 MED ORDER — SIMVASTATIN 40 MG PO TABS: 40.0000 mg | ORAL_TABLET | Freq: Every day | ORAL | 3 refills | Status: DC

## 2022-05-04 NOTE — Progress Notes (Signed)
BP 130/71   Pulse 66   Temp (!) 97.4 F (36.3 C)   Ht _0  (1.702 m)   Wt 244 lb (110.7 kg)   SpO2 96%   BMI 38.22 kg/m    Subjective:   Patient ID: Steve Williams, male    DOB: 1955-09-23, 66 y.o.   MRN: [id number]  HPI: Steve Williams is a 66 y.o. male presenting on 05/04/2022 for Medical Management of Chronic Issues, Hyperlipidemia, Hypertension, and Diabetes   HPI Type 2 diabetes mellitus Patient comes in today for recheck of his diabetes. Patient has been currently taking diet control. Patient is currently on an ACE inhibitor/ARB. Patient has seen an ophthalmologist this year. Patient denies any issues with their feet. The symptom started onset as an adult hypertension and hyperlipidemia ARE RELATED TO DM   Hypertension Patient is currently on losartan hydrochlorothiazide, and their blood pressure today is 130/71. Patient denies any lightheadedness or dizziness. Patient denies headaches, blurred vision, chest pains, shortness of breath, or weakness. Denies any side effects from medication and is content with current medication.   Hyperlipidemia Patient is coming in for recheck of his hyperlipidemia. The patient is currently taking simvastatin. They deny any issues with myalgias or history of liver damage from it. They deny any focal numbness or weakness or chest pain.   Patient has bilateral knee osteoarthritis and has seen orthopedics for flex agenic's injections earlier this year and has had steroid injections in the past.  They have helped 20 to but he started to have pain again over the past few weeks.  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 Interim medical history since our last visit reviewed. Allergies and medications reviewed and updated.  Review of Systems  Constitutional:  Negative for chills and fever.  Respiratory:  Negative for  shortness of breath and wheezing.   Cardiovascular:  Negative for chest pain and leg swelling.  Musculoskeletal:  Positive for arthralgias and gait problem. Negative for back pain.  Skin:  Negative for rash.  All other systems reviewed and are negative.   Per HPI unless specifically indicated above   Allergies as of 05/04/2022       Reactions   Lisinopril    Erythromycin Rash        Medication List        Accurate as of May 04, 2022  8:50 AM. If you have any questions, ask your nurse or doctor.          STOP taking these medications    amoxicillin-clavulanate 875-125 MG tablet Commonly known as: AUGMENTIN Stopped by: Fransisca Kaufmann Alline Pio, MD       TAKE these medications    aspirin 81 MG tablet Take 81 mg by mouth daily.   B-D 3CC LUER-LOK SYR 22GX1" 22G X 1" 3 ML Misc Generic drug: SYRINGE-NEEDLE (DISP) 3 ML USE TO GIVE TESTOSTERONE   Syringe/Needle (Disp) 18G X 1" 3 ML Misc Use as directed Use to draw up testosterone   BD Disp Needles 18G X 1-1/2" Misc Generic drug: NEEDLE (DISP) 18 G USE TO DRAW UP TESTOSTERONE   fluticasone 50 MCG/ACT nasal spray Commonly known as: FLONASE Place into both nostrils daily.   hydrochlorothiazide 25 MG tablet Commonly known as: HYDRODIURIL Take 1 tablet (25 mg total) by mouth daily.   loratadine 10 MG tablet Commonly known as: CLARITIN  Take 10 mg by mouth daily.   losartan 100 MG tablet Commonly known as: COZAAR Take 1 tablet (100 mg total) by mouth daily.   meloxicam 15 MG tablet Commonly known as: MOBIC Take 1 tablet (15 mg total) by mouth daily.   montelukast 10 MG tablet Commonly known as: SINGULAIR Take 1 tablet (10 mg total) by mouth at bedtime.   sertraline 100 MG tablet Commonly known as: ZOLOFT Take 1 tablet (100 mg total) by mouth daily.   sildenafil 100 MG tablet Commonly known as: VIAGRA Take 1 tablet (100 mg total) by mouth daily as needed for erectile dysfunction.   sildenafil 20 MG  tablet Commonly known as: REVATIO take ONE TO FIVE TABLETS BY MOUTH EVERY DAY AS NEEDED   simvastatin 40 MG tablet Commonly known as: ZOCOR Take 1 tablet (40 mg total) by mouth daily.   testosterone cypionate 200 MG/ML injection Commonly known as: DEPOTESTOSTERONE CYPIONATE INJECT 0.5 ML INTRAMUSCULARLY EVERY 7 DAYS   tobramycin-dexamethasone ophthalmic solution Commonly known as: TobraDex Apply 1 drop in affected eye(s) every 2 hours for two days. Then every 4 hours for 5 days.         Objective:   BP 130/71   Pulse 66   Temp (!) 97.4 F (36.3 C)   Ht _0  (1.702 m)   Wt 244 lb (110.7 kg)   SpO2 96%   BMI 38.22 kg/m   Wt Readings from Last 3 Encounters:  05/04/22 244 lb (110.7 kg)  03/30/22 243 lb 6.4 oz (110.4 kg)  12/19/21 250 lb (113.4 kg)    Physical Exam Vitals and nursing note reviewed.  Constitutional:      General: He is not in acute distress.    Appearance: He is well-developed. He is not diaphoretic.  Eyes:     General: No scleral icterus.    Conjunctiva/sclera: Conjunctivae normal.  Neck:     Thyroid: No thyromegaly.  Cardiovascular:     Rate and Rhythm: Normal rate and regular rhythm.     Heart sounds: Normal heart sounds. No murmur heard. Pulmonary:     Effort: Pulmonary effort is normal. No respiratory distress.     Breath sounds: Normal breath sounds. No wheezing.  Musculoskeletal:        General: No swelling or tenderness. Normal range of motion.     Cervical back: Neck supple.  Lymphadenopathy:     Cervical: No cervical adenopathy.  Skin:    General: Skin is warm and dry.     Findings: No rash.  Neurological:     Mental Status: He is alert and oriented to person, place, and time.     Coordination: Coordination normal.  Psychiatric:        Behavior: Behavior normal.       Assessment & Plan:   Problem List Items Addressed This Visit       Cardiovascular and Mediastinum   Hypertension associated with diabetes ([place])    Relevant Medications   hydrochlorothiazide (HYDRODIURIL) 25 MG tablet   losartan (COZAAR) 100 MG tablet   sildenafil (VIAGRA) 100 MG tablet   simvastatin (ZOCOR) 40 MG tablet   Other Relevant Orders   CBC with Differential/Platelet   CMP14+EGFR   Lipid panel   Bayer DCA Hb A1c Waived     Respiratory   Allergic rhinitis   Relevant Medications   montelukast (SINGULAIR) 10 MG tablet     Endocrine   Hyperlipidemia associated with type 2 diabetes mellitus ([place]) - Primary  Relevant Medications   hydrochlorothiazide (HYDRODIURIL) 25 MG tablet   losartan (COZAAR) 100 MG tablet   sildenafil (VIAGRA) 100 MG tablet   simvastatin (ZOCOR) 40 MG tablet   Other Relevant Orders   CBC with Differential/Platelet   CMP14+EGFR   Lipid panel   Bayer DCA Hb A1c Waived   Hypogonadism in male   Relevant Medications   B-D 3CC LUER-LOK SYR 22GX1" 22G X 1" 3 ML MISC   Syringe/Needle, Disp, 18G X 1" 3 ML MISC     Other   Mood disorder ([facility])   Relevant Medications   sertraline (ZOLOFT) 100 MG tablet   Other Visit Diagnoses     Type 2 diabetes mellitus with other specified complication, without long-term current use of insulin ([facility])       Relevant Medications   hydrochlorothiazide (HYDRODIURIL) 25 MG tablet   losartan (COZAAR) 100 MG tablet   simvastatin (ZOCOR) 40 MG tablet   Other Relevant Orders   CBC with Differential/Platelet   CMP14+EGFR   Lipid panel   Bayer DCA Hb A1c Waived   Bilateral primary osteoarthritis of knee       Relevant Medications   meloxicam (MOBIC) 15 MG tablet       A1c looks great at 5.9.  He was consider knee injections but opted not to today.  He is going to try for the flex agenic's again. Follow up plan: Return in about 6 months (around 11/03/2022), or if symptoms worsen or fail to improve, for Diabetes hypertension cholesterol.  Counseling provided for all of the vaccine components Orders Placed This Encounter  Procedures   CBC with Differential/Platelet    CMP14+EGFR   Lipid panel   Bayer DCA Hb A1c Waived    Caryl Pina, MD [facility] 05/04/2022, 8:50 AM

## 2022-05-05 LAB — LIPID PANEL
Chol/HDL Ratio: 3.2 ratio (ref 0.0–5.0)
Cholesterol, Total: 118 mg/dL (ref 100–199)
HDL: 37 mg/dL — ABNORMAL LOW (ref >39)
LDL Chol Calc (NIH): 59 mg/dL (ref 0–99)
Triglycerides: 125 mg/dL (ref 0–149)
VLDL Cholesterol Cal: 22 mg/dL (ref 5–40)

## 2022-05-05 LAB — CBC WITH DIFFERENTIAL/PLATELET
Basophils Absolute: 0.1 10*3/uL (ref 0.0–0.2)
Basos: 1 %
EOS (ABSOLUTE): 0.1 10*3/uL (ref 0.0–0.4)
Eos: 1 %
Hematocrit: 48.4 % (ref 37.5–51.0)
Hemoglobin: 16.8 g/dL (ref 13.0–17.7)
Immature Grans (Abs): 0.1 10*3/uL (ref 0.0–0.1)
Immature Granulocytes: 1 %
Lymphocytes Absolute: 1.3 10*3/uL (ref 0.7–3.1)
Lymphs: 20 %
MCH: 30.6 pg (ref 26.6–33.0)
MCHC: 34.7 g/dL (ref 31.5–35.7)
MCV: 88 fL (ref 79–97)
Monocytes Absolute: 0.5 10*3/uL (ref 0.1–0.9)
Monocytes: 8 %
Neutrophils Absolute: 4.3 10*3/uL (ref 1.4–7.0)
Neutrophils: 69 %
Platelets: 112 10*3/uL — ABNORMAL LOW (ref 150–450)
RBC: 5.49 x10E6/uL (ref 4.14–5.80)
RDW: 13 % (ref 11.6–15.4)
WBC: 6.3 10*3/uL (ref 3.4–10.8)

## 2022-05-05 LAB — CMP14+EGFR
ALT: 22 IU/L (ref 0–44)
AST: 18 IU/L (ref 0–40)
Albumin/Globulin Ratio: 2.4 — ABNORMAL HIGH (ref 1.2–2.2)
Albumin: 4.4 g/dL (ref 3.9–4.9)
Alkaline Phosphatase: 82 IU/L (ref 44–121)
BUN/Creatinine Ratio: 16 (ref 10–24)
BUN: 14 mg/dL (ref 8–27)
Bilirubin Total: 0.6 mg/dL (ref 0.0–1.2)
CO2: 22 mmol/L (ref 20–29)
Calcium: 9 mg/dL (ref 8.6–10.2)
Chloride: 102 mmol/L (ref 96–106)
Creatinine, Ser: 0.85 mg/dL (ref 0.76–1.27)
Globulin, Total: 1.8 g/dL (ref 1.5–4.5)
Glucose: 120 mg/dL — ABNORMAL HIGH (ref 70–99)
Potassium: 4.1 mmol/L (ref 3.5–5.2)
Sodium: 141 mmol/L (ref 134–144)
Total Protein: 6.2 g/dL (ref 6.0–8.5)
eGFR: 95 mL/min/{1.73_m2} (ref >59)

## 2022-05-22 ENCOUNTER — Ambulatory Visit (INDEPENDENT_AMBULATORY_CARE_PROVIDER_SITE_OTHER): Payer: Medicare Other

## 2022-05-22 ENCOUNTER — Ambulatory Visit (INDEPENDENT_AMBULATORY_CARE_PROVIDER_SITE_OTHER): Payer: Medicare Other | Admitting: Family Medicine

## 2022-05-22 ENCOUNTER — Encounter: Payer: Self-pay | Admitting: Family Medicine

## 2022-05-22 VITALS — BP 132/76 | HR 79 | Temp 97.3°F | Ht 67.0 in | Wt 243.6 lb

## 2022-05-22 DIAGNOSIS — M170 Bilateral primary osteoarthritis of knee: Secondary | ICD-10-CM | POA: Diagnosis not present

## 2022-05-22 DIAGNOSIS — G8929 Other chronic pain: Secondary | ICD-10-CM

## 2022-05-22 DIAGNOSIS — M25562 Pain in left knee: Secondary | ICD-10-CM

## 2022-05-22 DIAGNOSIS — M25561 Pain in right knee: Secondary | ICD-10-CM | POA: Diagnosis not present

## 2022-05-22 MED ORDER — METHYLPREDNISOLONE ACETATE 80 MG/ML IJ SUSP
80.0000 mg | Freq: Once | INTRAMUSCULAR | Status: AC
  Administered 2022-05-22: 80 mg via INTRA_ARTICULAR

## 2022-05-22 NOTE — Progress Notes (Signed)
BP 132/76   Pulse 79   Temp (!) 97.3 F (36.3 C) (Temporal)   Ht '5\' 7"'$  (1.702 m)   Wt 243 lb 9.6 oz (110.5 kg)   SpO2 99%   BMI 38.15 kg/m    Subjective:   Patient ID: Steve Williams, male    DOB: 01-26-1956, 66 y.o.   MRN: [id number]  HPI: Steve Williams is a 66 y.o. male presenting on 05/22/2022 for Knee Pain (Bilateral ongoing keen pain. )   HPI Patient is coming in today for bilateral knee pain.  He has arthritis in both knees and has had it for some time.  He is coming in because he wants knee injections today.  He has had them previously and then most recently did flex agenic's and was thinking about doing that again but opted to go with these injections again and see how it does for him.  Relevant past medical, surgical, family and social history reviewed and updated as indicated. Interim medical history since our last visit reviewed. Allergies and medications reviewed and updated.  Review of Systems  Constitutional:  Negative for chills and fever.  Respiratory:  Negative for shortness of breath and wheezing.   Cardiovascular:  Negative for chest pain and leg swelling.  Musculoskeletal:  Positive for arthralgias, gait problem and joint swelling. Negative for back pain.  Skin:  Negative for rash.  All other systems reviewed and are negative.   Per HPI unless specifically indicated above   Allergies as of 05/22/2022       Reactions   Lisinopril    Erythromycin Rash        Medication List        Accurate as of May 22, 2022  9:56 AM. If you have any questions, ask your nurse or doctor.          aspirin 81 MG tablet Take 81 mg by mouth daily.   B-D 3CC LUER-LOK SYR 22GX1" 22G X 1" 3 ML Misc Generic drug: SYRINGE-NEEDLE (DISP) 3 ML USE TO GIVE TESTOSTERONE   Syringe/Needle (Disp) 18G X 1" 3 ML Misc Use as directed Use to draw up testosterone   BD Disp Needles 18G X 1-1/2" Misc Generic drug: NEEDLE (DISP) 18 G USE TO DRAW UP TESTOSTERONE    fluticasone 50 MCG/ACT nasal spray Commonly known as: FLONASE Place into both nostrils daily.   hydrochlorothiazide 25 MG tablet Commonly known as: HYDRODIURIL Take 1 tablet (25 mg total) by mouth daily.   loratadine 10 MG tablet Commonly known as: CLARITIN Take 10 mg by mouth daily.   losartan 100 MG tablet Commonly known as: COZAAR Take 1 tablet (100 mg total) by mouth daily.   meloxicam 15 MG tablet Commonly known as: MOBIC Take 1 tablet (15 mg total) by mouth daily.   montelukast 10 MG tablet Commonly known as: SINGULAIR Take 1 tablet (10 mg total) by mouth at bedtime.   sertraline 100 MG tablet Commonly known as: ZOLOFT Take 1 tablet (100 mg total) by mouth daily.   sildenafil 100 MG tablet Commonly known as: VIAGRA Take 1 tablet (100 mg total) by mouth daily as needed for erectile dysfunction.   sildenafil 20 MG tablet Commonly known as: REVATIO take ONE TO FIVE TABLETS BY MOUTH EVERY DAY AS NEEDED   simvastatin 40 MG tablet Commonly known as: ZOCOR Take 1 tablet (40 mg total) by mouth daily.   testosterone cypionate 200 MG/ML injection Commonly known as: DEPOTESTOSTERONE CYPIONATE INJECT 0.5 ML INTRAMUSCULARLY  EVERY 7 DAYS   tobramycin-dexamethasone ophthalmic solution Commonly known as: TobraDex Apply 1 drop in affected eye(s) every 2 hours for two days. Then every 4 hours for 5 days.         Objective:   BP 132/76   Pulse 79   Temp (!) 97.3 F (36.3 C) (Temporal)   Ht '5\' 7"'$  (1.702 m)   Wt 243 lb 9.6 oz (110.5 kg)   SpO2 99%   BMI 38.15 kg/m   Wt Readings from Last 3 Encounters:  05/22/22 243 lb 9.6 oz (110.5 kg)  05/04/22 244 lb (110.7 kg)  03/30/22 243 lb 6.4 oz (110.4 kg)    Physical Exam Vitals and nursing note reviewed.  Constitutional:      Appearance: Normal appearance.  Musculoskeletal:     Right knee: No effusion. Normal range of motion. Tenderness present over the medial joint line. No LCL laxity, MCL laxity, ACL laxity or  PCL laxity. Normal meniscus and normal patellar mobility.     Left knee: No effusion. Normal range of motion. Tenderness present over the lateral joint line. No LCL laxity, MCL laxity, ACL laxity or PCL laxity.Abnormal patellar mobility. Normal meniscus.  Neurological:     Mental Status: He is alert.     Bilateral knee x-ray: Bilateral tricompartmental narrowing, worse on the left lateral compartment and right medial compartment  Knee bilateral injection: Consent form signed. Risk factors of bleeding and infection discussed with patient and patient is agreeable towards injection. Patient prepped with Betadine. Lateral approach towards injection used. Injected 80 mg of Depo-Medrol and 1 mL of 2% lidocaine. Patient tolerated procedure well and no side effects from noted. Minimal to no bleeding. Simple bandage applied after.   Assessment & Plan:   Problem List Items Addressed This Visit   None Visit Diagnoses     Chronic pain of both knees    -  Primary   Relevant Medications   methylPREDNISolone acetate (DEPO-MEDROL) injection 80 mg (Completed)   methylPREDNISolone acetate (DEPO-MEDROL) injection 80 mg (Completed)   Other Relevant Orders   DG Knee 1-2 Views Right   DG Knee 1-2 Views Left     Gave both knee injections and we will see how they do for him, recommended at least 4 months before repeating  Follow up plan: Return if symptoms worsen or fail to improve.  Counseling provided for all of the vaccine components Orders Placed This Encounter  Procedures   DG Knee 1-2 Views Right   DG Knee 1-2 Views Left    Caryl Pina, MD [facility] 05/22/2022, 9:56 AM

## 2022-06-05 NOTE — Progress Notes (Signed)
History of Present Illness:   He is on 100 mg of testosterone cypionate, administered every Sunday.  He is having difficulty getting his prescription refilled at times.  He is having worsening problems with erections.  He takes 100 mg of sildenafil at a time rarely.  He does not want to consider intracorporeal injections.  He was given OAB guide sheet last year but he still has OAB symptoms   Past Medical History:  Diagnosis Date   Allergy    Anxiety    Arthritis    Asthma    Cancer ([place])    Depression    Diabetes mellitus without complication ([place])    Hyperlipidemia    Hypertension     Past Surgical History:  Procedure Laterality Date   CARPAL BONE EXCISION Bilateral    KNEE ARTHROSCOPY Left    KNEE SURGERY Left 1974   lipoma removed     TRIGGER FINGER RELEASE Right     Home Medications:  Allergies as of 06/06/2022       Reactions   Lisinopril    Erythromycin Rash        Medication List        Accurate as of June 05, 2022  7:26 PM. If you have any questions, ask your nurse or doctor.          aspirin 81 MG tablet Take 81 mg by mouth daily.   B-D 3CC LUER-LOK SYR 22GX1" 22G X 1" 3 ML Misc Generic drug: SYRINGE-NEEDLE (DISP) 3 ML USE TO GIVE TESTOSTERONE   Syringe/Needle (Disp) 18G X 1" 3 ML Misc Use as directed Use to draw up testosterone   BD Disp Needles 18G X 1-1/2" Misc Generic drug: NEEDLE (DISP) 18 G USE TO DRAW UP TESTOSTERONE   fluticasone 50 MCG/ACT nasal spray Commonly known as: FLONASE Place into both nostrils daily.   hydrochlorothiazide 25 MG tablet Commonly known as: HYDRODIURIL Take 1 tablet (25 mg total) by mouth daily.   loratadine 10 MG tablet Commonly known as: CLARITIN Take 10 mg by mouth daily.   losartan 100 MG tablet Commonly known as: COZAAR Take 1 tablet (100 mg total) by mouth daily.   meloxicam 15 MG tablet Commonly known as: MOBIC Take 1 tablet (15 mg total) by mouth daily.   montelukast 10 MG  tablet Commonly known as: SINGULAIR Take 1 tablet (10 mg total) by mouth at bedtime.   sertraline 100 MG tablet Commonly known as: ZOLOFT Take 1 tablet (100 mg total) by mouth daily.   sildenafil 100 MG tablet Commonly known as: VIAGRA Take 1 tablet (100 mg total) by mouth daily as needed for erectile dysfunction.   sildenafil 20 MG tablet Commonly known as: REVATIO take ONE TO FIVE TABLETS BY MOUTH EVERY DAY AS NEEDED   simvastatin 40 MG tablet Commonly known as: ZOCOR Take 1 tablet (40 mg total) by mouth daily.   testosterone cypionate 200 MG/ML injection Commonly known as: DEPOTESTOSTERONE CYPIONATE INJECT 0.5 ML INTRAMUSCULARLY EVERY 7 DAYS   tobramycin-dexamethasone ophthalmic solution Commonly known as: TobraDex Apply 1 drop in affected eye(s) every 2 hours for two days. Then every 4 hours for 5 days.        Allergies:  Allergies  Allergen Reactions   Lisinopril    Erythromycin Rash    Family History  Problem Relation Age of Onset   Heart disease Mother    Hypertension Mother    Diabetes Mother    Heart disease Father    Cancer Father  liver   Diabetes Father    Hypertension Sister    Diabetes Sister    Migraines Son     Social History:  reports that he has never smoked. He has never used smokeless tobacco. He reports that he does not drink alcohol and does not use drugs.  ROS: A complete review of systems was performed.  All systems are negative except for pertinent findings as noted.  Physical Exam:  Vital signs in last 24 hours: There were no vitals taken for this visit. Constitutional:  Alert and oriented, No acute distress Cardiovascular: Regular rate  Respiratory: Normal respiratory effort Genitourinary: Phallus is uncircumcised.  Testicles atrophic.  No inguinal hernias. Lymphatic: No lymphadenopathy Neurologic: Grossly intact, no focal deficits Psychiatric: Normal mood and affect  I have reviewed prior pt notes  I have  reviewed urinalysis results  He has not had recent testosterone or PSA levels done  I have reviewed prior PSA results     Impression/Assessment:  1.  Hypogonadism on 100 mg of testosterone cypionate weekly.  This treats his symptoms fairly well  2.  ED, on 100 mg of sildenafil with mixed results  3.  Overactive bladder symptoms  Plan:  1.  I will refill sildenafil at [facility] for 100 mg, 30 tablets at a time  2.  Testosterone refilled  3.  Appropriate labs drawn today-this will be a peak level of testosterone  4.  I will see back in a year  5.  He was given samples of Gemtesa for overactive bladder symptoms

## 2022-06-06 ENCOUNTER — Ambulatory Visit: Payer: Medicare Other | Admitting: Urology

## 2022-06-06 VITALS — BP 151/72 | HR 64 | Wt 244.0 lb

## 2022-06-06 DIAGNOSIS — N5201 Erectile dysfunction due to arterial insufficiency: Secondary | ICD-10-CM

## 2022-06-06 DIAGNOSIS — R399 Unspecified symptoms and signs involving the genitourinary system: Secondary | ICD-10-CM

## 2022-06-06 DIAGNOSIS — E291 Testicular hypofunction: Secondary | ICD-10-CM

## 2022-06-06 MED ORDER — SILDENAFIL CITRATE 100 MG PO TABS: 100.0000 mg | ORAL_TABLET | Freq: Every day | ORAL | 99 refills | Status: DC | PRN

## 2022-06-06 MED ORDER — TESTOSTERONE CYPIONATE 200 MG/ML IM SOLN: INTRAMUSCULAR | 3 refills | Status: DC

## 2022-06-06 MED ORDER — "BD DISP NEEDLES 18G X 1-1/2"" MISC": 6 refills | Status: DC

## 2022-06-07 LAB — URINALYSIS, ROUTINE W REFLEX MICROSCOPIC
Bilirubin, UA: NEGATIVE
Glucose, UA: NEGATIVE
Ketones, UA: NEGATIVE
Leukocytes,UA: NEGATIVE
Nitrite, UA: NEGATIVE
RBC, UA: NEGATIVE
Specific Gravity, UA: 1.015 (ref 1.005–1.030)
Urobilinogen, Ur: 1 mg/dL (ref 0.2–1.0)
pH, UA: 7 (ref 5.0–7.5)

## 2022-06-07 LAB — TESTOSTERONE: Testosterone: 508 ng/dL (ref 264–916)

## 2022-06-07 LAB — PSA: Prostate Specific Ag, Serum: 2.9 ng/mL (ref 0.0–4.0)

## 2022-06-08 ENCOUNTER — Encounter: Payer: Self-pay | Admitting: Urology

## 2022-06-13 ENCOUNTER — Encounter: Payer: Self-pay | Admitting: Urology

## 2022-06-16 NOTE — Telephone Encounter (Signed)
Please see pt message below and advise.

## 2022-06-20 DIAGNOSIS — M25562 Pain in left knee: Secondary | ICD-10-CM | POA: Diagnosis not present

## 2022-06-20 DIAGNOSIS — M25561 Pain in right knee: Secondary | ICD-10-CM | POA: Diagnosis not present

## 2022-06-20 DIAGNOSIS — M170 Bilateral primary osteoarthritis of knee: Secondary | ICD-10-CM | POA: Diagnosis not present

## 2022-07-07 NOTE — Progress Notes (Signed)
[facility] Quality Team Note  Name: ANANTH FIALLOS Date of Birth: [birth year] MRN: [id number] Date: 07/07/2022  [facility] Quality Team has reviewed this patient's chart, please see recommendations below:  [facility] Quality Other; KED: Kidney Health Evaluation Gap- Patient needs Urine Albumin Creatinine Ratio Test completed for gap closure. EGFR has already been completed.

## 2022-07-14 ENCOUNTER — Telehealth: Payer: Self-pay | Admitting: Family Medicine

## 2022-07-14 ENCOUNTER — Other Ambulatory Visit: Payer: Self-pay | Admitting: Family Medicine

## 2022-07-14 DIAGNOSIS — E1169 Type 2 diabetes mellitus with other specified complication: Secondary | ICD-10-CM

## 2022-07-14 NOTE — Telephone Encounter (Signed)
-----   Message from [place] sent at 07/07/2022  1:56 PM EST ----- Please review [facility] Quality team note.

## 2022-07-16 ENCOUNTER — Other Ambulatory Visit: Payer: Self-pay | Admitting: Urology

## 2022-07-16 DIAGNOSIS — E291 Testicular hypofunction: Secondary | ICD-10-CM

## 2022-08-12 ENCOUNTER — Ambulatory Visit
Admission: EM | Admit: 2022-08-12 | Discharge: 2022-08-12 | Disposition: A | Discharge status: Home / Self Care | Payer: Medicare Other | Attending: Family Medicine | Admitting: Family Medicine

## 2022-08-12 DIAGNOSIS — S46912A Strain of unspecified muscle, fascia and tendon at shoulder and upper arm level, left arm, initial encounter: Secondary | ICD-10-CM | POA: Diagnosis not present

## 2022-08-12 MED ORDER — DEXAMETHASONE SODIUM PHOSPHATE 10 MG/ML IJ SOLN
10.0000 mg | Freq: Once | INTRAMUSCULAR | Status: AC
  Administered 2022-08-12: 10 mg via INTRAMUSCULAR

## 2022-08-12 NOTE — ED Provider Notes (Signed)
[facility]    CSN: [id number] Arrival date & time: 08/12/22  1429      History   Chief Complaint Chief Complaint  Patient presents with   Shoulder Injury    HPI WAYNE WICKLUND is a 67 y.o. male.   Patient presenting today with 3-day history of diffuse left shoulder pain extending down toward the left tricep since lifting a riding mower into a vehicle.  Denies radiation of pain down the arm, loss of range of motion, numbness, tingling, weakness, bruising, swelling.  Pain is significantly worse with movement of the shoulder.  Taking Tylenol and using heating pads with mild temporary relief of symptoms.  Also notes that he takes meloxicam daily for arthritis.    Past Medical History:  Diagnosis Date   Allergy    Anxiety    Arthritis    Asthma    Cancer ([place])    Depression    Diabetes mellitus without complication ([place])    Hyperlipidemia    Hypertension     Patient Active Problem List   Diagnosis Date Noted   Morbid obesity ([place]) 02/27/2018   OSA on CPAP 01/27/2016   History of melanoma 07/13/2015   Hypertension associated with diabetes ([place]) 06/21/2015   Hypogonadism in male 06/21/2015   Allergic rhinitis 06/21/2015   Hyperlipidemia associated with type 2 diabetes mellitus ([place]) 06/21/2015   Mood disorder ([place]) 06/21/2015    Past Surgical History:  Procedure Laterality Date   CARPAL BONE EXCISION Bilateral    KNEE ARTHROSCOPY Left    KNEE SURGERY Left 1974   lipoma removed     TRIGGER FINGER RELEASE Right        Home Medications    Prior to Admission medications   Medication Sig Start Date End Date Taking? Authorizing Provider  aspirin 81 MG tablet Take 81 mg by mouth daily.   Yes [provider]  B-D 3CC LUER-LOK SYR 22GX1" 22G X 1" 3 ML MISC USE TO GIVE TESTOSTERONE 05/04/22  Yes Dettinger, Fransisca Kaufmann, MD  BD DISP NEEDLES 18G X 1-1/2" MISC USE TO draw UP testosterone 07/18/22  Yes Dahlstedt, Annie Main, MD  fluticasone (FLONASE) 50  MCG/ACT nasal spray Place into both nostrils daily.   Yes [provider]  hydrochlorothiazide (HYDRODIURIL) 25 MG tablet Take 1 tablet (25 mg total) by mouth daily. 05/04/22  Yes Dettinger, Fransisca Kaufmann, MD  loratadine (CLARITIN) 10 MG tablet Take 10 mg by mouth daily.   Yes [provider]  losartan (COZAAR) 100 MG tablet Take 1 tablet (100 mg total) by mouth daily. 05/04/22  Yes Dettinger, Fransisca Kaufmann, MD  meloxicam (MOBIC) 15 MG tablet Take 1 tablet (15 mg total) by mouth daily. 05/04/22  Yes Dettinger, Fransisca Kaufmann, MD  montelukast (SINGULAIR) 10 MG tablet Take 1 tablet (10 mg total) by mouth at bedtime. 05/04/22  Yes Dettinger, Fransisca Kaufmann, MD  sertraline (ZOLOFT) 100 MG tablet Take 1 tablet (100 mg total) by mouth daily. 05/04/22  Yes Dettinger, Fransisca Kaufmann, MD  sildenafil (REVATIO) 20 MG tablet take ONE TO FIVE TABLETS BY MOUTH EVERY DAY AS NEEDED 03/02/22  Yes Dettinger, Fransisca Kaufmann, MD  sildenafil (VIAGRA) 100 MG tablet Take 1 tablet (100 mg total) by mouth daily as needed for erectile dysfunction. 06/06/22  Yes Dahlstedt, Annie Main, MD  simvastatin (ZOCOR) 40 MG tablet Take 1 tablet (40 mg total) by mouth daily. 05/04/22  Yes Dettinger, Fransisca Kaufmann, MD  Syringe/Needle, Disp, 18G X 1" 3 ML MISC Use as directed  Use to draw up testosterone 05/04/22  Yes Dettinger, Fransisca Kaufmann, MD  testosterone cypionate (DEPOTESTOSTERONE CYPIONATE) 200 MG/ML injection INJECT 0.5 ML INTRAMUSCULARLY EVERY 7 DAYS 06/06/22  Yes Dahlstedt, Annie Main, MD  tobramycin-dexamethasone [facility]) ophthalmic solution Apply 1 drop in affected eye(s) every 2 hours for two days. Then every 4 hours for 5 days. 03/30/22   Claretta Fraise, MD    Family History Family History  Problem Relation Age of Onset   Heart disease Mother    Hypertension Mother    Diabetes Mother    Heart disease Father    Cancer Father        liver   Diabetes Father    Hypertension Sister    Diabetes Sister    Migraines Son     Social History Social History    Tobacco Use   Smoking status: Never   Smokeless tobacco: Never  Substance Use Topics   Alcohol use: No   Drug use: No     Allergies   Lisinopril and Erythromycin   Review of Systems Review of Systems Per HPI  Physical Exam Triage Vital Signs ED Triage Vitals [08/12/22 1448]  Enc Vitals Group     BP (!) 150/76     Pulse Rate 81     Resp 16     Temp 98.1 F (36.7 C)     Temp Source Oral     SpO2 94 %     Weight      Height      Head Circumference      Peak Flow      Pain Score 5     Pain Loc      Pain Edu?      Excl. in [place]?    No data found.  Updated Vital Signs BP (!) 150/76 (BP Location: Right Arm)   Pulse 81   Temp 98.1 F (36.7 C) (Oral)   Resp 16   SpO2 94%   Visual Acuity Right Eye Distance:   Left Eye Distance:   Bilateral Distance:    Right Eye Near:   Left Eye Near:    Bilateral Near:     Physical Exam Vitals and nursing note reviewed.  Constitutional:      Appearance: Normal appearance.  HENT:     Head: Atraumatic.  Eyes:     Extraocular Movements: Extraocular movements intact.     Conjunctiva/sclera: Conjunctivae normal.  Cardiovascular:     Rate and Rhythm: Normal rate and regular rhythm.  Pulmonary:     Effort: Pulmonary effort is normal.     Breath sounds: Normal breath sounds.  Musculoskeletal:        General: Tenderness and signs of injury present. No swelling or deformity. Normal range of motion.     Cervical back: Normal range of motion and neck supple.     Comments: Range of motion intact but painful with extension above 90 degrees.  Grip strength full and equal bilateral hands.  Mild tenderness to palpation over left tricep and diffusely across anterior and lateral shoulder  Skin:    General: Skin is warm and dry.     Findings: No bruising or erythema.  Neurological:     General: No focal deficit present.     Mental Status: He is oriented to person, place, and time.  Psychiatric:        Mood and Affect: Mood  normal.        Thought Content: Thought content normal.  Judgment: Judgment normal.      UC Treatments / Results  Labs (all labs ordered are listed, but only abnormal results are displayed) Labs Reviewed - No data to display  EKG   Radiology No results found.  Procedures Procedures (including critical care time)  Medications Ordered in UC Medications  dexamethasone (DECADRON) injection 10 mg (10 mg Intramuscular Given 08/12/22 1532)    Initial Impression / Assessment and Plan / UC Course  I have reviewed the triage vital signs and the nursing notes.  Pertinent labs & imaging results that were available during my care of the patient were reviewed by me and considered in my medical decision making (see chart for details).     Given lack of improvement with Tylenol, meloxicam will treat with IM Decadron with careful monitoring of blood sugars, heat, massage, gentle stretches and range of motion exercises.  Suspect muscular strain causing symptoms.  Return for worsening symptoms.  Final Clinical Impressions(s) / UC Diagnoses   Final diagnoses:  Strain of left shoulder, initial encounter   Discharge Instructions   None    ED Prescriptions   None    PDMP not reviewed this encounter.   Volney American, [place] 08/12/22 1545

## 2022-08-12 NOTE — ED Triage Notes (Addendum)
Patient stated he hurt his left shoulder 3 days ago trying to lift riding mower into vehicle. Now having difficulty and pain lifting his shoulder.

## 2022-08-21 ENCOUNTER — Ambulatory Visit (INDEPENDENT_AMBULATORY_CARE_PROVIDER_SITE_OTHER): Payer: Medicare Other | Admitting: Family Medicine

## 2022-08-21 ENCOUNTER — Encounter: Payer: Self-pay | Admitting: Family Medicine

## 2022-08-21 VITALS — BP 134/77 | HR 90 | Temp 98.3°F | Ht 67.0 in | Wt 242.0 lb

## 2022-08-21 DIAGNOSIS — M25512 Pain in left shoulder: Secondary | ICD-10-CM | POA: Diagnosis not present

## 2022-08-21 NOTE — Patient Instructions (Signed)
Rehabilitation during recovery from Type I and II injuries -- Rehabilitation consists of basic motion and strengthening exercises for the shoulder and scapular stabilizers. For Type II and Type III injuries, the risk of reinjury is increased until the ligaments have fully remodeled, which may require 6 to 10 weeks. Therefore, any exercises performed prior to complete healing must be performed cautiously. Mechanical support of the joint (eg, taping (picture 5)) may be useful for some patients during rehabilitation and when resuming activity.    A basic scheme for the rehabilitation of uncomplicated Type I and Type II injuries is provided below [13]. Many rehabilitation exercises for the shoulder are described in detail separately. (See "Rehabilitation principles and practice for shoulder impingement and related problems".)  ?Weeks 1 to 3: Patients perform only gentle range of motion (ROM) exercises (eg, pendulum swings without weight (picture 6)) to prevent a loss of shoulder mobility, while allowing inflammation and pain to subside. These exercises can be performed several times per day. No exercise that places strain on the healing ligaments is performed. Shoulder motions to be avoided include cross-body adduction, extreme internal rotation (ie, behind the back), and overhead movements.  ?Weeks 2 to 6: Patients begin a limited strength program designed to prevent muscle atrophy and maintain basic strength as soon as the exercises can be tolerated. Exercises that place undue strain on the [facility] joint and associated ligaments are avoided. Exercises may include scapular protraction and retraction against a wall, isolated internal (picture 7) and external (picture 8) shoulder rotation using light resistance performed in the neutral position (shoulder adducted), and limited forward shoulder flexion with arm adducted using light resistance. For all exercises, avoid extremes of motion. Note that these exercises are  only introduced if the patient can perform them without significant pain. The exercises can be performed three times per day, two to three sets of 10 to 15 repetitions per exercise during each session is reasonable.  ?Weeks 6 to 8: Patients begin to perform more challenging strength exercises. Again, these exercises are only introduced if they do not provoke significant pain. Exercises often include controlled rowing (picture 9) and scapular stabilization (picture 10 and picture 11 and picture 12). The exercises can be performed three times per day, two to three sets of 10 to 15 repetitions per exercise during each session is reasonable.  ?Weeks 8 to 10: Full upper extremity strength training is resumed provided the patient does not experience significant pain when performing these exercises. The importance of increasing resistance gradually and avoiding exercises that cause pain must be emphasized to the patient [13]. If pain persists beyond this time, the clinician should evaluate the patient for [facility] degeneration and other shoulder pathology. (See "Acromioclavicular joint disorders" and "Evaluation of the adult with shoulder complaints".)

## 2022-08-21 NOTE — Progress Notes (Signed)
BP 134/77   Pulse 90   Temp 98.3 F (36.8 C)   Ht '5\' 7"'$  (1.702 m)   Wt 242 lb (109.8 kg)   SpO2 97%   BMI 37.90 kg/m    Subjective:   Patient ID: Steve Williams, male    DOB: [birth year], 67 y.o.   MRN: [id number]  HPI: Steve Williams is a 67 y.o. 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  This happened 5 days ago at work.  He says it hurts more with cross body reaching and overhead.  Not as much reaching back.  He says it hurt immediately when he had the accident.  He says when his coworker let go it really torqued his left arm down hold his left shoulder down as well.  Relevant past medical, surgical, family and social history reviewed and updated as indicated. Interim medical history since our last visit reviewed. Allergies and medications reviewed and updated.  Review of Systems  Constitutional:  Negative for chills and fever.  Respiratory:  Negative for shortness of breath and wheezing.   Cardiovascular:  Negative for chest pain and leg swelling.  Musculoskeletal:  Positive for arthralgias and myalgias. Negative for back pain and gait problem.  Skin:  Negative for rash.  All other systems reviewed and are negative.   Per HPI unless specifically indicated above   Allergies as of 08/21/2022       Reactions   Lisinopril    Erythromycin Rash        Medication List        Accurate as of August 21, 2022 11:39 AM. If you have any questions, ask your nurse or doctor.          STOP taking these medications    tobramycin-dexamethasone ophthalmic solution Commonly known as: TobraDex Stopped by: Fransisca Kaufmann Ariyah Sedlack, MD       TAKE these medications    aspirin 81 MG tablet Take 81 mg by mouth daily.   B-D 3CC LUER-LOK SYR 22GX1" 22G X 1" 3 ML Misc Generic drug: SYRINGE-NEEDLE (DISP) 3 ML USE TO GIVE  TESTOSTERONE   Syringe/Needle (Disp) 18G X 1" 3 ML Misc Use as directed Use to draw up testosterone   BD Disp Needles 18G X 1-1/2" Misc Generic drug: NEEDLE (DISP) 18 G USE TO draw UP testosterone   fluticasone 50 MCG/ACT nasal spray Commonly known as: FLONASE Place into both nostrils daily.   hydrochlorothiazide 25 MG tablet Commonly known as: HYDRODIURIL Take 1 tablet (25 mg total) by mouth daily.   loratadine 10 MG tablet Commonly known as: CLARITIN Take 10 mg by mouth daily.   losartan 100 MG tablet Commonly known as: COZAAR Take 1 tablet (100 mg total) by mouth daily.   meloxicam 15 MG tablet Commonly known as: MOBIC Take 1 tablet (15 mg total) by mouth daily.   montelukast 10 MG tablet Commonly known as: SINGULAIR Take 1 tablet (10 mg total) by mouth at bedtime.   sertraline 100 MG tablet Commonly known as: ZOLOFT Take 1 tablet (100 mg total) by mouth daily.   sildenafil 100 MG tablet Commonly known as: VIAGRA Take 1 tablet (100 mg total) by mouth daily as needed for erectile dysfunction.   sildenafil 20 MG tablet Commonly known as: REVATIO take ONE TO FIVE TABLETS BY MOUTH EVERY DAY AS NEEDED  simvastatin 40 MG tablet Commonly known as: ZOCOR Take 1 tablet (40 mg total) by mouth daily.   testosterone cypionate 200 MG/ML injection Commonly known as: DEPOTESTOSTERONE CYPIONATE INJECT 0.5 ML INTRAMUSCULARLY EVERY 7 DAYS         Objective:   BP 134/77   Pulse 90   Temp 98.3 F (36.8 C)   Ht '5\' 7"'$  (1.702 m)   Wt 242 lb (109.8 kg)   SpO2 97%   BMI 37.90 kg/m   Wt Readings from Last 3 Encounters:  08/21/22 242 lb (109.8 kg)  06/06/22 244 lb (110.7 kg)  05/22/22 243 lb 9.6 oz (110.5 kg)    Physical Exam Vitals and nursing note reviewed.  Constitutional:      Appearance: Normal appearance.  Musculoskeletal:     Left shoulder: Tenderness (Overlying the top of shoulder especially near the) and crepitus present. Normal range of motion.      Comments: Pain with cross body motion and overhead range of motion.  Neurological:     Mental Status: He is alert.       Assessment & Plan:   Problem List Items Addressed This Visit   None Visit Diagnoses     Arthralgia of left acromioclavicular joint    -  Primary     Concern for [facility] joint separation or injury, will do x-ray if patient cannot.  Given handout for conservative management, will get x-ray if possible.  Follow up plan: Return if symptoms worsen or fail to improve.  Counseling provided for all of the vaccine components No orders of the defined types were placed in this encounter.   Caryl Pina, MD [facility] 08/21/2022, 11:39 AM

## 2022-08-24 ENCOUNTER — Other Ambulatory Visit (INDEPENDENT_AMBULATORY_CARE_PROVIDER_SITE_OTHER): Payer: Medicare Other

## 2022-08-24 DIAGNOSIS — M25512 Pain in left shoulder: Secondary | ICD-10-CM | POA: Diagnosis not present

## 2022-08-28 ENCOUNTER — Encounter: Payer: Self-pay | Admitting: Family Medicine

## 2022-08-29 ENCOUNTER — Ambulatory Visit (INDEPENDENT_AMBULATORY_CARE_PROVIDER_SITE_OTHER): Payer: Medicare Other

## 2022-08-29 VITALS — Ht 67.0 in | Wt 242.0 lb

## 2022-08-29 DIAGNOSIS — Z0000 Encounter for general adult medical examination without abnormal findings: Secondary | ICD-10-CM

## 2022-08-29 NOTE — Patient Instructions (Signed)
[name] , Thank you for taking time to come for your Medicare Wellness Visit. I appreciate your ongoing commitment to your health goals. Please review the following plan we discussed and let me know if I can assist you in the future.   These are the goals we discussed:  Goals      Achieve a Healthy Weight-Obesity     Timeframe:  Long-Range Goal Priority:  Medium Start Date:                             Expected End Date:                       Follow Up Date 08/29/2022    - change to whole grain breads, cereal, pasta - eat 5 or 6 small meals each day - eat fish at least once per week - join a weight loss program - limit fast food meals to no more than 1 per week - set a realistic goal    Why is this important?   When you are ready to manage your weight, have a plan and have set a goal, it is time to take action.  Taking small steps to change how you eat and exercise is a good place to start.    Notes:         This is a list of the screening recommended for you and due dates:  Health Maintenance  Topic Date Due   Yearly kidney health urinalysis for diabetes  11/28/2018   DTaP/Tdap/Td vaccine (3 - Td or Tdap) 11/02/2019   Eye exam for diabetics  07/02/2020   Flu Shot  10/29/2022*   Pneumonia Vaccine (3 - PPSV23 or PCV20) 11/03/2022*   Zoster (Shingles) Vaccine (1 of 2) 11/03/2022*   Hemoglobin A1C  11/03/2022   Yearly kidney function blood test for diabetes  05/05/2023   Complete foot exam   05/05/2023   Medicare Annual Wellness Visit  08/30/2023   Colon Cancer Screening  08/18/2024   HPV Vaccine  Aged Out   COVID-19 Vaccine  Discontinued   Hepatitis C Screening: USPSTF Recommendation to screen - Ages 18-79 yo.  Discontinued  *Topic was postponed. The date shown is not the original due date.    Advanced directives: Advance directive discussed with you today. I have provided a copy for you to complete at home and have notarized. Once this is complete please bring a copy  in to our office so we can scan it into your chart.   Conditions/risks identified: Aim for 30 minutes of exercise or brisk walking, 6-8 glasses of water, and 5 servings of fruits and vegetables each day.   Next appointment: Follow up in one year for your annual wellness visit.   Preventive Care 77 Years and Older, Male  Preventive care refers to lifestyle choices and visits with your health care provider that can promote health and wellness. What does preventive care include? A yearly physical exam. This is also called an annual well check. Dental exams once or twice a year. Routine eye exams. Ask your health care provider how often you should have your eyes checked. Personal lifestyle choices, including: Daily care of your teeth and gums. Regular physical activity. Eating a healthy diet. Avoiding tobacco and drug use. Limiting alcohol use. Practicing safe sex. Taking low doses of aspirin every day. Taking vitamin and mineral supplements as recommended by your health care provider.  What happens during an annual well check? The services and screenings done by your health care provider during your annual well check will depend on your age, overall health, lifestyle risk factors, and family history of disease. Counseling  Your health care provider may ask you questions about your: Alcohol use. Tobacco use. Drug use. Emotional well-being. Home and relationship well-being. Sexual activity. Eating habits. History of falls. Memory and ability to understand (cognition). Work and work Statistician. Screening  You may have the following tests or measurements: Height, weight, and BMI. Blood pressure. Lipid and cholesterol levels. These may be checked every 5 years, or more frequently if you are over 108 years old. Skin check. Lung cancer screening. You may have this screening every year starting at age 67 if you have a 30-pack-year history of smoking and currently smoke or have quit  within the past 15 years. Fecal occult blood test (FOBT) of the stool. You may have this test every year starting at age 38. Flexible sigmoidoscopy or colonoscopy. You may have a sigmoidoscopy every 5 years or a colonoscopy every 10 years starting at age 51. Prostate cancer screening. Recommendations will vary depending on your family history and other risks. Hepatitis C blood test. Hepatitis B blood test. Sexually transmitted disease (STD) testing. Diabetes screening. This is done by checking your blood sugar (glucose) after you have not eaten for a while (fasting). You may have this done every 1-3 years. Abdominal aortic aneurysm (AAA) screening. You may need this if you are a current or former smoker. Osteoporosis. You may be screened starting at age 31 if you are at high risk. Talk with your health care provider about your test results, treatment options, and if necessary, the need for more tests. Vaccines  Your health care provider may recommend certain vaccines, such as: Influenza vaccine. This is recommended every year. Tetanus, diphtheria, and acellular pertussis (Tdap, Td) vaccine. You may need a Td booster every 10 years. Zoster vaccine. You may need this after age 63. Pneumococcal 13-valent conjugate (PCV13) vaccine. One dose is recommended after age 37. Pneumococcal polysaccharide (PPSV23) vaccine. One dose is recommended after age 51. Talk to your health care provider about which screenings and vaccines you need and how often you need them. This information is not intended to replace advice given to you by your health care provider. Make sure you discuss any questions you have with your health care provider. Document Released: 08/13/2015 Document Revised: 04/05/2016 Document Reviewed: 05/18/2015 Elsevier Interactive Patient Education  2017 [place] Prevention in the Home Falls can cause injuries. They can happen to people of all ages. There are many things you can do  to make your home safe and to help prevent falls. What can I do on the outside of my home? Regularly fix the edges of walkways and driveways and fix any cracks. Remove anything that might make you trip as you walk through a door, such as a raised step or threshold. Trim any bushes or trees on the path to your home. Use bright outdoor lighting. Clear any walking paths of anything that might make someone trip, such as rocks or tools. Regularly check to see if handrails are loose or broken. Make sure that both sides of any steps have handrails. Any raised decks and porches should have guardrails on the edges. Have any leaves, snow, or ice cleared regularly. Use sand or salt on walking paths during winter. Clean up any spills in your garage right away. This includes  oil or grease spills. What can I do in the bathroom? Use night lights. Install grab bars by the toilet and in the tub and shower. Do not use towel bars as grab bars. Use non-skid mats or decals in the tub or shower. If you need to sit down in the shower, use a plastic, non-slip stool. Keep the floor dry. Clean up any water that spills on the floor as soon as it happens. Remove soap buildup in the tub or shower regularly. Attach bath mats securely with double-sided non-slip rug tape. Do not have throw rugs and other things on the floor that can make you trip. What can I do in the bedroom? Use night lights. Make sure that you have a light by your bed that is easy to reach. Do not use any sheets or blankets that are too big for your bed. They should not hang down onto the floor. Have a firm chair that has side arms. You can use this for support while you get dressed. Do not have throw rugs and other things on the floor that can make you trip. What can I do in the kitchen? Clean up any spills right away. Avoid walking on wet floors. Keep items that you use a lot in easy-to-reach places. If you need to reach something above you, use  a strong step stool that has a grab bar. Keep electrical cords out of the way. Do not use floor polish or wax that makes floors slippery. If you must use wax, use non-skid floor wax. Do not have throw rugs and other things on the floor that can make you trip. What can I do with my stairs? Do not leave any items on the stairs. Make sure that there are handrails on both sides of the stairs and use them. Fix handrails that are broken or loose. Make sure that handrails are as long as the stairways. Check any carpeting to make sure that it is firmly attached to the stairs. Fix any carpet that is loose or worn. Avoid having throw rugs at the top or bottom of the stairs. If you do have throw rugs, attach them to the floor with carpet tape. Make sure that you have a light switch at the top of the stairs and the bottom of the stairs. If you do not have them, ask someone to add them for you. What else can I do to help prevent falls? Wear shoes that: Do not have high heels. Have rubber bottoms. Are comfortable and fit you well. Are closed at the toe. Do not wear sandals. If you use a stepladder: Make sure that it is fully opened. Do not climb a closed stepladder. Make sure that both sides of the stepladder are locked into place. Ask someone to hold it for you, if possible. Clearly mark and make sure that you can see: Any grab bars or handrails. First and last steps. Where the edge of each step is. Use tools that help you move around (mobility aids) if they are needed. These include: Canes. Walkers. Scooters. Crutches. Turn on the lights when you go into a dark area. Replace any light bulbs as soon as they burn out. Set up your furniture so you have a clear path. Avoid moving your furniture around. If any of your floors are uneven, fix them. If there are any pets around you, be aware of where they are. Review your medicines with your doctor. Some medicines can make you feel dizzy. This can  increase your chance of falling. Ask your doctor what other things that you can do to help prevent falls. This information is not intended to replace advice given to you by your health care provider. Make sure you discuss any questions you have with your health care provider. Document Released: 05/13/2009 Document Revised: 12/23/2015 Document Reviewed: 08/21/2014 Elsevier Interactive Patient Education  2017 [employer].

## 2022-08-29 NOTE — Progress Notes (Signed)
Subjective:   Steve Williams is a 67 y.o. male who presents for an Initial Medicare Annual Wellness Visit. I connected with  [name] on 08/29/22 by a audio enabled telemedicine application and verified that I am speaking with the correct person using two identifiers.  Patient Location: Home  Provider Location: Home Office  I discussed the limitations of evaluation and management by telemedicine. The patient expressed understanding and agreed to proceed.  Review of Systems     Cardiac Risk Factors include: advanced age (>75mn, >>52women);male gender     Objective:    Today's Vitals   08/29/22 1427  Weight: 242 lb (109.8 kg)  Height: '5\' 7"'$  (1.702 m)   Body mass index is 37.9 kg/m.     08/29/2022    2:32 PM 08/26/2021    1:46 PM 08/25/2020    2:22 PM  Advanced Directives  Does Patient Have a Medical Advance Directive? No No No  Would patient like information on creating a medical advance directive? No - Patient declined No - Patient declined No - Patient declined    Current Medications (verified) Outpatient Encounter Medications as of 08/29/2022  Medication Sig   aspirin 81 MG tablet Take 81 mg by mouth daily.   B-D 3CC LUER-LOK SYR 22GX1" 22G X 1" 3 ML MISC USE TO GIVE TESTOSTERONE   BD DISP NEEDLES 18G X 1-1/2" MISC USE TO draw UP testosterone   fluticasone (FLONASE) 50 MCG/ACT nasal spray Place into both nostrils daily.   hydrochlorothiazide (HYDRODIURIL) 25 MG tablet Take 1 tablet (25 mg total) by mouth daily.   loratadine (CLARITIN) 10 MG tablet Take 10 mg by mouth daily.   losartan (COZAAR) 100 MG tablet Take 1 tablet (100 mg total) by mouth daily.   meloxicam (MOBIC) 15 MG tablet Take 1 tablet (15 mg total) by mouth daily.   montelukast (SINGULAIR) 10 MG tablet Take 1 tablet (10 mg total) by mouth at bedtime.   sertraline (ZOLOFT) 100 MG tablet Take 1 tablet (100 mg total) by mouth daily.   sildenafil (REVATIO) 20 MG tablet take ONE TO FIVE TABLETS BY MOUTH  EVERY DAY AS NEEDED   sildenafil (VIAGRA) 100 MG tablet Take 1 tablet (100 mg total) by mouth daily as needed for erectile dysfunction.   simvastatin (ZOCOR) 40 MG tablet Take 1 tablet (40 mg total) by mouth daily.   Syringe/Needle, Disp, 18G X 1" 3 ML MISC Use as directed Use to draw up testosterone   testosterone cypionate (DEPOTESTOSTERONE CYPIONATE) 200 MG/ML injection INJECT 0.5 ML INTRAMUSCULARLY EVERY 7 DAYS   No facility-administered encounter medications on file as of 08/29/2022.    Allergies (verified) Lisinopril and Erythromycin   History: Past Medical History:  Diagnosis Date   Allergy    Anxiety    Arthritis    Asthma    [place]cer ([place]    Depression    Diabetes mellitus without complication ([place]    Hyperlipidemia    Hypertension    Past Surgical History:  Procedure Laterality Date   CARPAL BONE EXCISION Bilateral    KNEE ARTHROSCOPY Left    KNEE SURGERY Left 1974   lipoma removed     TRIGGER FINGER RELEASE Right    Family History  Problem Relation Age of Onset   Heart disease Mother    Hypertension Mother    Diabetes Mother    Heart disease Father    Cancer Father        liver   Diabetes  Father    Hypertension Sister    Diabetes Sister    Migraines Son    Social History   Socioeconomic History   Marital status: Legally Separated    Spouse name: Not on file   Number of children: 2   Years of education: Not on file   Highest education level: Not on file  Occupational History   Occupation: metal scrapping part time    Employer: Building [employer] SEL[employer] EM[employer]OY[employer]  Tobacco Use   Smoking status: Never   Smokeless tobacco: Never  Substance and Sexual Activity   Alcohol use: No   Drug use: No   Sexual activity: Not on file  Other Topics Concern   Not on file  Social History Narrative   One son lives in [place], other son in [place] Strain: [place]  (08/29/2022)   Overall Financial Resource  Strain (CARDIA)    Difficulty of Paying Living Expenses: Not hard at all  Food Insecurity: No Food Insecurity (08/29/2022)   Hunger Vital Sign    Worried About Running Out of Food in the Last Year: Never true    Ran Out of Food in the Last Year: Never true  Transportation Needs: No Transportation Needs (08/29/2022)   PRAPARE - Hydrologist (Medical): No    Lack of Transportation (Non-Medical): No  Physical Activity: Insufficiently Active (08/29/2022)   Exercise Vital Sign    Days of Exercise per Week: 3 days    Minutes of Exercise per Session: 30 min  Stress: No Stress Concern Present (08/29/2022)   [place]    Feeling of Stress : Not at all  Social Connections: Moderately Isolated (08/29/2022)   Social Connection and Isolation Panel [NHANES]    Frequency of Communication with Friends and Family: More than three times a week    Frequency of Social Gatherings with Friends and Family: More than three times a week    Attends Religious Services: Never    Marine scientist or Organizations: Yes    Attends Music therapist: 1 to 4 times per year    Marital Status: Separated    Tobacco Counseling Counseling given: Not Answered   Clinical Intake:  Pre-visit preparation completed: Yes  Pain : No/denies pain     Nutritional Risks: None Diabetes: No  How often do you need to have someone help you when you read instructions, pamphlets, or other written materials from your doctor or pharmacy?: 1 - Never  Diabetic?no   Interpreter Needed?: No  Information entered by :: Jadene Pierini, LPN   Activities of Daily Living    08/29/2022    2:32 PM  In your present state of health, do you have any difficulty performing the following activities:  Hearing? 0  Vision? 0  Difficulty concentrating or making decisions? 0  Walking or climbing stairs? 0  Dressing or bathing? 0   Doing errands, shopping? 0  Preparing Food and eating ? N  Using the Toilet? N  In the past six months, have you accidently leaked urine? N  Do you have problems with loss of bowel control? N  Managing your Medications? N  Managing your Finances? N  Housekeeping or managing your Housekeeping? N    Patient Care Team: Dettinger, Fransisca Kaufmann, MD as PCP - General (Family Medicine) Raylene Miyamoto, MD as Referring Physician (Otolaryngology) Sandi Carne, MD (  Chiropractic Medicine) Franchot Gallo, MD as Consulting Physician (Urology)  Indicate any recent Medical Services you may have received from other than [facility] providers in the past year (date may be approximate).     Assessment:   This is a routine wellness examination for Y[place].  Hearing/Vision screen Vision Screening - Comments:: Due patient to schedule Dr.Davis   Dietary issues and exercise activities discussed: Current Exercise Habits: Home exercise routine, Type of exercise: walking, Time (Minutes): 30, Frequency (Times/Week): 3, Weekly Exercise (Minutes/Week): 90, Intensity: Mild, Exercise limited by: orthopedic condition(s)   Goals Addressed             This Visit's Progress    Achieve a Healthy Weight-Obesity   On track    Timeframe:  Long-Range Goal Priority:  Medium Start Date:                             Expected End Date:                       Follow Up Date 08/29/2022    - change to whole grain breads, cereal, pasta - eat 5 or 6 small meals each day - eat fish at least once per week - join a weight loss program - limit fast food meals to no more than 1 per week - set a realistic goal    Why is this important?   When you are ready to manage your weight, have a plan and have set a goal, it is time to take action.  Taking small steps to change how you eat and exercise is a good place to start.    Notes:        Depression Screen    08/21/2022   11:14 AM 05/22/2022    9:18 AM 05/04/2022    8:16 AM  03/30/2022    9:16 AM 12/19/2021    2:20 PM 11/02/2021    9:39 AM 08/26/2021    1:40 PM  PHQ 2/9 Scores  PHQ - 2 Score 0 0 0 0 0 0 0  PHQ- 9 Score 0 0 0  0 0   Exception Documentation   Patient refusal        Fall Risk    08/29/2022    2:29 PM 08/21/2022   11:14 AM 05/22/2022    9:18 AM 05/04/2022    8:16 AM 03/30/2022    9:20 AM  Fall Risk   Falls in the past year? 0 0 '1 1 1  '$ Number falls in past yr: 0  0 0 0  Injury with Fall? 0  0 0 0  Risk for fall due to : No Fall Risks   History of fall(s) History of fall(s)  Follow up Falls prevention discussed  Falls prevention discussed Falls evaluation completed Falls evaluation completed    [place]:  Any stairs in or around the home? Yes  If so, are there any without handrails? No  Home free of loose throw rugs in walkways, pet beds, electrical cords, etc? Yes  Adequate lighting in your home to reduce risk of falls? Yes   ASSISTIVE DEVICES UTILIZED TO PREVENT FALLS:  Life alert? No  Use of a cane, walker or w/c? No  Grab bars in the bathroom? No  Shower chair or bench in shower? No  Elevated toilet seat or a handicapped toilet? No  08/29/2022    2:32 PM 08/26/2021    1:49 PM 08/25/2020    2:24 PM  6CIT Screen  What Year? 0 points 0 points 0 points  What month? 0 points 0 points 0 points  What time? 0 points 0 points 0 points  Count back from 20 0 points 0 points 0 points  Months in reverse 0 points 0 points 0 points  Repeat phrase 0 points 0 points 0 points  Total Score 0 points 0 points 0 points    Immunizations Immunization History  Administered Date(s) Administered   Influenza, Seasonal, Injecte, Preservative Fre 07/03/2016   Influenza,inj,Quad PF,6+ Mos 05/17/2017   Influenza-Unspecified 04/29/2015, 05/15/2018   PFIZER(Purple Top)SARS-COV-2 Vaccination 04/12/2020, 05/03/2020   Pneumococcal Conjugate-13 01/25/2011   Pneumococcal Polysaccharide-23 04/07/2016   Td  11/01/2009   Tdap 11/01/2009   Zoster, Live 08/17/2010    TDAP status: Due, Education has been provided regarding the importance of this vaccine. Advised may receive this vaccine at local pharmacy or Health Dept. Aware to provide a copy of the vaccination record if obtained from local pharmacy or Health Dept. Verbalized acceptance and understanding.  Flu Vaccine status: Declined, Education has been provided regarding the importance of this vaccine but patient still declined. Advised may receive this vaccine at local pharmacy or Health Dept. Aware to provide a copy of the vaccination record if obtained from local pharmacy or Health Dept. Verbalized acceptance and understanding.  Pneumococcal vaccine status: Up to date  Covid-19 vaccine status: Completed vaccines  Qualifies for Shingles Vaccine? Yes   Zostavax completed Yes   Shingrix Completed?: Yes  Screening Tests Health Maintenance  Topic Date Due   Diabetic kidney evaluation - Urine ACR  11/28/2018   DTaP/Tdap/Td (3 - Td or Tdap) 11/02/2019   OPHTHALMOLOGY EXAM  07/02/2020   INFLUENZA VACCINE  10/29/2022 (Originally 02/28/2022)   Pneumonia Vaccine 79+ Years old (3 - PPSV23 or PCV20) 11/03/2022 (Originally 04/07/2021)   Zoster Vaccines- Shingrix (1 of 2) 11/03/2022 (Originally 09/07/1974)   HEMOGLOBIN A1C  11/03/2022   Diabetic kidney evaluation - eGFR measurement  05/05/2023   FOOT EXAM  05/05/2023   Medicare Annual Wellness (AWV)  08/30/2023   COLONOSCOPY (Pts 45-15yr Insurance coverage will need to be confirmed)  08/18/2024   HPV VACCINES  Aged Out   COVID-19 Vaccine  Discontinued   Hepatitis C Screening  Discontinued    Health Maintenance  Health Maintenance Due  Topic Date Due   Diabetic kidney evaluation - Urine ACR  11/28/2018   DTaP/Tdap/Td (3 - Td or Tdap) 11/02/2019   OPHTHALMOLOGY EXAM  07/02/2020    Colorectal cancer screening: Type of screening: Colonoscopy. Completed 08/18/2014. Repeat every 10 years  Lung  Cancer Screening: (Low Dose CT Chest recommended if Age 68-80years, 30 pack-year currently smoking OR have quit w/in 15years.) does not qualify.   Lung Cancer Screening Referral: n/a  Additional Screening:  Hepatitis C Screening: does not qualify;   Vision Screening: Recommended annual ophthalmology exams for early detection of glaucoma and other disorders of the eye. Is the patient up to date with their annual eye exam?  No  Who is the provider or what is the name of the office in which the patient attends annual eye exams? None patient to schedule  If pt is not established with a provider, would they like to be referred to a provider to establish care? No .   Dental Screening: Recommended annual dental exams for proper oral hygiene  Community Resource Referral /  Chronic Care Management: CRR required this visit?  No   CCM required this visit?  No      Plan:     I have personally reviewed and noted the following in the patient's chart:   Medical and social history Use of alcohol, tobacco or illicit drugs  Current medications and supplements including opioid prescriptions. Patient is not currently taking opioid prescriptions. Functional ability and status Nutritional status Physical activity Advanced directives List of other physicians Hospitalizations, surgeries, and ER visits in previous 12 months Vitals Screenings to include cognitive, depression, and falls Referrals and appointments  In addition, I have reviewed and discussed with patient certain preventive protocols, quality metrics, and best practice recommendations. A written personalized care plan for preventive services as well as general preventive health recommendations were provided to patient.     Daphane Shepherd, LPN   04/01/1114   Nurse Notes: Due TDAP vaccine

## 2022-10-23 ENCOUNTER — Encounter: Payer: Self-pay | Admitting: Family Medicine

## 2022-10-23 ENCOUNTER — Ambulatory Visit (INDEPENDENT_AMBULATORY_CARE_PROVIDER_SITE_OTHER): Payer: Medicare Other | Admitting: Family Medicine

## 2022-10-23 VITALS — BP 136/73 | HR 66 | Ht 67.0 in | Wt 246.0 lb

## 2022-10-23 DIAGNOSIS — M170 Bilateral primary osteoarthritis of knee: Secondary | ICD-10-CM

## 2022-10-23 MED ORDER — METHYLPREDNISOLONE ACETATE 80 MG/ML IJ SUSP
80.0000 mg | Freq: Once | INTRAMUSCULAR | Status: AC
  Administered 2022-10-23: 80 mg via INTRA_ARTICULAR

## 2022-10-23 NOTE — Progress Notes (Signed)
BP 136/73   Pulse 66   Ht 5\' 7"  (1.702 m)   Wt 246 lb (111.6 kg)   SpO2 95%   BMI 38.53 kg/m    Subjective:   Patient ID: Steve Williams, male    DOB: 22-Jan-1956, 67 y.o.   MRN: [id number]  HPI: Steve Williams is a 67 y.o. male presenting on 10/23/2022 for Knee Pain (Bilateral. Requesting another injection in both. Last injections in October 2023.)  Patient presents today inquiring bilateral cortisone injections in both knees for ongoing osteoarthritis. Patient states he has had osteoarthritis in his left knee for 15 years and right knee for 5 years. Patient states he has received prior injections in the past with them lasting roughly one year before needing additional injections. Patient works at a [employer] where he lifts 500+ lbs frequently and is constantly on his feet. Patient states he has been using Meloxicam and aspercreme with lidocaine every day on his knees. Patient has lost over 50 lbs in the past three years. Patient states he had left knee tendon surgery in high school and his left knee was "scoped"  by an orthopedist 5 years ago. Patient states he has been seeing an orthopedist for 15 years, has had a few effusions in the past, and has seen knee specialists at [facility] in [place].   Relevant past medical, surgical, family and social history were reviewed and updated as indicated. Interim medical history were reviewed. Allergies and medications were reviewed and updated.  Review of Systems  Constitutional:  Negative for fever.  Eyes:  Negative for redness and visual disturbance.  Respiratory:  Negative for chest tightness and shortness of breath.   Cardiovascular:  Negative for chest pain and leg swelling.  Gastrointestinal:  Negative for abdominal pain.  Musculoskeletal:  Positive for arthralgias. Negative for gait problem, joint swelling and myalgias.  Skin:  Negative for rash and wound.  All other systems reviewed and are  negative.   Per HPI unless specifically indicated above.   Allergies as of 10/23/2022       Reactions   Lisinopril    Erythromycin Rash        Medication List        Accurate as of October 23, 2022  1:58 PM. If you have any questions, ask your nurse or doctor.          aspirin 81 MG tablet Take 81 mg by mouth daily.   B-D 3CC LUER-LOK SYR 22GX1" 22G X 1" 3 ML Misc Generic drug: SYRINGE-NEEDLE (DISP) 3 ML USE TO GIVE TESTOSTERONE   Syringe/Needle (Disp) 18G X 1" 3 ML Misc Use as directed Use to draw up testosterone   BD Disp Needles 18G X 1-1/2" Misc Generic drug: NEEDLE (DISP) 18 G USE TO draw UP testosterone   fluticasone 50 MCG/ACT nasal spray Commonly known as: FLONASE Place into both nostrils daily.   hydrochlorothiazide 25 MG tablet Commonly known as: HYDRODIURIL Take 1 tablet (25 mg total) by mouth daily.   loratadine 10 MG tablet Commonly known as: CLARITIN Take 10 mg by mouth daily.   losartan 100 MG tablet Commonly known as: COZAAR Take 1 tablet (100 mg total) by mouth daily.   meloxicam 15 MG tablet Commonly known as: MOBIC Take 1 tablet (15 mg total) by mouth daily.   montelukast 10 MG tablet Commonly known as: SINGULAIR Take 1 tablet (10 mg total) by mouth at bedtime.   sertraline 100 MG tablet  Commonly known as: ZOLOFT Take 1 tablet (100 mg total) by mouth daily.   sildenafil 100 MG tablet Commonly known as: VIAGRA Take 1 tablet (100 mg total) by mouth daily as needed for erectile dysfunction.   sildenafil 20 MG tablet Commonly known as: REVATIO take ONE TO FIVE TABLETS BY MOUTH EVERY DAY AS NEEDED   simvastatin 40 MG tablet Commonly known as: ZOCOR Take 1 tablet (40 mg total) by mouth daily.   testosterone cypionate 200 MG/ML injection Commonly known as: DEPOTESTOSTERONE CYPIONATE INJECT 0.5 ML INTRAMUSCULARLY EVERY 7 DAYS         Objective:   BP 136/73   Pulse 66   Ht 5\' 7"  (1.702 m)   Wt 246 lb (111.6 kg)    SpO2 95%   BMI 38.53 kg/m   Wt Readings from Last 3 Encounters:  10/23/22 246 lb (111.6 kg)  08/29/22 242 lb (109.8 kg)  08/21/22 242 lb (109.8 kg)    Physical Exam Vitals reviewed.  Constitutional:      Appearance: Normal appearance.  HENT:     Head: Normocephalic and atraumatic.  Eyes:     General: No scleral icterus.    Conjunctiva/sclera: Conjunctivae normal.  Musculoskeletal:        General: No swelling, deformity or signs of injury.     Right knee: No effusion, erythema, bony tenderness or crepitus. Tenderness present over the lateral joint line. No LCL laxity, MCL laxity, ACL laxity or PCL laxity.     Instability Tests: Anterior drawer test negative. Posterior drawer test negative. Medial McMurray test negative and lateral McMurray test negative.     Left knee: No effusion, erythema, bony tenderness or crepitus. Tenderness present over the lateral joint line. No LCL laxity, MCL laxity, ACL laxity or PCL laxity.    Instability Tests: Anterior drawer test negative. Posterior drawer test negative. Medial McMurray test negative and lateral McMurray test negative.     Right lower leg: Normal. No edema.     Left lower leg: Normal. No edema.  Skin:    General: Skin is warm and dry.  Neurological:     Mental Status: He is alert and oriented to person, place, and time.     Gait: Gait normal.  Psychiatric:        Mood and Affect: Mood normal.        Behavior: Behavior normal.    Assessment & Plan:   Problem List Items Addressed This Visit   None Visit Diagnoses     Bilateral primary osteoarthritis of knee    -  Primary   Relevant Medications   methylPREDNISolone acetate (DEPO-MEDROL) injection 80 mg (Completed)   methylPREDNISolone acetate (DEPO-MEDROL) injection 80 mg (Completed)      Bilateral knee injection procedural note: Informed consent was obtained with signed form. Discussed risk factors of bleeding and infection with patient, and patient agreed to proceed with  injections. Injection sites were palpated and marked, then cleansed with Betadine followed by alcohol swab. Topical ethyl chloride spray was used for anesthetic. 80 mg/mL of DepoMedrol mixed with 1 mL of 2% lidocaine w/o epinephrine was injected in lateral approach. Patient tolerated procedure well without any complications or side effects. Minimal to no bleeding occurred and band aids were applied afterwards.   Patient was given Methylprednisolone acetate (DepoMedrol) injection 80 mg/mL in bilateral knees. Also discussed using voltaren gel. Continue Meloxicam and aspercreme with lidocaine.  Follow up plan: Return if symptoms worsen or fail to improve.  Counseling provided for  all of the vaccine components No orders of the defined types were placed in this encounter.   Summer Wedgefield, PA-[facility] [place] 10/23/2022, 1:58 PM   Patient seen and examined with PA student, agree with assessment and plan above.  Injections were performed together. Vonna Kotyk Keira Bohlin [facility] 10/23/2022, 1:58 PM

## 2022-11-03 ENCOUNTER — Ambulatory Visit (INDEPENDENT_AMBULATORY_CARE_PROVIDER_SITE_OTHER): Payer: Medicare Other | Admitting: Family Medicine

## 2022-11-03 ENCOUNTER — Encounter: Payer: Self-pay | Admitting: Family Medicine

## 2022-11-03 VITALS — BP 123/72 | HR 62 | Ht 67.0 in | Wt 245.0 lb

## 2022-11-03 DIAGNOSIS — E785 Hyperlipidemia, unspecified: Secondary | ICD-10-CM | POA: Diagnosis not present

## 2022-11-03 DIAGNOSIS — E1169 Type 2 diabetes mellitus with other specified complication: Secondary | ICD-10-CM | POA: Diagnosis not present

## 2022-11-03 DIAGNOSIS — Z23 Encounter for immunization: Secondary | ICD-10-CM

## 2022-11-03 DIAGNOSIS — I152 Hypertension secondary to endocrine disorders: Secondary | ICD-10-CM | POA: Diagnosis not present

## 2022-11-03 DIAGNOSIS — E1159 Type 2 diabetes mellitus with other circulatory complications: Secondary | ICD-10-CM

## 2022-11-03 LAB — CMP14+EGFR
ALT: 23 IU/L (ref 0–44)
AST: 15 IU/L (ref 0–40)
Albumin/Globulin Ratio: 2.4 — ABNORMAL HIGH (ref 1.2–2.2)
Albumin: 4.1 g/dL (ref 3.9–4.9)
Alkaline Phosphatase: 89 IU/L (ref 44–121)
BUN/Creatinine Ratio: 22 (ref 10–24)
BUN: 18 mg/dL (ref 8–27)
Bilirubin Total: 0.6 mg/dL (ref 0.0–1.2)
CO2: 23 mmol/L (ref 20–29)
Calcium: 8.8 mg/dL (ref 8.6–10.2)
Chloride: 101 mmol/L (ref 96–106)
Creatinine, Ser: 0.83 mg/dL (ref 0.76–1.27)
Globulin, Total: 1.7 g/dL (ref 1.5–4.5)
Glucose: 138 mg/dL — ABNORMAL HIGH (ref 70–99)
Potassium: 4 mmol/L (ref 3.5–5.2)
Sodium: 139 mmol/L (ref 134–144)
Total Protein: 5.8 g/dL — ABNORMAL LOW (ref 6.0–8.5)
eGFR: 95 mL/min/{1.73_m2} (ref >59)

## 2022-11-03 LAB — CBC WITH DIFFERENTIAL/PLATELET
Basophils Absolute: 0.1 10*3/uL (ref 0.0–0.2)
Basos: 1 %
EOS (ABSOLUTE): 0.1 10*3/uL (ref 0.0–0.4)
Eos: 2 %
Hematocrit: 48.4 % (ref 37.5–51.0)
Hemoglobin: 16.7 g/dL (ref 13.0–17.7)
Immature Grans (Abs): 0.1 10*3/uL (ref 0.0–0.1)
Immature Granulocytes: 1 %
Lymphocytes Absolute: 1.2 10*3/uL (ref 0.7–3.1)
Lymphs: 22 %
MCH: 31.2 pg (ref 26.6–33.0)
MCHC: 34.5 g/dL (ref 31.5–35.7)
MCV: 91 fL (ref 79–97)
Monocytes Absolute: 0.6 10*3/uL (ref 0.1–0.9)
Monocytes: 10 %
Neutrophils Absolute: 3.6 10*3/uL (ref 1.4–7.0)
Neutrophils: 64 %
Platelets: 105 10*3/uL — ABNORMAL LOW (ref 150–450)
RBC: 5.35 x10E6/uL (ref 4.14–5.80)
RDW: 12.6 % (ref 11.6–15.4)
WBC: 5.6 10*3/uL (ref 3.4–10.8)

## 2022-11-03 LAB — LIPID PANEL
Chol/HDL Ratio: 3.2 ratio (ref 0.0–5.0)
Cholesterol, Total: 121 mg/dL (ref 100–199)
HDL: 38 mg/dL — ABNORMAL LOW (ref >39)
LDL Chol Calc (NIH): 59 mg/dL (ref 0–99)
Triglycerides: 137 mg/dL (ref 0–149)
VLDL Cholesterol Cal: 24 mg/dL (ref 5–40)

## 2022-11-03 LAB — BAYER DCA HB A1C WAIVED: HB A1C (BAYER DCA - WAIVED): 6.4 % — ABNORMAL HIGH (ref 4.8–5.6)

## 2022-11-03 NOTE — Progress Notes (Signed)
BP 123/72   Pulse 62   Ht 5\' 7"  (1.702 m)   Wt 245 lb (111.1 kg)   SpO2 95%   BMI 38.37 kg/m    Subjective:   Patient ID: Steve Williams, male    DOB: [birth year], 67 y.o.   MRN: [id number]  HPI: Steve Williams is a 67 y.o. male presenting on 11/03/2022 for Medical Management of Chronic Issues, Hyperlipidemia, Hypertension, and Diabetes   HPI Type 2 diabetes mellitus Patient comes in today for recheck of his diabetes. Patient has been currently taking diet controlled. Patient is currently on an ACE inhibitor/ARB. Patient has not seen an ophthalmologist this year. Patient denies any new issues with their feet. The symptom started onset as an adult hypertension and hyperlipidemia and sleep apnea ARE RELATED TO DM   Hypertension Patient is currently on losartan and hydrochlorothiazide, and their blood pressure today is 123/72. Patient denies any lightheadedness or dizziness. Patient denies headaches, blurred vision, chest pains, shortness of breath, or weakness. Denies any side effects from medication and is content with current medication.   Hyperlipidemia Patient is coming in for recheck of his hyperlipidemia. The patient is currently taking simvastatin. They deny any issues with myalgias or history of liver damage from it. They deny any focal numbness or weakness or chest pain.   Relevant past medical, surgical, family and social history reviewed and updated as indicated. Interim medical history since our last visit reviewed. Allergies and medications reviewed and updated.  Review of Systems  Constitutional:  Negative for chills and fever.  Eyes:  Negative for visual disturbance.  Respiratory:  Negative for shortness of breath and wheezing.   Cardiovascular:  Negative for chest pain and leg swelling.  Musculoskeletal:  Negative for back pain and gait problem.  Skin:  Negative for rash.  Neurological:  Negative for dizziness, weakness and numbness.  All other systems reviewed and are  negative.   Per HPI unless specifically indicated above   Allergies as of 11/03/2022       Reactions   Lisinopril    Erythromycin Rash        Medication List        Accurate as of November 03, 2022  8:39 AM. If you have any questions, ask your nurse or doctor.          aspirin 81 MG tablet Take 81 mg by mouth daily.   B-D 3CC LUER-LOK SYR 22GX1" 22G X 1" 3 ML Misc Generic drug: SYRINGE-NEEDLE (DISP) 3 ML USE TO GIVE TESTOSTERONE   Syringe/Needle (Disp) 18G X 1" 3 ML Misc Use as directed Use to draw up testosterone   BD Disp Needles 18G X 1-1/2" Misc Generic drug: NEEDLE (DISP) 18 G USE TO draw UP testosterone   fluticasone 50 MCG/ACT nasal spray Commonly known as: FLONASE Place into both nostrils daily.   hydrochlorothiazide 25 MG tablet Commonly known as: HYDRODIURIL Take 1 tablet (25 mg total) by mouth daily.   loratadine 10 MG tablet Commonly known as: CLARITIN Take 10 mg by mouth daily.   losartan 100 MG tablet Commonly known as: COZAAR Take 1 tablet (100 mg total) by mouth daily.   meloxicam 15 MG tablet Commonly known as: MOBIC Take 1 tablet (15 mg total) by mouth daily.   montelukast 10 MG tablet Commonly known as: SINGULAIR Take 1 tablet (10 mg total) by mouth at bedtime.   sertraline 100 MG tablet Commonly known as: ZOLOFT Take 1 tablet (100 mg total) by  mouth daily.   sildenafil 100 MG tablet Commonly known as: VIAGRA Take 1 tablet (100 mg total) by mouth daily as needed for erectile dysfunction.   sildenafil 20 MG tablet Commonly known as: REVATIO take ONE TO FIVE TABLETS BY MOUTH EVERY DAY AS NEEDED   simvastatin 40 MG tablet Commonly known as: ZOCOR Take 1 tablet (40 mg total) by mouth daily.   testosterone cypionate 200 MG/ML injection Commonly known as: DEPOTESTOSTERONE CYPIONATE INJECT 0.5 ML INTRAMUSCULARLY EVERY 7 DAYS         Objective:   BP 123/72   Pulse 62   Ht 5\' 7"  (1.702 m)   Wt 245 lb (111.1 kg)   SpO2  95%   BMI 38.37 kg/m   Wt Readings from Last 3 Encounters:  11/03/22 245 lb (111.1 kg)  10/23/22 246 lb (111.6 kg)  08/29/22 242 lb (109.8 kg)    Physical Exam Vitals and nursing note reviewed.  Constitutional:      General: He is not in acute distress.    Appearance: He is well-developed. He is not diaphoretic.  Eyes:     General: No scleral icterus.    Conjunctiva/sclera: Conjunctivae normal.  Neck:     Thyroid: No thyromegaly.  Cardiovascular:     Rate and Rhythm: Normal rate and regular rhythm.     Heart sounds: Normal heart sounds. No murmur heard. Pulmonary:     Effort: Pulmonary effort is normal. No respiratory distress.     Breath sounds: Normal breath sounds. No wheezing.  Musculoskeletal:        General: No swelling. Normal range of motion.     Cervical back: Neck supple.  Lymphadenopathy:     Cervical: No cervical adenopathy.  Skin:    General: Skin is warm and dry.     Findings: No rash.  Neurological:     Mental Status: He is alert and oriented to person, place, and time.     Coordination: Coordination normal.  Psychiatric:        Behavior: Behavior normal.       Assessment & Plan:   Problem List Items Addressed This Visit       Cardiovascular and Mediastinum   Hypertension associated with diabetes     Endocrine   Diabetes mellitus - Primary   Relevant Orders   CBC with Differential/Platelet   CMP14+EGFR   Lipid panel   Bayer DCA Hb A1c Waived   Hyperlipidemia associated with type 2 diabetes mellitus    A1c 6.4 which is slightly up from where he has been but still within a good range.  He did have steroid injections and the holidays, he will just refocus on diet.  Blood pressure looks good, no changes. Follow up plan: Return in about 6 months (around 05/05/2023), or if symptoms worsen or fail to improve, for Hypertension and diabetes and hyperlipidemia.  Counseling provided for all of the vaccine components Orders Placed This Encounter   Procedures   CBC with Differential/Platelet   CMP14+EGFR   Lipid panel   Bayer DCA Hb A1c Waived    Arville CareJoshua Deondria Puryear, MD [facility] 11/03/2022, 8:39 AM

## 2022-11-08 NOTE — Addendum Note (Signed)
Addended by: Dorene Sorrow on: 11/08/2022 04:50 PM   Modules accepted: Orders

## 2023-01-09 ENCOUNTER — Other Ambulatory Visit: Payer: Self-pay | Admitting: Urology

## 2023-01-09 DIAGNOSIS — E291 Testicular hypofunction: Secondary | ICD-10-CM

## 2023-02-26 ENCOUNTER — Encounter: Payer: Self-pay | Admitting: Family Medicine

## 2023-02-26 ENCOUNTER — Ambulatory Visit (INDEPENDENT_AMBULATORY_CARE_PROVIDER_SITE_OTHER): Payer: Medicare Other | Admitting: Family Medicine

## 2023-02-26 VITALS — BP 128/65 | HR 82 | Temp 98.3°F | Ht 67.0 in | Wt 246.0 lb

## 2023-02-26 DIAGNOSIS — M170 Bilateral primary osteoarthritis of knee: Secondary | ICD-10-CM

## 2023-02-26 MED ORDER — METHYLPREDNISOLONE ACETATE 80 MG/ML IJ SUSP
80.0000 mg | Freq: Once | INTRAMUSCULAR | Status: AC
  Administered 2023-02-26: 80 mg via INTRA_ARTICULAR

## 2023-02-26 NOTE — Progress Notes (Signed)
Acute Office Visit  Subjective:     Patient ID: Steve Williams, male    DOB: Nov 05, 1955, 67 y.o.   MRN: [id number]  Chief Complaint  Patient presents with   Knee Pain    HPI Patient is in today for bilateral knee pain. Hx of OA in both knees. He would like to have bilateral knee injections today. Last injections were in March of this year. Prior to this, injections were beneficial for a year at a time. He is also taking meloxicam prn and Aspercreme with lidocaine daily. He does a lot of heavy lifting. He has been evaluation by ortho in the past. He has also had gel knee injections int he past.    ROS As per HPI.      Objective:    BP 128/65   Pulse 82   Temp 98.3 F (36.8 C) (Temporal)   Ht 5\' 7"  (1.702 m)   Wt 246 lb (111.6 kg)   SpO2 97%   BMI 38.53 kg/m    Physical Exam Vitals and nursing note reviewed.  Constitutional:      Appearance: He is obese. He is not toxic-appearing or diaphoretic.  Pulmonary:     Effort: Pulmonary effort is normal. No respiratory distress.  Musculoskeletal:     Right knee: No swelling, effusion, erythema, ecchymosis or bony tenderness. Normal range of motion. Tenderness present over the medial joint line. No lateral joint line or patellar tendon tenderness. Normal alignment and normal patellar mobility.     Left knee: No swelling, effusion, erythema, ecchymosis or bony tenderness. Normal range of motion. Tenderness present over the medial joint line. No lateral joint line or patellar tendon tenderness. Normal alignment and normal patellar mobility.     Right lower leg: No edema.     Left lower leg: No edema.  Skin:    General: Skin is warm and dry.  Neurological:     General: No focal deficit present.     Mental Status: He is alert and oriented to person, place, and time.  Psychiatric:        Mood and Affect: Mood normal.        Behavior: Behavior normal.     Joint Injection/Arthrocentesis  Date/Time: 02/26/2023 3:25  PM  Performed by: Gabriel Earing, FNP Authorized by: Gabriel Earing, FNP  Indications: pain  Body area: knee Joint: right knee Local anesthesia used: yes  Anesthesia: Local anesthesia used: yes Local Anesthetic: topical anesthetic  Sedation: Patient sedated: no  Preparation: Patient was prepped and draped in the usual sterile fashion. Needle gauge: 25G. Ultrasound guidance: no Approach: anteriolateral. Aspirate amount: 0 mL Methylprednisolone amount: 80 mg Lidocaine 2% amount: 1 mL Patient tolerance: patient tolerated the procedure well with no immediate complications   Joint Injection/Arthrocentesis  Date/Time: 02/26/2023 3:33 PM  Performed by: Gabriel Earing, FNP Authorized by: Gabriel Earing, FNP  Indications: pain  Body area: knee Joint: left knee Local anesthesia used: yes  Anesthesia: Local anesthesia used: yes Local Anesthetic: topical anesthetic  Sedation: Patient sedated: no  Needle gauge: 25 G. Ultrasound guidance: no Approach: anteriolateral. Aspirate amount: 0 mL Methylprednisolone amount: 80 mg Lidocaine 2% amount: 1 mL Patient tolerance: patient tolerated the procedure well with no immediate complications     No results found for any visits on 02/26/23.      Assessment & Plan:   Steve Williams" was seen today for knee pain.  Diagnoses and all orders for this visit:  Bilateral primary  osteoarthritis of knee Bilateral joint injections today. RICE therapy, mobic, lidocaine. Return to office for new or worsening symptoms, or if symptoms persist.  -     methylPREDNISolone acetate (DEPO-MEDROL) injection 80 mg -     methylPREDNISolone acetate (DEPO-MEDROL) injection 80 mg -     Joint Injection/Arthrocentesis -     Joint Injection/Arthrocentesis  The patient indicates understanding of these issues and agrees with the plan.  Gabriel Earing, FNP

## 2023-03-07 ENCOUNTER — Other Ambulatory Visit: Payer: Self-pay | Admitting: Family Medicine

## 2023-03-29 ENCOUNTER — Encounter: Payer: Self-pay | Admitting: Family

## 2023-03-29 ENCOUNTER — Ambulatory Visit: Payer: Medicare Other | Admitting: Family

## 2023-03-29 ENCOUNTER — Telehealth: Payer: Self-pay | Admitting: Family Medicine

## 2023-03-29 VITALS — BP 118/66 | HR 93 | Temp 98.1°F | Ht 67.0 in | Wt 246.0 lb

## 2023-03-29 DIAGNOSIS — J209 Acute bronchitis, unspecified: Secondary | ICD-10-CM

## 2023-03-29 DIAGNOSIS — R051 Acute cough: Secondary | ICD-10-CM | POA: Diagnosis not present

## 2023-03-29 DIAGNOSIS — E1169 Type 2 diabetes mellitus with other specified complication: Secondary | ICD-10-CM

## 2023-03-29 LAB — VERITOR FLU A/B WAIVED
Influenza A: NEGATIVE
Influenza B: NEGATIVE

## 2023-03-29 MED ORDER — METHYLPREDNISOLONE ACETATE 80 MG/ML IJ SUSP
80.0000 mg | Freq: Once | INTRAMUSCULAR | Status: AC
Start: 2023-03-29 — End: 2023-03-29
  Administered 2023-03-29: 80 mg via INTRAMUSCULAR

## 2023-03-29 MED ORDER — MOLNUPIRAVIR EUA 200MG CAPSULE: 4.0000 | ORAL_CAPSULE | Freq: Two times a day (BID) | ORAL | 0 refills | Status: AC

## 2023-03-29 MED ORDER — PROMETHAZINE-DM 6.25-15 MG/5ML PO SYRP: 5.0000 mL | ORAL_SOLUTION | Freq: Three times a day (TID) | ORAL | 0 refills | Status: DC | PRN

## 2023-03-29 NOTE — Telephone Encounter (Signed)
Patient aware and verbalized understanding. °

## 2023-03-29 NOTE — Telephone Encounter (Signed)
Patient tested postive for covid at [facility], wants to know if PCP can call something in. Stated that paxlovid was discussed while he was in office.

## 2023-03-29 NOTE — Telephone Encounter (Signed)
Molnupiravir Prescription sent to pharmacy, Paxlovid had interactions with his medications.   Jannifer Rodney, FNP

## 2023-03-29 NOTE — Patient Instructions (Signed)

## 2023-03-29 NOTE — Progress Notes (Signed)
Subjective:    Patient ID: Steve Williams, male    DOB: [birth year], 67 y.o.   MRN: [id number]  Chief Complaint  Patient presents with   Cough   HEAD CONGESTION   Fever   PT presents to the office today with cough that started yesterday. He is a diabetic, but last A1C was 6.4. Cough This is a new problem. The current episode started yesterday. The problem has been unchanged. The problem occurs every few minutes. The cough is Productive of sputum. Associated symptoms include a fever, nasal congestion, postnasal drip and wheezing. Pertinent negatives include no chills, ear congestion, ear pain, headaches, myalgias, rhinorrhea, sore throat or shortness of breath. He has tried rest for the symptoms. The treatment provided mild relief. His past medical history is significant for bronchitis. There is no history of asthma or COPD.  Fever  Associated symptoms include coughing and wheezing. Pertinent negatives include no ear pain, headaches or sore throat.      Review of Systems  Constitutional:  Positive for fever. Negative for chills.  HENT:  Positive for postnasal drip. Negative for ear pain, rhinorrhea and sore throat.   Respiratory:  Positive for cough and wheezing. Negative for shortness of breath.   Musculoskeletal:  Negative for myalgias.  Neurological:  Negative for headaches.  All other systems reviewed and are negative.      Objective:   Physical Exam Vitals reviewed.  Constitutional:      General: He is not in acute distress.    Appearance: He is well-developed.  HENT:     Head: Normocephalic.     Right Ear: Tympanic membrane normal.     Left Ear: Tympanic membrane normal.  Eyes:     General:        Right eye: No discharge.        Left eye: No discharge.     Pupils: Pupils are equal, round, and reactive to light.  Neck:     Thyroid: No thyromegaly.  Cardiovascular:     Rate and Rhythm: Normal rate and regular rhythm.     Heart sounds: Normal heart sounds. No murmur  heard. Pulmonary:     Effort: Pulmonary effort is normal. No respiratory distress.     Breath sounds: Rhonchi present. No wheezing.  Abdominal:     General: Bowel sounds are normal. There is no distension.     Palpations: Abdomen is soft.     Tenderness: There is no abdominal tenderness.  Musculoskeletal:        General: No tenderness. Normal range of motion.     Cervical back: Normal range of motion and neck supple.  Skin:    General: Skin is warm and dry.     Findings: No erythema or rash.  Neurological:     Mental Status: He is alert and oriented to person, place, and time.     Cranial Nerves: No cranial nerve deficit.     Deep Tendon Reflexes: Reflexes are normal and symmetric.  Psychiatric:        Behavior: Behavior normal.        Thought Content: Thought content normal.        Judgment: Judgment normal.       BP 118/66   Pulse 93   Temp 98.1 F (36.7 C) (Temporal)   Ht 5\' 7"  (1.702 m)   Wt 246 lb (111.6 kg)   SpO2 95%   BMI 38.53 kg/m      Assessment & Plan:  Steve Williams comes in today with chief complaint of Cough, HEAD CONGESTION, and Fever   Diagnosis and orders addressed:  1. Acute cough - Veritor Flu A/B Waived - Novel Coronavirus, NAA ([facility])  2. Acute bronchitis, unspecified organism - Take meds as prescribed - Use a cool mist humidifier  -Use saline nose sprays frequently -Force fluids -For any cough or congestion  Use plain Mucinex- regular strength or max strength is fine -For fever or aces or pains- take tylenol or ibuprofen. -Throat lozenges if help -Follow up if symptoms worsen or do not improve  - methylPREDNISolone acetate (DEPO-MEDROL) injection 80 mg - promethazine-dextromethorphan (PROMETHAZINE-DM) 6.25-15 MG/5ML syrup; Take 5 mLs by mouth 3 (three) times daily as needed for cough.  Dispense: 118 mL; Refill: 0   3. Type 2 diabetes mellitus with other specified complication, without long-term current use of insulin  (HCC)   Low carb diet with steroid injection  Will take COVID test today and let us know Rest Force fluids Follow up if symptoms worsen or do not improve    Jannifer Rodney, FNP

## 2023-03-30 LAB — NOVEL CORONAVIRUS, NAA: SARS-CoV-2, NAA: DETECTED — AB

## 2023-05-09 ENCOUNTER — Encounter: Payer: Self-pay | Admitting: Family Medicine

## 2023-05-09 ENCOUNTER — Ambulatory Visit (INDEPENDENT_AMBULATORY_CARE_PROVIDER_SITE_OTHER): Payer: Medicare Other | Admitting: Family Medicine

## 2023-05-09 VITALS — BP 123/86 | HR 69 | Ht 67.0 in | Wt 244.0 lb

## 2023-05-09 DIAGNOSIS — E1159 Type 2 diabetes mellitus with other circulatory complications: Secondary | ICD-10-CM

## 2023-05-09 DIAGNOSIS — M170 Bilateral primary osteoarthritis of knee: Secondary | ICD-10-CM | POA: Diagnosis not present

## 2023-05-09 DIAGNOSIS — F39 Unspecified mood [affective] disorder: Secondary | ICD-10-CM

## 2023-05-09 DIAGNOSIS — J302 Other seasonal allergic rhinitis: Secondary | ICD-10-CM | POA: Diagnosis not present

## 2023-05-09 DIAGNOSIS — E785 Hyperlipidemia, unspecified: Secondary | ICD-10-CM | POA: Diagnosis not present

## 2023-05-09 DIAGNOSIS — I152 Hypertension secondary to endocrine disorders: Secondary | ICD-10-CM

## 2023-05-09 DIAGNOSIS — E291 Testicular hypofunction: Secondary | ICD-10-CM

## 2023-05-09 DIAGNOSIS — E1169 Type 2 diabetes mellitus with other specified complication: Secondary | ICD-10-CM | POA: Diagnosis not present

## 2023-05-09 LAB — BAYER DCA HB A1C WAIVED: HB A1C (BAYER DCA - WAIVED): 6.5 % — ABNORMAL HIGH (ref 4.8–5.6)

## 2023-05-09 MED ORDER — HYDROCHLOROTHIAZIDE 25 MG PO TABS
25.0000 mg | ORAL_TABLET | Freq: Every day | ORAL | 3 refills | Status: DC
Start: 2023-05-09 — End: 2024-05-14

## 2023-05-09 MED ORDER — MONTELUKAST SODIUM 10 MG PO TABS
10.0000 mg | ORAL_TABLET | Freq: Every day | ORAL | 3 refills | Status: DC
Start: 2023-05-09 — End: 2024-05-14

## 2023-05-09 MED ORDER — LOSARTAN POTASSIUM 100 MG PO TABS
100.0000 mg | ORAL_TABLET | Freq: Every day | ORAL | 3 refills | Status: DC
Start: 2023-05-09 — End: 2024-05-14

## 2023-05-09 MED ORDER — "SYRINGE/NEEDLE (DISP) 18G X 1"" 3 ML MISC": 6 refills | Status: AC

## 2023-05-09 MED ORDER — MELOXICAM 15 MG PO TABS
15.0000 mg | ORAL_TABLET | Freq: Every day | ORAL | 3 refills | Status: DC
Start: 2023-05-09 — End: 2024-05-14

## 2023-05-09 MED ORDER — SERTRALINE HCL 100 MG PO TABS
100.0000 mg | ORAL_TABLET | Freq: Every day | ORAL | 3 refills | Status: DC
Start: 2023-05-09 — End: 2024-05-14

## 2023-05-09 MED ORDER — SIMVASTATIN 40 MG PO TABS
40.0000 mg | ORAL_TABLET | Freq: Every day | ORAL | 3 refills | Status: DC
Start: 2023-05-09 — End: 2024-05-14

## 2023-05-09 MED ORDER — "BD LUER-LOK SYRINGE 22G X 1"" 3 ML MISC": 6 refills | Status: AC

## 2023-05-09 NOTE — Progress Notes (Signed)
BP 123/86   Pulse 69   Ht 5\' 7"  (1.702 m)   Wt 244 lb (110.7 kg)   SpO2 96%   BMI 38.22 kg/m    Subjective:   Patient ID: Steve Williams, male    DOB: 1955-10-23, 67 y.o.   MRN: [id number]  HPI: Steve Williams is a 67 y.o. male presenting on 05/09/2023 for Medical Management of Chronic Issues, Diabetes, and Hyperlipidemia   HPI Type 2 diabetes mellitus Patient comes in today for recheck of his diabetes. Patient has been currently taking no medicine, diet control. Patient is currently on an ACE inhibitor/ARB. Patient has not seen an ophthalmologist this year. Patient denies any new issues with their feet. The symptom started onset as an adult hypertension and hyperlipidemia ARE RELATED TO DM   Hypertension Patient is currently on losartan and hydrochlorothiazide, and their blood pressure today is 123/86. Patient denies any lightheadedness or dizziness. Patient denies headaches, blurred vision, chest pains, shortness of breath, or weakness. Denies any side effects from medication and is content with current medication.   Hyperlipidemia Patient is coming in for recheck of his hyperlipidemia. The patient is currently taking simvastatin. They deny any issues with myalgias or history of liver damage from it. They deny any focal numbness or weakness or chest pain.   Chronic allergy rhinitis recheck Patient takes loratadine and Singulair for chronic allergic rhinitis and also uses Flonase.  He feels like it keeps it mostly under control.  Mood disorder and anxiety recheck. Patient is coming in today for mood disorder and anxiety recheck.  He currently takes sertraline.  Feels like he is doing really well with mood and content with life    05/09/2023    8:04 AM 05/09/2023    8:03 AM 11/03/2022    8:18 AM 10/23/2022    8:16 AM 08/21/2022   11:14 AM  Depression screen PHQ 2/9  Decreased Interest  0 0 0 0  Down, Depressed, Hopeless  0 0 0 0  PHQ - 2 Score  0 0 0 0  Altered sleeping 0  0 0 0   Tired, decreased energy 0  0 0 0  Change in appetite 0  0 0 0  Feeling bad or failure about yourself  0  0 0 0  Trouble concentrating 0  0 0 0  Moving slowly or fidgety/restless 0  0 0 0  Suicidal thoughts 0  0 0 0  PHQ-9 Score   0 0 0  Difficult doing work/chores Not difficult at all  Not difficult at all Not difficult at all Not difficult at all     Relevant past medical, surgical, family and social history reviewed and updated as indicated. Interim medical history since our last visit reviewed. Allergies and medications reviewed and updated.  Review of Systems  Constitutional:  Negative for chills and fever.  Eyes:  Negative for visual disturbance.  Respiratory:  Negative for shortness of breath and wheezing.   Cardiovascular:  Negative for chest pain and leg swelling.  Musculoskeletal:  Negative for back pain and gait problem.  Skin:  Negative for rash.  Neurological:  Negative for dizziness, weakness and numbness.  All other systems reviewed and are negative.   Per HPI unless specifically indicated above   Allergies as of 05/09/2023       Reactions   Lisinopril    Erythromycin Rash        Medication List        Accurate as  of May 09, 2023  8:44 AM. If you have any questions, ask your nurse or doctor.          aspirin 81 MG tablet Take 81 mg by mouth daily.   B-D 3CC LUER-LOK SYR 22GX1" 22G X 1" 3 ML Misc Generic drug: SYRINGE-NEEDLE (DISP) 3 ML USE TO GIVE TESTOSTERONE   Syringe/Needle (Disp) 18G X 1" 3 ML Misc Use as directed Use to draw up testosterone   BD Disp Needles 18G X 1-1/2" Misc Generic drug: NEEDLE (DISP) 18 G USE TO draw UP testosterone   fluticasone 50 MCG/ACT nasal spray Commonly known as: FLONASE Place into both nostrils daily.   hydrochlorothiazide 25 MG tablet Commonly known as: HYDRODIURIL Take 1 tablet (25 mg total) by mouth daily.   loratadine 10 MG tablet Commonly known as: CLARITIN Take 10 mg by mouth daily.    losartan 100 MG tablet Commonly known as: COZAAR Take 1 tablet (100 mg total) by mouth daily.   meloxicam 15 MG tablet Commonly known as: MOBIC Take 1 tablet (15 mg total) by mouth daily.   montelukast 10 MG tablet Commonly known as: SINGULAIR Take 1 tablet (10 mg total) by mouth at bedtime.   promethazine-dextromethorphan 6.25-15 MG/5ML syrup Commonly known as: PROMETHAZINE-DM Take 5 mLs by mouth 3 (three) times daily as needed for cough.   sertraline 100 MG tablet Commonly known as: ZOLOFT Take 1 tablet (100 mg total) by mouth daily.   sildenafil 100 MG tablet Commonly known as: VIAGRA Take 1 tablet (100 mg total) by mouth daily as needed for erectile dysfunction.   sildenafil 20 MG tablet Commonly known as: REVATIO take 1 TO FIVE TABLETS BY MOUTH EVERY DAY AS NEEDED   simvastatin 40 MG tablet Commonly known as: ZOCOR Take 1 tablet (40 mg total) by mouth daily.   testosterone cypionate 200 MG/ML injection Commonly known as: DEPOTESTOSTERONE CYPIONATE INJECT 0.80ml INTRAMUSCULARLY EVERY 7 DAYS         Objective:   BP 123/86   Pulse 69   Ht 5\' 7"  (1.702 m)   Wt 244 lb (110.7 kg)   SpO2 96%   BMI 38.22 kg/m   Wt Readings from Last 3 Encounters:  05/09/23 244 lb (110.7 kg)  03/29/23 246 lb (111.6 kg)  02/26/23 246 lb (111.6 kg)    Physical Exam Vitals and nursing note reviewed.  Constitutional:      General: He is not in acute distress.    Appearance: He is well-developed. He is not diaphoretic.  Eyes:     General: No scleral icterus.       Right eye: No discharge.     Conjunctiva/sclera: Conjunctivae normal.     Pupils: Pupils are equal, round, and reactive to light.  Neck:     Thyroid: No thyromegaly.  Cardiovascular:     Rate and Rhythm: Normal rate and regular rhythm.     Heart sounds: Normal heart sounds. No murmur heard. Pulmonary:     Effort: Pulmonary effort is normal. No respiratory distress.     Breath sounds: Normal breath sounds. No  wheezing.  Musculoskeletal:        General: Normal range of motion.     Cervical back: Neck supple.  Lymphadenopathy:     Cervical: No cervical adenopathy.  Skin:    General: Skin is warm and dry.     Findings: No rash.  Neurological:     Mental Status: He is alert and oriented to person, place, and time.  Coordination: Coordination normal.  Psychiatric:        Behavior: Behavior normal.       Assessment & Plan:   Problem List Items Addressed This Visit       Cardiovascular and Mediastinum   Hypertension associated with diabetes (HCC)   Relevant Medications   losartan (COZAAR) 100 MG tablet   hydrochlorothiazide (HYDRODIURIL) 25 MG tablet   simvastatin (ZOCOR) 40 MG tablet   Other Relevant Orders   CBC with Differential/Platelet   CMP14+EGFR   Lipid panel   Bayer DCA Hb A1c Waived     Respiratory   Allergic rhinitis   Relevant Medications   montelukast (SINGULAIR) 10 MG tablet     Endocrine   Diabetes mellitus (HCC) - Primary   Relevant Medications   losartan (COZAAR) 100 MG tablet   hydrochlorothiazide (HYDRODIURIL) 25 MG tablet   simvastatin (ZOCOR) 40 MG tablet   Other Relevant Orders   CBC with Differential/Platelet   CMP14+EGFR   Lipid panel   Bayer DCA Hb A1c Waived   PSA, total and free   Hyperlipidemia associated with type 2 diabetes mellitus (HCC)   Relevant Medications   losartan (COZAAR) 100 MG tablet   hydrochlorothiazide (HYDRODIURIL) 25 MG tablet   simvastatin (ZOCOR) 40 MG tablet   Other Relevant Orders   CBC with Differential/Platelet   CMP14+EGFR   Lipid panel   Bayer DCA Hb A1c Waived   Hypogonadism in male   Relevant Medications   B-D 3CC LUER-LOK SYR 22GX1" 22G X 1" 3 ML MISC   Syringe/Needle, Disp, 18G X 1" 3 ML MISC     Other   Mood disorder (HCC)   Relevant Medications   sertraline (ZOLOFT) 100 MG tablet   Other Visit Diagnoses     Bilateral primary osteoarthritis of knee       Relevant Medications   meloxicam  (MOBIC) 15 MG tablet       Continue current medicine, seems to be doing well.  No changes Follow up plan: Return in about 6 months (around 11/07/2023), or if symptoms worsen or fail to improve, for Diabetes recheck.  Counseling provided for all of the vaccine components Orders Placed This Encounter  Procedures   CBC with Differential/Platelet   CMP14+EGFR   Lipid panel   Bayer DCA Hb A1c Waived   PSA, total and free    Arville Care, MD [facility] 05/09/2023, 8:44 AM

## 2023-05-10 LAB — PSA, TOTAL AND FREE
PSA, Free Pct: 15.5 %
PSA, Free: 0.31 ng/mL
Prostate Specific Ag, Serum: 2 ng/mL (ref 0.0–4.0)

## 2023-05-10 LAB — CBC WITH DIFFERENTIAL/PLATELET
Basophils Absolute: 0.1 10*3/uL (ref 0.0–0.2)
Basos: 1 %
EOS (ABSOLUTE): 0.1 10*3/uL (ref 0.0–0.4)
Eos: 2 %
Hematocrit: 50.3 % (ref 37.5–51.0)
Hemoglobin: 17.1 g/dL (ref 13.0–17.7)
Immature Grans (Abs): 0.1 10*3/uL (ref 0.0–0.1)
Immature Granulocytes: 1 %
Lymphocytes Absolute: 1.3 10*3/uL (ref 0.7–3.1)
Lymphs: 22 %
MCH: 31 pg (ref 26.6–33.0)
MCHC: 34 g/dL (ref 31.5–35.7)
MCV: 91 fL (ref 79–97)
Monocytes Absolute: 0.6 10*3/uL (ref 0.1–0.9)
Monocytes: 9 %
Neutrophils Absolute: 3.8 10*3/uL (ref 1.4–7.0)
Neutrophils: 65 %
Platelets: 98 10*3/uL — CL (ref 150–450)
RBC: 5.52 x10E6/uL (ref 4.14–5.80)
RDW: 13.1 % (ref 11.6–15.4)
WBC: 5.9 10*3/uL (ref 3.4–10.8)

## 2023-05-10 LAB — LIPID PANEL
Chol/HDL Ratio: 3.6 {ratio} (ref 0.0–5.0)
Cholesterol, Total: 133 mg/dL (ref 100–199)
HDL: 37 mg/dL — ABNORMAL LOW (ref >39)
LDL Chol Calc (NIH): 66 mg/dL (ref 0–99)
Triglycerides: 176 mg/dL — ABNORMAL HIGH (ref 0–149)
VLDL Cholesterol Cal: 30 mg/dL (ref 5–40)

## 2023-05-10 LAB — CMP14+EGFR
ALT: 16 [IU]/L (ref 0–44)
AST: 14 [IU]/L (ref 0–40)
Albumin: 4.1 g/dL (ref 3.9–4.9)
Alkaline Phosphatase: 77 [IU]/L (ref 44–121)
BUN/Creatinine Ratio: 20 (ref 10–24)
BUN: 17 mg/dL (ref 8–27)
Bilirubin Total: 0.7 mg/dL (ref 0.0–1.2)
CO2: 24 mmol/L (ref 20–29)
Calcium: 8.8 mg/dL (ref 8.6–10.2)
Chloride: 101 mmol/L (ref 96–106)
Creatinine, Ser: 0.83 mg/dL (ref 0.76–1.27)
Globulin, Total: 1.8 g/dL (ref 1.5–4.5)
Glucose: 142 mg/dL — ABNORMAL HIGH (ref 70–99)
Potassium: 4 mmol/L (ref 3.5–5.2)
Sodium: 139 mmol/L (ref 134–144)
Total Protein: 5.9 g/dL — ABNORMAL LOW (ref 6.0–8.5)
eGFR: 95 mL/min/{1.73_m2} (ref >59)

## 2023-05-24 ENCOUNTER — Other Ambulatory Visit: Payer: Self-pay | Admitting: Urology

## 2023-05-24 DIAGNOSIS — E291 Testicular hypofunction: Secondary | ICD-10-CM

## 2023-05-28 ENCOUNTER — Other Ambulatory Visit: Payer: Self-pay | Admitting: Family Medicine

## 2023-06-01 NOTE — Telephone Encounter (Signed)
Per patient you rx'd him to take half a ml of 200 testosterone. Pharmacy could only get the 100 testosterone and told pt could take 1 ml.  So he only has 1 dose left which is why he needs a refill.  He states will discuss this at his upcoming appt with you next week.

## 2023-06-04 ENCOUNTER — Telehealth: Payer: Self-pay | Admitting: Urology

## 2023-06-04 NOTE — Telephone Encounter (Signed)
Needs refill of testosterone cypionate (DEPOTESTOSTERONE CYPIONATE) 200 MG/ML injection [[id number]]  to [facility]

## 2023-06-05 ENCOUNTER — Encounter: Payer: Self-pay | Admitting: Urology

## 2023-06-05 ENCOUNTER — Ambulatory Visit: Payer: Medicare Other | Admitting: Urology

## 2023-06-05 ENCOUNTER — Encounter: Payer: Self-pay | Admitting: Nurse Practitioner

## 2023-06-05 ENCOUNTER — Ambulatory Visit (INDEPENDENT_AMBULATORY_CARE_PROVIDER_SITE_OTHER): Payer: Medicare Other | Admitting: Nurse Practitioner

## 2023-06-05 VITALS — BP 162/87 | HR 76 | Ht 67.0 in | Wt 248.0 lb

## 2023-06-05 VITALS — BP 155/82 | HR 72 | Temp 98.1°F | Resp 20 | Ht 67.0 in | Wt 248.0 lb

## 2023-06-05 DIAGNOSIS — M25561 Pain in right knee: Secondary | ICD-10-CM | POA: Diagnosis not present

## 2023-06-05 DIAGNOSIS — M25562 Pain in left knee: Secondary | ICD-10-CM | POA: Diagnosis not present

## 2023-06-05 DIAGNOSIS — G8929 Other chronic pain: Secondary | ICD-10-CM

## 2023-06-05 DIAGNOSIS — N5201 Erectile dysfunction due to arterial insufficiency: Secondary | ICD-10-CM

## 2023-06-05 DIAGNOSIS — E291 Testicular hypofunction: Secondary | ICD-10-CM

## 2023-06-05 DIAGNOSIS — R350 Frequency of micturition: Secondary | ICD-10-CM | POA: Diagnosis not present

## 2023-06-05 DIAGNOSIS — R3915 Urgency of urination: Secondary | ICD-10-CM | POA: Diagnosis not present

## 2023-06-05 LAB — URINALYSIS, ROUTINE W REFLEX MICROSCOPIC
Bilirubin, UA: NEGATIVE
Ketones, UA: NEGATIVE
Leukocytes,UA: NEGATIVE
Nitrite, UA: NEGATIVE
Protein,UA: NEGATIVE
RBC, UA: NEGATIVE
Specific Gravity, UA: 1.015 (ref 1.005–1.030)
Urobilinogen, Ur: 1 mg/dL (ref 0.2–1.0)
pH, UA: 6 (ref 5.0–7.5)

## 2023-06-05 MED ORDER — METHYLPREDNISOLONE ACETATE 40 MG/ML IJ SUSP
40.0000 mg | Freq: Once | INTRAMUSCULAR | Status: AC
  Administered 2023-06-05: 40 mg via INTRAMUSCULAR

## 2023-06-05 MED ORDER — BUPIVACAINE HCL 0.25 % IJ SOLN
1.0000 mL | Freq: Once | INTRAMUSCULAR | Status: AC
  Administered 2023-06-05: 1 mL via INTRA_ARTICULAR

## 2023-06-05 MED ORDER — TADALAFIL 5 MG PO TABS: 5.0000 mg | ORAL_TABLET | Freq: Every day | ORAL | 0 refills | Status: DC | PRN

## 2023-06-05 NOTE — Progress Notes (Signed)
History of Present Illness:   He is on 100 mg of testosterone cypionate, administered every Sunday.  Physical sildenafil on a as needed basis for ED.  He has not had testosterone level drawn for a year.  Most recent hemoglobin within the past month was 17.1.   Past Medical History:  Diagnosis Date   Allergy    Anxiety    Arthritis    Asthma    Cancer (HCC)    Depression    Diabetes mellitus without complication (HCC)    Hyperlipidemia    Hypertension     Past Surgical History:  Procedure Laterality Date   CARPAL BONE EXCISION Bilateral    KNEE ARTHROSCOPY Left    KNEE SURGERY Left 1974   lipoma removed     TRIGGER FINGER RELEASE Right     Home Medications:  Allergies as of 06/05/2023       Reactions   Lisinopril    Erythromycin Rash        Medication List        Accurate as of June 05, 2023  7:07 AM. If you have any questions, ask your nurse or doctor.          aspirin 81 MG tablet Take 81 mg by mouth daily.   B-D 3CC LUER-LOK SYR 22GX1" 22G X 1" 3 ML Misc Generic drug: SYRINGE-NEEDLE (DISP) 3 ML USE TO GIVE TESTOSTERONE   Syringe/Needle (Disp) 18G X 1" 3 ML Misc Use as directed Use to draw up testosterone   BD Disp Needles 18G X 1-1/2" Misc Generic drug: NEEDLE (DISP) 18 G USE TO draw UP testosterone   fluticasone 50 MCG/ACT nasal spray Commonly known as: FLONASE Place into both nostrils daily.   hydrochlorothiazide 25 MG tablet Commonly known as: HYDRODIURIL Take 1 tablet (25 mg total) by mouth daily.   loratadine 10 MG tablet Commonly known as: CLARITIN Take 10 mg by mouth daily.   losartan 100 MG tablet Commonly known as: COZAAR Take 1 tablet (100 mg total) by mouth daily.   meloxicam 15 MG tablet Commonly known as: MOBIC Take 1 tablet (15 mg total) by mouth daily.   montelukast 10 MG tablet Commonly known as: SINGULAIR Take 1 tablet (10 mg total) by mouth at bedtime.   promethazine-dextromethorphan 6.25-15 MG/5ML  syrup Commonly known as: PROMETHAZINE-DM Take 5 mLs by mouth 3 (three) times daily as needed for cough.   sertraline 100 MG tablet Commonly known as: ZOLOFT Take 1 tablet (100 mg total) by mouth daily.   sildenafil 100 MG tablet Commonly known as: VIAGRA Take 1 tablet (100 mg total) by mouth daily as needed for erectile dysfunction.   sildenafil 20 MG tablet Commonly known as: REVATIO take 1 TO 5 TABLETS BY MOUTH EVERY DAY AS NEEDED   simvastatin 40 MG tablet Commonly known as: ZOCOR Take 1 tablet (40 mg total) by mouth daily.   testosterone cypionate 200 MG/ML injection Commonly known as: DEPOTESTOSTERONE CYPIONATE INJECT 0.27ml INTRAMUSCULARLY EVERY 7 DAYS        Allergies:  Allergies  Allergen Reactions   Lisinopril    Erythromycin Rash    Family History  Problem Relation Age of Onset   Heart disease Mother    Hypertension Mother    Diabetes Mother    Heart disease Father    Cancer Father        liver   Diabetes Father    Hypertension Sister    Diabetes Sister    Migraines Son  Social History:  reports that he has never smoked. He has never used smokeless tobacco. He reports that he does not drink alcohol and does not use drugs.  ROS: A complete review of systems was performed.  All systems are negative except for pertinent findings as noted.  Physical Exam:  Vital signs in last 24 hours: There were no vitals taken for this visit. Constitutional:  Alert and oriented, No acute distress Cardiovascular: Regular rate  Respiratory: Normal respiratory effort Genitourinary: Phallus is circumcised.  Testicles atrophic.  No inguinal hernias.  Prostate 40 g, symmetric, nonnodular, nontender. Lymphatic: No lymphadenopathy Neurologic: Grossly intact, no focal deficits Psychiatric: Normal mood and affect  I have reviewed prior pt notes  I have reviewed urinalysis results  I have reviewed prior PSA and testosterone results  IPSS  14/3     Impression/Assessment:  1.  Hypogonadism on 100 mg of testosterone cypionate weekly.  This treats his symptoms fairly well  2.  ED, on 100 mg of sildenafil with mixed results  3.  Overactive bladder symptoms  Plan:   1.  I will check appropriate laboratories today.  This will be a "peak" level  2.  I will see back in 1 year for recheck  3.  I put him on daily Cialis to help with LUTS as well as his ED.

## 2023-06-05 NOTE — Patient Instructions (Signed)
Sacroiliac (SI) Joint Injection Patient Information  Description: The sacroiliac joint connects the scrum (very low back and tailbone) to the ilium (a pelvic bone which also forms half of the hip joint).  Normally this joint experiences very little motion.  When this joint becomes inflamed or unstable low back and or hip and pelvis pain may result.  Injection of this joint with local anesthetics (numbing medicines) and steroids can provide diagnostic information and reduce pain.  This injection is performed with the aid of x-ray guidance into the tailbone area while you are lying on your stomach.   You may experience an electrical sensation down the leg while this is being done.  You may also experience numbness.  We also may ask if we are reproducing your normal pain during the injection.  Conditions which may be treated SI injection:  Low back, buttock, hip or leg pain  Preparation for the Injection:  Do not eat any solid food or dairy products within 8 hours of your appointment.  You may drink clear liquids up to 3 hours before appointment.  Clear liquids include water, black coffee, juice or soda.  No milk or cream please. You may take your regular medications, including pain medications with a sip of water before your appointment.  Diabetics should hold regular insulin (if take separately) and take 1/2 normal NPH dose the morning of the procedure.  Carry some sugar containing items with you to your appointment. A driver must accompany you and be prepared to drive you home after your procedure. Bring all of your current medications with you. An IV may be inserted and sedation may be given at the discretion of the physician. A blood pressure cuff, EKG and other monitors will often be applied during the procedure.  Some patients may need to have extra oxygen administered for a short period.  You will be asked to provide medical information, including your allergies, prior to the procedure.  We  must know immediately if you are taking blood thinners (like Coumadin/Warfarin) or if you are allergic to IV iodine contrast (dye).  We must know if you could possible be pregnant.  Possible side effects:  Bleeding from needle site Infection (rare, may require surgery) Nerve injury (rare) Numbness & tingling (temporary) A brief convulsion or seizure Light-headedness (temporary) Pain at injection site (several days) Decreased blood pressure (temporary) Weakness in the leg (temporary)   Call if you experience:  New onset weakness or numbness of an extremity below the injection site that last more than 8 hours. Hives or difficulty breathing ( go to the emergency room) Inflammation or drainage at the injection site Any new symptoms which are concerning to you  Please note:  Although the local anesthetic injected can often make your back/ hip/ buttock/ leg feel good for several hours after the injections, the pain will likely return.  It takes 3-7 days for steroids to work in the sacroiliac area.  You may not notice any pain relief for at least that one week.  If effective, we will often do a series of three injections spaced 3-6 weeks apart to maximally decrease your pain.  After the initial series, we generally will wait some months before a repeat injection of the same type.  If you have any questions, please call [phone] [facility]   

## 2023-06-05 NOTE — Telephone Encounter (Signed)
MD ordered today 11/05

## 2023-06-05 NOTE — Progress Notes (Signed)
Subjective:    Patient ID: Steve Williams, male    DOB: March 04, 1956, 67 y.o.   MRN: [id number]   Chief Complaint: bilateral knee pain   Knee Pain  There was no injury mechanism. The pain is present in the right knee and left knee. The quality of the pain is described as aching. The pain is at a severity of 8/10. The pain is moderate. The pain has been Constant since onset. Associated symptoms include an inability to bear weight. Pertinent negatives include no loss of motion. The symptoms are aggravated by movement and weight bearing. He has tried acetaminophen for the symptoms. The treatment provided mild relief.    Patient Active Problem List   Diagnosis Date Noted  . Morbid obesity (HCC) 02/27/2018  . OSA on CPAP 01/27/2016  . History of melanoma 07/13/2015  . Diabetes mellitus (HCC) 06/21/2015  . Hypertension associated with diabetes (HCC) 06/21/2015  . Hypogonadism in male 06/21/2015  . Allergic rhinitis 06/21/2015  . Hyperlipidemia associated with type 2 diabetes mellitus (HCC) 06/21/2015  . Mood disorder (HCC) 06/21/2015       Review of Systems  Musculoskeletal:  Positive for arthralgias and joint swelling.       Objective:   Physical Exam Constitutional:      Appearance: Normal appearance. He is obese.  Cardiovascular:     Rate and Rhythm: Normal rate and regular rhythm.     Heart sounds: Normal heart sounds.  Pulmonary:     Effort: Pulmonary effort is normal.     Breath sounds: Normal breath sounds.  Musculoskeletal:     Comments: FROM of bil knees with crepitus on flexion and extension Mild effusion bil No patella tenderness  Skin:    General: Skin is warm.  Neurological:     General: No focal deficit present.     Mental Status: He is alert and oriented to person, place, and time.    BP (!) 155/82   Pulse 72   Temp 98.1 F (36.7 C) (Temporal)   Resp 20   Ht 5\' 7"  (1.702 m)   Wt 248 lb (112.5 kg)   SpO2 96%   BMI 38.84 kg/m   Joint  Injection/Arthrocentesis  Date/Time: 06/05/2023 11:28 AM  Performed by: Bennie Pierini, FNP Authorized by: Daphine Deutscher, Mary-Margaret, FNP  Indications: joint swelling and pain  Body area: knee Joint: left knee Local anesthesia used: no  Anesthesia: Local anesthesia used: no  Sedation: Patient sedated: no  Preparation: Patient was prepped and draped in the usual sterile fashion. Needle size: 22 G Ultrasound guidance: no Approach: superior Methylprednisolone amount: 40 mg Bupivacaine 0.25% amount: 1 mL Patient tolerance: patient tolerated the procedure well with no immediate complications   Joint Injection/Arthrocentesis  Date/Time: 06/05/2023 11:29 AM  Performed by: Bennie Pierini, FNP Authorized by: Daphine Deutscher Mary-Margaret, FNP  Indications: joint swelling and pain  Body area: knee Joint: right knee Local anesthesia used: no  Anesthesia: Local anesthesia used: no  Sedation: Patient sedated: no  Preparation: Patient was prepped and draped in the usual sterile fashion. Needle size: 22 G Approach: superior Methylprednisolone amount: 40 mg Bupivacaine 0.25% amount: 1 mL Patient tolerance: patient tolerated the procedure well with no immediate complications         Assessment & Plan:   [name] in today with chief complaint of bilateral knee pain   1. Chronic pain of both knees Ice Rest  RTO prn    The above assessment and management plan was discussed with  the patient. The patient verbalized understanding of and has agreed to the management plan. Patient is aware to call the clinic if symptoms persist or worsen. Patient is aware when to return to the clinic for a follow-up visit. Patient educated on when it is appropriate to go to the emergency department.   Mary-Margaret Daphine Deutscher, FNP

## 2023-06-06 LAB — TESTOSTERONE: Testosterone: 749 ng/dL (ref 264–916)

## 2023-06-20 DIAGNOSIS — R030 Elevated blood-pressure reading, without diagnosis of hypertension: Secondary | ICD-10-CM | POA: Diagnosis not present

## 2023-06-20 DIAGNOSIS — S61204A Unspecified open wound of right ring finger without damage to nail, initial encounter: Secondary | ICD-10-CM | POA: Diagnosis not present

## 2023-07-10 ENCOUNTER — Ambulatory Visit: Payer: Medicare Other | Admitting: Urology

## 2023-07-12 ENCOUNTER — Encounter: Payer: Self-pay | Admitting: Family Medicine

## 2023-07-23 ENCOUNTER — Encounter (HOSPITAL_COMMUNITY): Payer: Self-pay

## 2023-07-23 ENCOUNTER — Emergency Department (HOSPITAL_COMMUNITY)
Admission: EM | Admit: 2023-07-23 | Discharge: 2023-07-23 | Disposition: A | Discharge status: Home / Self Care | Payer: Medicare Other | Source: Home / Self Care | Attending: Emergency Medicine | Admitting: Emergency Medicine

## 2023-07-23 ENCOUNTER — Other Ambulatory Visit: Payer: Self-pay

## 2023-07-23 DIAGNOSIS — Z833 Family history of diabetes mellitus: Secondary | ICD-10-CM | POA: Diagnosis not present

## 2023-07-23 DIAGNOSIS — Z79899 Other long term (current) drug therapy: Secondary | ICD-10-CM | POA: Diagnosis not present

## 2023-07-23 DIAGNOSIS — I152 Hypertension secondary to endocrine disorders: Secondary | ICD-10-CM | POA: Diagnosis not present

## 2023-07-23 DIAGNOSIS — R220 Localized swelling, mass and lump, head: Secondary | ICD-10-CM | POA: Diagnosis not present

## 2023-07-23 DIAGNOSIS — Z8249 Family history of ischemic heart disease and other diseases of the circulatory system: Secondary | ICD-10-CM | POA: Diagnosis not present

## 2023-07-23 DIAGNOSIS — E1169 Type 2 diabetes mellitus with other specified complication: Secondary | ICD-10-CM | POA: Diagnosis not present

## 2023-07-23 DIAGNOSIS — Z791 Long term (current) use of non-steroidal anti-inflammatories (NSAID): Secondary | ICD-10-CM | POA: Diagnosis not present

## 2023-07-23 DIAGNOSIS — L0201 Cutaneous abscess of face: Secondary | ICD-10-CM | POA: Insufficient documentation

## 2023-07-23 DIAGNOSIS — L03211 Cellulitis of face: Secondary | ICD-10-CM | POA: Diagnosis not present

## 2023-07-23 DIAGNOSIS — J309 Allergic rhinitis, unspecified: Secondary | ICD-10-CM | POA: Diagnosis not present

## 2023-07-23 DIAGNOSIS — Z7982 Long term (current) use of aspirin: Secondary | ICD-10-CM | POA: Insufficient documentation

## 2023-07-23 DIAGNOSIS — Z888 Allergy status to other drugs, medicaments and biological substances status: Secondary | ICD-10-CM | POA: Diagnosis not present

## 2023-07-23 DIAGNOSIS — L0390 Cellulitis, unspecified: Secondary | ICD-10-CM | POA: Diagnosis not present

## 2023-07-23 DIAGNOSIS — Z881 Allergy status to other antibiotic agents status: Secondary | ICD-10-CM | POA: Diagnosis not present

## 2023-07-23 MED ORDER — SULFAMETHOXAZOLE-TRIMETHOPRIM 800-160 MG PO TABS: 1.0000 | ORAL_TABLET | Freq: Two times a day (BID) | ORAL | 0 refills | Status: DC

## 2023-07-23 MED ORDER — SULFAMETHOXAZOLE-TRIMETHOPRIM 800-160 MG PO TABS
1.0000 | ORAL_TABLET | Freq: Once | ORAL | Status: AC
  Administered 2023-07-23: 1 via ORAL
  Filled 2023-07-23: qty 1

## 2023-07-23 NOTE — ED Provider Notes (Signed)
[place] EMERGENCY DEPARTMENT AT [facility] Provider Note   CSN: [id number] Arrival date & time: 07/23/23  4098     History  Chief Complaint  Patient presents with   Facial Swelling    Steve Williams is a 67 y.o. male.  Presents to the department for evaluation of pain and swelling around the right nare.  He reports that he noticed some swelling inside the nostril and squeezed the area, got some pus out.  This occurred last night, now the area is becoming more swollen.       Home Medications Prior to Admission medications   Medication Sig Start Date End Date Taking? Authorizing Provider  sulfamethoxazole-trimethoprim (BACTRIM DS) 800-160 MG tablet Take 1 tablet by mouth 2 (two) times daily. 07/23/23  Yes Diahn Waidelich, Canary Brim, MD  aspirin 81 MG tablet Take 81 mg by mouth daily.    [provider]  B-D 3CC LUER-LOK SYR 22GX1" 22G X 1" 3 ML MISC USE TO GIVE TESTOSTERONE 05/09/23   Dettinger, Elige Radon, MD  BD DISP NEEDLES 18G X 1-1/2" MISC USE TO draw UP testosterone 07/18/22   Marcine Matar, MD  fluticasone (FLONASE) 50 MCG/ACT nasal spray Place into both nostrils daily.    [provider]  hydrochlorothiazide (HYDRODIURIL) 25 MG tablet Take 1 tablet (25 mg total) by mouth daily. 05/09/23   Dettinger, Elige Radon, MD  loratadine (CLARITIN) 10 MG tablet Take 10 mg by mouth daily.    [provider]  losartan (COZAAR) 100 MG tablet Take 1 tablet (100 mg total) by mouth daily. 05/09/23   Dettinger, Elige Radon, MD  meloxicam (MOBIC) 15 MG tablet Take 1 tablet (15 mg total) by mouth daily. 05/09/23   Dettinger, Elige Radon, MD  montelukast (SINGULAIR) 10 MG tablet Take 1 tablet (10 mg total) by mouth at bedtime. 05/09/23   Dettinger, Elige Radon, MD  promethazine-dextromethorphan (PROMETHAZINE-DM) 6.25-15 MG/5ML syrup Take 5 mLs by mouth 3 (three) times daily as needed for cough. Patient not taking: Reported on 06/05/2023 03/29/23   Jannifer Rodney A, FNP   sertraline (ZOLOFT) 100 MG tablet Take 1 tablet (100 mg total) by mouth daily. 05/09/23   Dettinger, Elige Radon, MD  sildenafil (REVATIO) 20 MG tablet take 1 TO 5 TABLETS BY MOUTH EVERY DAY AS NEEDED 05/28/23   Dettinger, Elige Radon, MD  sildenafil (VIAGRA) 100 MG tablet Take 1 tablet (100 mg total) by mouth daily as needed for erectile dysfunction. 06/06/22   Marcine Matar, MD  simvastatin (ZOCOR) 40 MG tablet Take 1 tablet (40 mg total) by mouth daily. 05/09/23   Dettinger, Elige Radon, MD  Syringe/Needle, Disp, 18G X 1" 3 ML MISC Use as directed Use to draw up testosterone 05/09/23   Dettinger, Elige Radon, MD  tadalafil (CIALIS) 5 MG tablet Take 1 tablet (5 mg total) by mouth daily as needed for erectile dysfunction. 06/05/23   Marcine Matar, MD  testosterone cypionate (DEPOTESTOTERONE CYPIONATE) 100 MG/ML injection INJECT 1 ML INTRAMUSCULARLY EVERY 7 DAYS 06/05/23   Marcine Matar, MD      Allergies    Lisinopril and Erythromycin    Review of Systems   Review of Systems  Physical Exam Updated Vital Signs BP (!) 148/78   Pulse 96   Temp 98.5 F (36.9 C) (Oral)   Resp 18   Ht 5\' 7"  (1.702 m)   Wt 113 kg   SpO2 96%   BMI 39.02 kg/m  Physical Exam Vitals and nursing note reviewed.  Constitutional:      General: He is not in acute distress.    Appearance: He is well-developed.  HENT:     Head: Normocephalic and atraumatic.     Nose:      Mouth/Throat:     Mouth: Mucous membranes are moist.  Eyes:     General: Vision grossly intact. Gaze aligned appropriately.     Extraocular Movements: Extraocular movements intact.     Conjunctiva/sclera: Conjunctivae normal.  Cardiovascular:     Rate and Rhythm: Normal rate and regular rhythm.     Pulses: Normal pulses.     Heart sounds: Normal heart sounds, S1 normal and S2 normal. No murmur heard.    No friction rub. No gallop.  Pulmonary:     Effort: Pulmonary effort is normal. No respiratory distress.     Breath sounds: Normal  breath sounds.  Abdominal:     Palpations: Abdomen is soft.     Tenderness: There is no abdominal tenderness. There is no guarding or rebound.     Hernia: No hernia is present.  Musculoskeletal:        General: No swelling.     Cervical back: Full passive range of motion without pain, normal range of motion and neck supple. No pain with movement, spinous process tenderness or muscular tenderness. Normal range of motion.     Right lower leg: No edema.     Left lower leg: No edema.  Skin:    General: Skin is warm and dry.     Capillary Refill: Capillary refill takes less than 2 seconds.     Findings: No ecchymosis, erythema, lesion or wound.  Neurological:     Mental Status: He is alert and oriented to person, place, and time.     GCS: GCS eye subscore is 4. GCS verbal subscore is 5. GCS motor subscore is 6.     Cranial Nerves: Cranial nerves 2-12 are intact.     Sensory: Sensation is intact.     Motor: Motor function is intact. No weakness or abnormal muscle tone.     Coordination: Coordination is intact.  Psychiatric:        Mood and Affect: Mood normal.        Speech: Speech normal.        Behavior: Behavior normal.     ED Results / Procedures / Treatments   Labs (all labs ordered are listed, but only abnormal results are displayed) Labs Reviewed - No data to display  EKG None  Radiology No results found.  Procedures Procedures    Medications Ordered in ED Medications  sulfamethoxazole-trimethoprim (BACTRIM DS) 800-160 MG per tablet 1 tablet (has no administration in time range)    ED Course/ Medical Decision Making/ A&P                                 Medical Decision Making Risk Prescription drug management.   Examination reveals what appears to be an early abscess inside the right nare.  There is some slight induration and erythema but no obvious fluctuance or area to drain at this point.  Initiate antibiotic coverage, given return  precautions.        Final Clinical Impression(s) / ED Diagnoses Final diagnoses:  Facial abscess    Rx / DC Orders ED Discharge Orders          Ordered    sulfamethoxazole-trimethoprim (BACTRIM DS) 800-160 MG tablet  2 times daily        07/23/23 0352              Gilda Crease, MD 07/23/23 [phone]

## 2023-07-23 NOTE — ED Triage Notes (Signed)
Pt arrived via POV c/o facial swelling. Pt reports he "popped" something "like a pimple" in his right nare yesterday and reports he was able to get "puss" to drain. Pt then noticed swelling began last night and progressively worsened.

## 2023-07-25 ENCOUNTER — Emergency Department (HOSPITAL_COMMUNITY): Payer: Medicare Other

## 2023-07-25 ENCOUNTER — Encounter (HOSPITAL_COMMUNITY): Payer: Self-pay

## 2023-07-25 ENCOUNTER — Inpatient Hospital Stay (HOSPITAL_COMMUNITY)
Admission: EM | Admit: 2023-07-25 | Discharge: 2023-07-26 | DRG: 603 | Disposition: A | Discharge status: Home / Self Care | Payer: Medicare Other | Attending: Family Medicine | Admitting: Family Medicine

## 2023-07-25 DIAGNOSIS — Z7982 Long term (current) use of aspirin: Secondary | ICD-10-CM | POA: Diagnosis not present

## 2023-07-25 DIAGNOSIS — L03211 Cellulitis of face: Secondary | ICD-10-CM | POA: Diagnosis not present

## 2023-07-25 DIAGNOSIS — L0201 Cutaneous abscess of face: Secondary | ICD-10-CM | POA: Diagnosis not present

## 2023-07-25 DIAGNOSIS — Z833 Family history of diabetes mellitus: Secondary | ICD-10-CM | POA: Diagnosis not present

## 2023-07-25 DIAGNOSIS — Z791 Long term (current) use of non-steroidal anti-inflammatories (NSAID): Secondary | ICD-10-CM

## 2023-07-25 DIAGNOSIS — Z79899 Other long term (current) drug therapy: Secondary | ICD-10-CM

## 2023-07-25 DIAGNOSIS — Z8249 Family history of ischemic heart disease and other diseases of the circulatory system: Secondary | ICD-10-CM | POA: Diagnosis not present

## 2023-07-25 DIAGNOSIS — J309 Allergic rhinitis, unspecified: Secondary | ICD-10-CM | POA: Diagnosis present

## 2023-07-25 DIAGNOSIS — E1159 Type 2 diabetes mellitus with other circulatory complications: Secondary | ICD-10-CM | POA: Diagnosis present

## 2023-07-25 DIAGNOSIS — Z888 Allergy status to other drugs, medicaments and biological substances status: Secondary | ICD-10-CM

## 2023-07-25 DIAGNOSIS — L0390 Cellulitis, unspecified: Secondary | ICD-10-CM | POA: Diagnosis present

## 2023-07-25 DIAGNOSIS — F419 Anxiety disorder, unspecified: Secondary | ICD-10-CM | POA: Diagnosis present

## 2023-07-25 DIAGNOSIS — F32A Depression, unspecified: Secondary | ICD-10-CM | POA: Diagnosis present

## 2023-07-25 DIAGNOSIS — E119 Type 2 diabetes mellitus without complications: Secondary | ICD-10-CM

## 2023-07-25 DIAGNOSIS — Z881 Allergy status to other antibiotic agents status: Secondary | ICD-10-CM | POA: Diagnosis not present

## 2023-07-25 DIAGNOSIS — E1169 Type 2 diabetes mellitus with other specified complication: Secondary | ICD-10-CM | POA: Diagnosis present

## 2023-07-25 DIAGNOSIS — E291 Testicular hypofunction: Secondary | ICD-10-CM | POA: Diagnosis present

## 2023-07-25 DIAGNOSIS — R220 Localized swelling, mass and lump, head: Secondary | ICD-10-CM | POA: Diagnosis not present

## 2023-07-25 DIAGNOSIS — I152 Hypertension secondary to endocrine disorders: Secondary | ICD-10-CM | POA: Diagnosis present

## 2023-07-25 DIAGNOSIS — E785 Hyperlipidemia, unspecified: Secondary | ICD-10-CM | POA: Diagnosis present

## 2023-07-25 LAB — CBC WITH DIFFERENTIAL/PLATELET
Abs Immature Granulocytes: 0.09 10*3/uL — ABNORMAL HIGH (ref 0.00–0.07)
Basophils Absolute: 0.1 10*3/uL (ref 0.0–0.1)
Basophils Relative: 1 %
Eosinophils Absolute: 0.1 10*3/uL (ref 0.0–0.5)
Eosinophils Relative: 1 %
HCT: 51.6 % (ref 39.0–52.0)
Hemoglobin: 18.3 g/dL — ABNORMAL HIGH (ref 13.0–17.0)
Immature Granulocytes: 1 %
Lymphocytes Relative: 11 %
Lymphs Abs: 1.1 10*3/uL (ref 0.7–4.0)
MCH: 31.6 pg (ref 26.0–34.0)
MCHC: 35.5 g/dL (ref 30.0–36.0)
MCV: 89.1 fL (ref 80.0–100.0)
Monocytes Absolute: 1 10*3/uL (ref 0.1–1.0)
Monocytes Relative: 9 %
Neutro Abs: 8.2 10*3/uL — ABNORMAL HIGH (ref 1.7–7.7)
Neutrophils Relative %: 77 %
Platelets: 108 10*3/uL — ABNORMAL LOW (ref 150–400)
RBC: 5.79 MIL/uL (ref 4.22–5.81)
RDW: 13.6 % (ref 11.5–15.5)
WBC: 10.5 10*3/uL (ref 4.0–10.5)
nRBC: 0 % (ref 0.0–0.2)

## 2023-07-25 LAB — LACTIC ACID, PLASMA: Lactic Acid, Venous: 1.6 mmol/L (ref 0.5–1.9)

## 2023-07-25 LAB — COMPREHENSIVE METABOLIC PANEL
ALT: 63 U/L — ABNORMAL HIGH (ref 0–44)
AST: 30 U/L (ref 15–41)
Albumin: 4.5 g/dL (ref 3.5–5.0)
Alkaline Phosphatase: 89 U/L (ref 38–126)
Anion gap: 11 (ref 5–15)
BUN: 13 mg/dL (ref 8–23)
CO2: 26 mmol/L (ref 22–32)
Calcium: 9.6 mg/dL (ref 8.9–10.3)
Chloride: 98 mmol/L (ref 98–111)
Creatinine, Ser: 0.82 mg/dL (ref 0.61–1.24)
GFR, Estimated: >60 mL/min (ref >60)
Glucose, Bld: 181 mg/dL — ABNORMAL HIGH (ref 70–99)
Potassium: 3.7 mmol/L (ref 3.5–5.1)
Sodium: 135 mmol/L (ref 135–145)
Total Bilirubin: 0.9 mg/dL (ref <1.2)
Total Protein: 8.4 g/dL — ABNORMAL HIGH (ref 6.5–8.1)

## 2023-07-25 LAB — PHOSPHORUS: Phosphorus: 3.1 mg/dL (ref 2.5–4.6)

## 2023-07-25 LAB — GLUCOSE, CAPILLARY
Glucose-Capillary: 239 mg/dL — ABNORMAL HIGH (ref 70–99)
Glucose-Capillary: 199 mg/dL — ABNORMAL HIGH (ref 70–99)

## 2023-07-25 LAB — MAGNESIUM: Magnesium: 2.3 mg/dL (ref 1.7–2.4)

## 2023-07-25 MED ORDER — SENNOSIDES-DOCUSATE SODIUM 8.6-50 MG PO TABS: 1.0000 | ORAL_TABLET | Freq: Every evening | ORAL | Status: DC | PRN

## 2023-07-25 MED ORDER — OXYCODONE HCL 5 MG PO TABS
5.0000 mg | ORAL_TABLET | ORAL | Status: DC | PRN
  Filled 2023-07-25: qty 1

## 2023-07-25 MED ORDER — INSULIN ASPART 100 UNIT/ML IJ SOLN
0.0000 [IU] | Freq: Three times a day (TID) | INTRAMUSCULAR | Status: DC
  Administered 2023-07-25: 2 [IU] via SUBCUTANEOUS
  Administered 2023-07-26: 1 [IU] via SUBCUTANEOUS

## 2023-07-25 MED ORDER — SODIUM CHLORIDE 0.9% FLUSH
3.0000 mL | Freq: Two times a day (BID) | INTRAVENOUS | Status: DC
  Administered 2023-07-25: 3 mL via INTRAVENOUS

## 2023-07-25 MED ORDER — MONTELUKAST SODIUM 10 MG PO TABS
10.0000 mg | ORAL_TABLET | Freq: Every day | ORAL | Status: DC
  Administered 2023-07-25: 10 mg via ORAL
  Filled 2023-07-25: qty 1

## 2023-07-25 MED ORDER — LEVALBUTEROL HCL 0.63 MG/3ML IN NEBU: 0.6300 mg | INHALATION_SOLUTION | Freq: Four times a day (QID) | RESPIRATORY_TRACT | Status: DC | PRN

## 2023-07-25 MED ORDER — FLUTICASONE PROPIONATE 50 MCG/ACT NA SUSP: 2.0000 | Freq: Every day | NASAL | Status: DC

## 2023-07-25 MED ORDER — BISACODYL 5 MG PO TBEC
5.0000 mg | DELAYED_RELEASE_TABLET | Freq: Every day | ORAL | Status: DC | PRN
Start: 2023-07-25 — End: 2023-07-26

## 2023-07-25 MED ORDER — IOHEXOL 300 MG/ML  SOLN
75.0000 mL | Freq: Once | INTRAMUSCULAR | Status: AC | PRN
  Administered 2023-07-25: 75 mL via INTRAVENOUS

## 2023-07-25 MED ORDER — HYDROMORPHONE HCL 1 MG/ML IJ SOLN
0.5000 mg | INTRAMUSCULAR | Status: DC | PRN
Start: 2023-07-25 — End: 2023-07-26

## 2023-07-25 MED ORDER — LOSARTAN POTASSIUM 25 MG PO TABS
100.0000 mg | ORAL_TABLET | Freq: Every day | ORAL | Status: DC
  Administered 2023-07-26: 100 mg via ORAL
  Filled 2023-07-25: qty 4

## 2023-07-25 MED ORDER — ACETAMINOPHEN 325 MG PO TABS: 650.0000 mg | ORAL_TABLET | Freq: Four times a day (QID) | ORAL | Status: DC | PRN

## 2023-07-25 MED ORDER — SIMVASTATIN 20 MG PO TABS
40.0000 mg | ORAL_TABLET | Freq: Every day | ORAL | Status: DC
  Administered 2023-07-25: 40 mg via ORAL
  Filled 2023-07-25: qty 2

## 2023-07-25 MED ORDER — HYDRALAZINE HCL 20 MG/ML IJ SOLN: 10.0000 mg | INTRAMUSCULAR | Status: DC | PRN

## 2023-07-25 MED ORDER — FLEET ENEMA RE ENEM: 1.0000 | ENEMA | Freq: Once | RECTAL | Status: DC | PRN

## 2023-07-25 MED ORDER — VANCOMYCIN HCL IN DEXTROSE 1-5 GM/200ML-% IV SOLN: 1000.0000 mg | INTRAVENOUS | Status: DC

## 2023-07-25 MED ORDER — LORATADINE 10 MG PO TABS
10.0000 mg | ORAL_TABLET | Freq: Every day | ORAL | Status: DC
  Administered 2023-07-26: 10 mg via ORAL
  Filled 2023-07-25: qty 1

## 2023-07-25 MED ORDER — VANCOMYCIN HCL IN DEXTROSE 1-5 GM/200ML-% IV SOLN: 1000.0000 mg | Freq: Once | INTRAVENOUS | Status: DC

## 2023-07-25 MED ORDER — VANCOMYCIN HCL IN DEXTROSE 1-5 GM/200ML-% IV SOLN
1000.0000 mg | Freq: Two times a day (BID) | INTRAVENOUS | Status: DC
  Administered 2023-07-26 (×2): 1000 mg via INTRAVENOUS
  Filled 2023-07-25 (×2): qty 200

## 2023-07-25 MED ORDER — MELOXICAM 7.5 MG PO TABS
15.0000 mg | ORAL_TABLET | Freq: Every day | ORAL | Status: DC
  Administered 2023-07-25: 15 mg via ORAL
  Filled 2023-07-25: qty 2

## 2023-07-25 MED ORDER — SERTRALINE HCL 50 MG PO TABS
100.0000 mg | ORAL_TABLET | Freq: Every day | ORAL | Status: DC
  Administered 2023-07-26: 100 mg via ORAL
  Filled 2023-07-25: qty 2

## 2023-07-25 MED ORDER — ONDANSETRON HCL 4 MG PO TABS
4.0000 mg | ORAL_TABLET | Freq: Four times a day (QID) | ORAL | Status: DC | PRN
Start: 2023-07-25 — End: 2023-07-26

## 2023-07-25 MED ORDER — ONDANSETRON HCL 4 MG/2ML IJ SOLN: 4.0000 mg | Freq: Four times a day (QID) | INTRAMUSCULAR | Status: DC | PRN

## 2023-07-25 MED ORDER — IPRATROPIUM BROMIDE 0.02 % IN SOLN: 0.5000 mg | Freq: Four times a day (QID) | RESPIRATORY_TRACT | Status: DC | PRN

## 2023-07-25 MED ORDER — HEPARIN SODIUM (PORCINE) 5000 UNIT/ML IJ SOLN
5000.0000 [IU] | Freq: Three times a day (TID) | INTRAMUSCULAR | Status: DC
  Administered 2023-07-25 – 2023-07-26 (×2): 5000 [IU] via SUBCUTANEOUS
  Filled 2023-07-25 (×2): qty 1

## 2023-07-25 MED ORDER — KETOROLAC TROMETHAMINE 15 MG/ML IJ SOLN
15.0000 mg | Freq: Once | INTRAMUSCULAR | Status: AC
  Administered 2023-07-25: 15 mg via INTRAVENOUS
  Filled 2023-07-25: qty 1

## 2023-07-25 MED ORDER — ACETAMINOPHEN 650 MG RE SUPP: 650.0000 mg | Freq: Four times a day (QID) | RECTAL | Status: DC | PRN

## 2023-07-25 MED ORDER — SODIUM CHLORIDE 0.9 % IV SOLN: INTRAVENOUS | Status: DC

## 2023-07-25 MED ORDER — TRAZODONE HCL 50 MG PO TABS
25.0000 mg | ORAL_TABLET | Freq: Every evening | ORAL | Status: DC | PRN
  Filled 2023-07-25: qty 1

## 2023-07-25 MED ORDER — ASPIRIN 81 MG PO CHEW
81.0000 mg | CHEWABLE_TABLET | Freq: Every day | ORAL | Status: DC
  Administered 2023-07-26: 81 mg via ORAL
  Filled 2023-07-25: qty 1

## 2023-07-25 MED ORDER — VANCOMYCIN HCL IN DEXTROSE 1-5 GM/200ML-% IV SOLN
1000.0000 mg | INTRAVENOUS | Status: AC
  Administered 2023-07-25: 1000 mg via INTRAVENOUS
  Filled 2023-07-25: qty 200

## 2023-07-25 NOTE — Assessment & Plan Note (Signed)
-   Extensive facial cellulitis - Failed outpatient p.o. antibiotics of Bactrim -Admitted for close observation and IV antibiotics -Patient has been initiated on IV vancomycin will continue -Currently stable afebrile and normotensive with no leukocytosis -CT maxillary facial reviewed negative for any abscess

## 2023-07-25 NOTE — Procedures (Signed)
Pharmacy Antibiotic Note  JAHAIR Williams is a 67 y.o. male admitted on 07/25/2023 with cellulitis.  Pharmacy has been consulted for Vancomycin dosing.  Plan: Vancomycin 2000 mg IV loading dose then 1000mg  IV Q 12 hrs. Goal AUC 400-550. Expected AUC: 498 SCr used: 0.82 F/U cxs and clinical progress Monitor V/S, labs, and levels as indicated   Height: 5\' 7"  (170.2 cm) Weight: 112.9 kg (249 lb) IBW/kg (Calculated) : 66.1  Temp (24hrs), Avg:98.5 F (36.9 C), Min:98.1 F (36.7 C), Max:99.2 F (37.3 C)  Recent Labs  Lab 07/25/23 1209  WBC 10.5  CREATININE 0.82  LATICACIDVEN 1.6    Estimated Creatinine Clearance: 103.4 mL/min (by C-G formula based on SCr of 0.82 mg/dL).    Allergies  Allergen Reactions   Lisinopril    Erythromycin Rash    Antimicrobials this admission: vancomycin 12/25 >>   Microbiology results: 12/25 BCx: pending  Thank you for allowing pharmacy to be a part of this patient's care.  Elder Cyphers, BS Pharm D, BCPS Clinical Pharmacist 07/25/2023 2:58 PM

## 2023-07-25 NOTE — Assessment & Plan Note (Signed)
-   Currently stable, holding HCTZ, continue losartan

## 2023-07-25 NOTE — Assessment & Plan Note (Signed)
-   Continue home medication of simvastatin

## 2023-07-25 NOTE — Assessment & Plan Note (Signed)
-   Continue home regimen Flonase, Claritin, Singulair

## 2023-07-25 NOTE — ED Notes (Addendum)
ED TO INPATIENT HANDOFF REPORT  ED Nurse Name and Phone #:  Beverley Fiedler Name/Age/Gender Steve Williams 67 y.o. male Room/Bed: APA11/APA11  Code Status   Code Status: Full Code  Home/SNF/Other Home Patient oriented to: self, place, time, and situation Is this baseline? Yes   Triage Complete: Triage complete  Chief Complaint Cellulitis [L03.90]  Triage Note Pt c/o increasing abscess in R nare and facial swelling x4 days.  Pt was seen x3 days ago for same and started on Bactrim.  Swelling noted in R upper lip.       Allergies Allergies  Allergen Reactions   Lisinopril    Erythromycin Rash    Level of Care/Admitting Diagnosis ED Disposition     ED Disposition  Admit   Condition  --   Comment  Hospital Area: [facility] [[id number]]  Level of Care: Med-Surg [16]  Covid Evaluation: Asymptomatic - no recent exposure (last 10 days) testing not required  Diagnosis: Cellulitis [409811]  Admitting Physician: Kendell Bane [[id number]]  Attending Physician: Kendell Bane [phone]  Certification:: I certify this patient will need inpatient services for at least 2 midnights  Expected Medical Readiness: 07/27/2023          B Medical/Surgery History Past Medical History:  Diagnosis Date   Allergy    Anxiety    Arthritis    Asthma    Cancer (HCC)    Depression    Diabetes mellitus without complication (HCC)    Hyperlipidemia    Hypertension    Past Surgical History:  Procedure Laterality Date   CARPAL BONE EXCISION Bilateral    KNEE ARTHROSCOPY Left    KNEE SURGERY Left 1974   lipoma removed     TRIGGER FINGER RELEASE Right      A IV Location/Drains/Wounds Patient Lines/Drains/Airways Status     Active Line/Drains/Airways     Name Placement date Placement time Site Days   Peripheral IV 07/25/23 20 G 1" Anterior;Right Forearm 07/25/23  1210  Forearm  less than 1            Intake/Output Last 24 hours No intake or output data in  the 24 hours ending 07/25/23 1536  Labs/Imaging Results for orders placed or performed during the hospital encounter of 07/25/23 (from the past 48 hours)  Comprehensive metabolic panel     Status: Abnormal   Collection Time: 07/25/23 12:09 PM  Result Value Ref Range   Sodium 135 135 - 145 mmol/L   Potassium 3.7 3.5 - 5.1 mmol/L   Chloride 98 98 - 111 mmol/L   CO2 26 22 - 32 mmol/L   Glucose, Bld 181 (H) 70 - 99 mg/dL    Comment: Glucose reference range applies only to samples taken after fasting for at least 8 hours.   BUN 13 8 - 23 mg/dL   Creatinine, Ser 0.86 0.61 - 1.24 mg/dL   Calcium 9.6 8.9 - 57.8 mg/dL   Total Protein 8.4 (H) 6.5 - 8.1 g/dL   Albumin 4.5 3.5 - 5.0 g/dL   AST 30 15 - 41 U/L   ALT 63 (H) 0 - 44 U/L   Alkaline Phosphatase 89 38 - 126 U/L   Total Bilirubin 0.9 <1.2 mg/dL   GFR, Estimated >46 >96 mL/min    Comment: (NOTE) Calculated using the CKD-EPI Creatinine Equation (2021)    Anion gap 11 5 - 15    Comment: Performed at [facility], [address] [place], [place] [address]  CBC with Differential     Status: Abnormal   Collection Time: 07/25/23 12:09 PM  Result Value Ref Range   WBC 10.5 4.0 - 10.5 K/uL   RBC 5.79 4.22 - 5.81 MIL/uL   Hemoglobin 18.3 (H) 13.0 - 17.0 g/dL   HCT 56.2 13.0 - 86.5 %   MCV 89.1 80.0 - 100.0 fL   MCH 31.6 26.0 - 34.0 pg   MCHC 35.5 30.0 - 36.0 g/dL   RDW 78.4 69.6 - 29.5 %   Platelets 108 (L) 150 - 400 K/uL   nRBC 0.0 0.0 - 0.2 %   Neutrophils Relative % 77 %   Neutro Abs 8.2 (H) 1.7 - 7.7 K/uL   Lymphocytes Relative 11 %   Lymphs Abs 1.1 0.7 - 4.0 K/uL   Monocytes Relative 9 %   Monocytes Absolute 1.0 0.1 - 1.0 K/uL   Eosinophils Relative 1 %   Eosinophils Absolute 0.1 0.0 - 0.5 K/uL   Basophils Relative 1 %   Basophils Absolute 0.1 0.0 - 0.1 K/uL   Immature Granulocytes 1 %   Abs Immature Granulocytes 0.09 (H) 0.00 - 0.07 K/uL    Comment: Performed at [facility], [address], [place], [place] [address]   Lactic acid, plasma     Status: None   Collection Time: 07/25/23 12:09 PM  Result Value Ref Range   Lactic Acid, Venous 1.6 0.5 - 1.9 mmol/L    Comment: Performed at [facility], [address], [place], [place] [address]  Magnesium     Status: None   Collection Time: 07/25/23  2:44 PM  Result Value Ref Range   Magnesium 2.3 1.7 - 2.4 mg/dL    Comment: Performed at [facility], [address], [place], [place] [address]  Phosphorus     Status: None   Collection Time: 07/25/23  2:44 PM  Result Value Ref Range   Phosphorus 3.1 2.5 - 4.6 mg/dL    Comment: Performed at [facility], [address], [place], [place] [address]   CT Maxillofacial W Contrast Result Date: 07/25/2023 CLINICAL DATA:  Provided history: Facial abscess.  Facial swelling. EXAM: CT MAXILLOFACIAL WITH CONTRAST TECHNIQUE: Multidetector CT imaging of the maxillofacial structures was performed with intravenous contrast. Multiplanar CT image reconstructions were also generated. RADIATION DOSE REDUCTION: This exam was performed according to the departmental dose-optimization program which includes automated exposure control, adjustment of the mA and/or kV according to patient size and/or use of iterative reconstruction technique. CONTRAST:  75mL OMNIPAQUE IOHEXOL 300 MG/ML  SOLN COMPARISON:  Radiographs of the orbits 01/28/2008. FINDINGS: Osseous: No acute fracture or aggressive osseous lesion. Orbits: No acute orbital finding. Sinuses: No significant paranasal sinus disease. Soft tissues: Nasal soft tissue swelling. Surrounding facial and upper neck soft tissue edema/stranding (right greater than left). Additionally, there is soft tissue induration along the ventrolateral aspect of the maxilla on the right, likely reflecting phlegmon. No soft tissue abscess is identified. Limited intracranial: No evidence of an acute intracranial abnormality within the field of view. IMPRESSION: Nasal soft tissue swelling. Surrounding facial and upper  neck soft tissue edema/stranding (right greater than left). These findings are consistent with soft tissue infection/cellulitis. Additionally, there is soft tissue induration along the ventrolateral aspect of the maxilla on the right, likely reflecting phlegmon. No soft tissue abscess is identified. Electronically Signed   By: Jackey Loge D.O.   On: 07/25/2023 13:51    Pending Labs [employer] (From admission, onward)     Start  Ordered   07/26/23 0500  Basic metabolic panel  Daily,   R      07/25/23 1444   07/26/23 0500  CBC  Daily,   R      07/25/23 1444   07/26/23 0500  Protime-INR  Tomorrow morning,   R        07/25/23 1444   07/26/23 0500  APTT  Tomorrow morning,   R        07/25/23 1444   07/25/23 1445  Hemoglobin A1c  Once,   R       Comments: To assess prior glycemic control    07/25/23 1445   07/25/23 1444  HIV Antibody (routine testing w rflx)  (HIV Antibody (Routine testing w reflex) panel)  Once,   R        07/25/23 1444   07/25/23 1444  Protime-INR  Add-on,   AD        07/25/23 1444   07/25/23 1444  Expectorated Sputum Assessment w Gram Stain, Rflx to Resp Cult  Once,   R       Comments: If productive cough    07/25/23 1444   07/25/23 1132  Blood culture (routine x 2)  BLOOD CULTURE X 2,   R (with STAT occurrences)      07/25/23 1132            Vitals/Pain Today's Vitals   07/25/23 1115 07/25/23 1150 07/25/23 1225 07/25/23 1431  BP: 127/68   127/68  Pulse: 89   85  Resp:    18  Temp:   99.2 F (37.3 C) 98.6 F (37 C)  TempSrc:   Oral Oral  SpO2: 94%   95%  Weight:      Height:      PainSc:  0-No pain      Isolation Precautions No active isolations  Medications Medications  vancomycin (VANCOCIN) IVPB 1000 mg/200 mL premix (1,000 mg Intravenous New Bag/Given 07/25/23 1451)  heparin injection 5,000 Units (has no administration in time range)  sodium chloride flush (NS) 0.9 % injection 3 mL (has no administration in time range)  0.9 %   sodium chloride infusion (has no administration in time range)  sodium chloride flush (NS) 0.9 % injection 3 mL (has no administration in time range)  acetaminophen (TYLENOL) tablet 650 mg (has no administration in time range)    Or  acetaminophen (TYLENOL) suppository 650 mg (has no administration in time range)  oxyCODONE (Oxy IR/ROXICODONE) immediate release tablet 5 mg (has no administration in time range)  HYDROmorphone (DILAUDID) injection 0.5-1 mg (has no administration in time range)  traZODone (DESYREL) tablet 25 mg (has no administration in time range)  senna-docusate (Senokot-S) tablet 1 tablet (has no administration in time range)  bisacodyl (DULCOLAX) EC tablet 5 mg (has no administration in time range)  sodium phosphate (FLEET) enema 1 enema (has no administration in time range)  ondansetron (ZOFRAN) tablet 4 mg (has no administration in time range)    Or  ondansetron (ZOFRAN) injection 4 mg (has no administration in time range)  ipratropium (ATROVENT) nebulizer solution 0.5 mg (has no administration in time range)  levalbuterol (XOPENEX) nebulizer solution 0.63 mg (has no administration in time range)  hydrALAZINE (APRESOLINE) injection 10 mg (has no administration in time range)  insulin aspart (novoLOG) injection 0-6 Units (has no administration in time range)  aspirin chewable tablet 81 mg (has no administration in time range)  meloxicam (MOBIC) tablet 15 mg (has no administration in  time range)  losartan (COZAAR) tablet 100 mg (has no administration in time range)  simvastatin (ZOCOR) tablet 40 mg (has no administration in time range)  sertraline (ZOLOFT) tablet 100 mg (has no administration in time range)  montelukast (SINGULAIR) tablet 10 mg (has no administration in time range)  loratadine (CLARITIN) tablet 10 mg (has no administration in time range)  fluticasone (FLONASE) 50 MCG/ACT nasal spray 2 spray (has no administration in time range)  vancomycin (VANCOCIN) IVPB  1000 mg/200 mL premix (has no administration in time range)  ketorolac (TORADOL) 15 MG/ML injection 15 mg (15 mg Intravenous Given 07/25/23 1219)  iohexol (OMNIPAQUE) 300 MG/ML solution 75 mL (75 mLs Intravenous Contrast Given 07/25/23 1323)    Mobility walks       R Recommendations: See Admitting Provider Note  Report given to: [facility]

## 2023-07-25 NOTE — Assessment & Plan Note (Signed)
-   Supplement hormonal therapy testosterone to be continued as an outpatient

## 2023-07-25 NOTE — Progress Notes (Signed)
Patient unable to wear CPAP due face swelling and not being able to get a tight seal on the mask. Patient was placed on 2lpm [place] for sleep and RN was made aware of modification tonight.

## 2023-07-25 NOTE — ED Provider Notes (Signed)
[place] EMERGENCY DEPARTMENT AT [facility] Provider Note   CSN: [id number] Arrival date & time: 07/25/23  0865     History  Chief Complaint  Patient presents with   Abscess   Facial Swelling    Steve Williams is a 67 y.o. male.  He has PMH of diabetes hypertension.  Presents for swelling to the right nares and right cheek area.  This started about 5 days ago as a small pimple in the nose.  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  It got larger over couple of days and he popped it with a Q-tip and got some purulent material out.  He then does some swelling, who presented to the ER early morning 12/23 and was prescribed Bactrim.  He is compliant with this at home but states the swelling is getting much worse.  He also notes he had a temperature last night of 1-2.5 Fahrenheit.  Denies nausea or vomiting, denies weakness.  Denies headache but has soreness to the swollen area, no trouble swallowing or breathing.   Abscess      Home Medications Prior to Admission medications   Medication Sig Start Date End Date Taking? Authorizing Provider  aspirin 81 MG tablet Take 81 mg by mouth daily.    [provider]  B-D 3CC LUER-LOK SYR 22GX1" 22G X 1" 3 ML MISC USE TO GIVE TESTOSTERONE 05/09/23   Dettinger, Elige Radon, MD  BD DISP NEEDLES 18G X 1-1/2" MISC USE TO draw UP testosterone 07/18/22   Marcine Matar, MD  fluticasone (FLONASE) 50 MCG/ACT nasal spray Place into both nostrils daily.    [provider]  hydrochlorothiazide (HYDRODIURIL) 25 MG tablet Take 1 tablet (25 mg total) by mouth daily. 05/09/23   Dettinger, Elige Radon, MD  loratadine (CLARITIN) 10 MG tablet Take 10 mg by mouth daily.    [provider]  losartan (COZAAR) 100 MG tablet Take 1 tablet (100 mg total) by mouth daily. 05/09/23   Dettinger, Elige Radon, MD  meloxicam (MOBIC) 15 MG tablet Take 1 tablet (15 mg total) by mouth daily. 05/09/23    Dettinger, Elige Radon, MD  montelukast (SINGULAIR) 10 MG tablet Take 1 tablet (10 mg total) by mouth at bedtime. 05/09/23   Dettinger, Elige Radon, MD  promethazine-dextromethorphan (PROMETHAZINE-DM) 6.25-15 MG/5ML syrup Take 5 mLs by mouth 3 (three) times daily as needed for cough. Patient not taking: Reported on 06/05/2023 03/29/23   Jannifer Rodney A, FNP  sertraline (ZOLOFT) 100 MG tablet Take 1 tablet (100 mg total) by mouth daily. 05/09/23   Dettinger, Elige Radon, MD  sildenafil (REVATIO) 20 MG tablet take 1 TO 5 TABLETS BY MOUTH EVERY DAY AS NEEDED 05/28/23   Dettinger, Elige Radon, MD  sildenafil (VIAGRA) 100 MG tablet Take 1 tablet (100 mg total) by mouth daily as needed for erectile dysfunction. 06/06/22   Marcine Matar, MD  simvastatin (ZOCOR) 40 MG tablet Take 1 tablet (40 mg total) by mouth daily. 05/09/23   Dettinger, Elige Radon, MD  sulfamethoxazole-trimethoprim (BACTRIM DS) 800-160 MG tablet Take 1 tablet by mouth 2 (two) times daily. 07/23/23   Gilda Crease, MD  Syringe/Needle, Disp, 18G X 1" 3 ML MISC Use as directed Use to draw up testosterone 05/09/23   Dettinger, Elige Radon, MD  tadalafil (CIALIS) 5 MG tablet Take 1 tablet (5 mg total) by mouth daily as needed for erectile dysfunction. 06/05/23   Marcine Matar, MD  testosterone cypionate (  DEPOTESTOTERONE CYPIONATE) 100 MG/ML injection INJECT 1 ML INTRAMUSCULARLY EVERY 7 DAYS 06/05/23   Marcine Matar, MD      Allergies    Lisinopril and Erythromycin    Review of Systems   Review of Systems  Physical Exam Updated Vital Signs BP 127/68 (BP Location: Left Arm)   Pulse 85   Temp 98.6 F (37 C) (Oral)   Resp 18   Ht 5\' 7"  (1.702 m)   Wt 112.9 kg   SpO2 95%   BMI 39.00 kg/m  Physical Exam Vitals and nursing note reviewed.  Constitutional:      General: He is not in acute distress.    Appearance: He is well-developed.  HENT:     Head: Atraumatic.      Nose:      Mouth/Throat:     Mouth: Mucous membranes are  moist.     Dentition: Abnormal dentition.  Eyes:     Conjunctiva/sclera: Conjunctivae normal.  Cardiovascular:     Rate and Rhythm: Normal rate and regular rhythm.     Heart sounds: No murmur heard. Pulmonary:     Effort: Pulmonary effort is normal. No respiratory distress.     Breath sounds: Normal breath sounds.  Abdominal:     Palpations: Abdomen is soft.     Tenderness: There is no abdominal tenderness.  Musculoskeletal:        General: No swelling.     Cervical back: Neck supple.  Skin:    General: Skin is warm and dry.     Capillary Refill: Capillary refill takes less than 2 seconds.  Neurological:     General: No focal deficit present.     Mental Status: He is alert and oriented to person, place, and time.  Psychiatric:        Mood and Affect: Mood normal.     ED Results / Procedures / Treatments   Labs (all labs ordered are listed, but only abnormal results are displayed) Labs Reviewed  COMPREHENSIVE METABOLIC PANEL - Abnormal; Notable for the following components:      Result Value   Glucose, Bld 181 (*)    Total Protein 8.4 (*)    ALT 63 (*)    All other components within normal limits  CBC WITH DIFFERENTIAL/PLATELET - Abnormal; Notable for the following components:   Hemoglobin 18.3 (*)    Platelets 108 (*)    Neutro Abs 8.2 (*)    Abs Immature Granulocytes 0.09 (*)    All other components within normal limits  CULTURE, BLOOD (ROUTINE X 2)  CULTURE, BLOOD (ROUTINE X 2)  EXPECTORATED SPUTUM ASSESSMENT W GRAM STAIN, RFLX TO RESP C  LACTIC ACID, PLASMA  HIV ANTIBODY (ROUTINE TESTING W REFLEX)  MAGNESIUM  PHOSPHORUS  PROTIME-INR  HEMOGLOBIN A1C    EKG None  Radiology CT Maxillofacial W Contrast Result Date: 07/25/2023 CLINICAL DATA:  Provided history: Facial abscess.  Facial swelling. EXAM: CT MAXILLOFACIAL WITH CONTRAST TECHNIQUE: Multidetector CT imaging of the maxillofacial structures was performed with intravenous contrast. Multiplanar CT image  reconstructions were also generated. RADIATION DOSE REDUCTION: This exam was performed according to the departmental dose-optimization program which includes automated exposure control, adjustment of the mA and/or kV according to patient size and/or use of iterative reconstruction technique. CONTRAST:  75mL OMNIPAQUE IOHEXOL 300 MG/ML  SOLN COMPARISON:  Radiographs of the orbits 01/28/2008. FINDINGS: Osseous: No acute fracture or aggressive osseous lesion. Orbits: No acute orbital finding. Sinuses: No significant paranasal sinus disease. Soft tissues: Nasal soft  tissue swelling. Surrounding facial and upper neck soft tissue edema/stranding (right greater than left). Additionally, there is soft tissue induration along the ventrolateral aspect of the maxilla on the right, likely reflecting phlegmon. No soft tissue abscess is identified. Limited intracranial: No evidence of an acute intracranial abnormality within the field of view. IMPRESSION: Nasal soft tissue swelling. Surrounding facial and upper neck soft tissue edema/stranding (right greater than left). These findings are consistent with soft tissue infection/cellulitis. Additionally, there is soft tissue induration along the ventrolateral aspect of the maxilla on the right, likely reflecting phlegmon. No soft tissue abscess is identified. Electronically Signed   By: Jackey Loge D.O.   On: 07/25/2023 13:51    Procedures Procedures    Medications Ordered in ED Medications  vancomycin (VANCOCIN) IVPB 1000 mg/200 mL premix (1,000 mg Intravenous New Bag/Given 07/25/23 1451)  heparin injection 5,000 Units (has no administration in time range)  sodium chloride flush (NS) 0.9 % injection 3 mL (has no administration in time range)  0.9 %  sodium chloride infusion (has no administration in time range)  sodium chloride flush (NS) 0.9 % injection 3 mL (has no administration in time range)  acetaminophen (TYLENOL) tablet 650 mg (has no administration in time  range)    Or  acetaminophen (TYLENOL) suppository 650 mg (has no administration in time range)  oxyCODONE (Oxy IR/ROXICODONE) immediate release tablet 5 mg (has no administration in time range)  HYDROmorphone (DILAUDID) injection 0.5-1 mg (has no administration in time range)  traZODone (DESYREL) tablet 25 mg (has no administration in time range)  senna-docusate (Senokot-S) tablet 1 tablet (has no administration in time range)  bisacodyl (DULCOLAX) EC tablet 5 mg (has no administration in time range)  sodium phosphate (FLEET) enema 1 enema (has no administration in time range)  ondansetron (ZOFRAN) tablet 4 mg (has no administration in time range)    Or  ondansetron (ZOFRAN) injection 4 mg (has no administration in time range)  ipratropium (ATROVENT) nebulizer solution 0.5 mg (has no administration in time range)  levalbuterol (XOPENEX) nebulizer solution 0.63 mg (has no administration in time range)  hydrALAZINE (APRESOLINE) injection 10 mg (has no administration in time range)  insulin aspart (novoLOG) injection 0-6 Units (has no administration in time range)  vancomycin (VANCOCIN) IVPB 1000 mg/200 mL premix (has no administration in time range)  aspirin tablet 81 mg (has no administration in time range)  meloxicam (MOBIC) tablet 15 mg (has no administration in time range)  losartan (COZAAR) tablet 100 mg (has no administration in time range)  simvastatin (ZOCOR) tablet 40 mg (has no administration in time range)  sertraline (ZOLOFT) tablet 100 mg (has no administration in time range)  montelukast (SINGULAIR) tablet 10 mg (has no administration in time range)  loratadine (CLARITIN) tablet 10 mg (has no administration in time range)  fluticasone (FLONASE) 50 MCG/ACT nasal spray 2 spray (has no administration in time range)  ketorolac (TORADOL) 15 MG/ML injection 15 mg (15 mg Intravenous Given 07/25/23 1219)  iohexol (OMNIPAQUE) 300 MG/ML solution 75 mL (75 mLs Intravenous Contrast Given  07/25/23 1323)    ED Course/ Medical Decision Making/ A&P                                 Medical Decision Making This patient presents to the ED for concern of sided facial swelling that is worsening, this involves an extensive number of treatment options, and is a complaint that  carries with it a high risk of complications and morbidity.  The differential diagnosis includes abscess, cellulitis, dental infection, angioedema, other   Co morbidities that complicate the patient evaluation :   Diabetes   Additional history obtained:  Additional history obtained from EMR External records from outside source obtained and reviewed including prior notes   Lab Tests:  I Ordered, and personally interpreted labs.  The pertinent results include: CBC shows no leukocytosis, no anemia, CMP shows mild hyperglycemia, no significant electrolyte derangements, lactic acid normal, blood cultures in process.   Imaging Studies ordered:  I ordered imaging studies including CT face which shows facial cellulitis with no abscess, likely phlegmon development along right ventrolateral maxillary area I independently visualized and interpreted imaging within scope of identifying emergent findings  I agree with the radiologist interpretation     Consultations Obtained:  I requested consultation with the [facility] hospitalist, Dr. Jola Schmidt,  and discussed lab and imaging findings as well as pertinent plan - they recommend: Admission for IV antibiotics   Problem List / ED Course / Critical interventions / Medication management  Right-sided facial cellulitis that is worsening despite several days of outpatient Bactrim for what started as a pustule inside his right nostril with localized swelling and has gotten worse.  He had a fever of 102.5 last night at home.  Afebrile today, not meeting sepsis criteria, CT ordered to rule out any deep space infection but does show right sided cellulitis with possible phlegmon  but no abscess.  Will admit for IV antibiotics as above due to outpatient failure.  Patient did question going home with different antibiotics but we discussed that given the location, his history of diabetes, and the fever he had last night he would be high risk for worsening and admission was recommended.  He is agreeable with this. I ordered medication including toradol  for pain  Reevaluation of the patient after these medicines showed that the patient improved I have reviewed the patients home medicines and have made adjustments as needed       Amount and/or Complexity of Data Reviewed Labs: ordered. Radiology: ordered.  Risk Prescription drug management. Decision regarding hospitalization.           Final Clinical Impression(s) / ED Diagnoses Final diagnoses:  None    Rx / DC Orders ED Discharge Orders     None         Ma Rings, PA-C 07/25/23 1456    Bethann Berkshire, MD 07/27/23 [phone]

## 2023-07-25 NOTE — Hospital Course (Addendum)
Steve Williams is a 67 year old male with extensive history of allergic rhinitis, hypertension, HLD, depression, DM2..  Presenting with progressive facial swelling and erythema.  Symptoms started 4 days ago, patient was started on Bactrim 3 days ago, per patient no results progressively getting worse  Patient reports symptoms started with simple pimple in the right nare, he was able to get the pus out and drained, but noticed increased swelling and redness over right upper lip and expanding since last night.   ED evaluation/course: Blood pressure 127/68, pulse 85, temperature 98.6 F (37 C), RR 18, height 5\' 7"  (1.702 m), weight 112.9 kg, SpO2 95%. Tmax 99.2, WBC 10.5,Platelets 108, Glucose 181, Lactic acid 1.6  In ED started on IV vancomycin.   Due to failed outpatient p.o. antibiotic treatment requested patient to be admitted for IV antibiotics.   CT facial/maxillary IMPRESSION: Nasal soft tissue swelling. Surrounding facial and upper neck soft tissue edema/stranding (right greater than left). These findings are consistent with soft tissue infection/cellulitis. Additionally, there is soft tissue induration along the ventrolateral aspect of the maxilla on the right, likely reflecting phlegmon. No soft tissue abscess is identified.

## 2023-07-25 NOTE — Assessment & Plan Note (Addendum)
-   Will follow-up with med reconciliation to evaluate home regimen -At this point we will check CBG q. [facility] S with SSI coverage -A1c 6.5 on October 2024

## 2023-07-25 NOTE — H&P (Signed)
History and Physical   Patient: Steve Williams                            PCP: Dettinger, Elige Radon, MD                    DOB: Jun 26, 1956            DOA: 07/25/2023 ION:[id number]             DOS: 07/25/2023, 3:11 PM  Dettinger, Elige Radon, MD  Patient coming from:   HOME  I have personally reviewed patient's medical records, in electronic medical records, including:  [place] [facility], and care everywhere.    Chief Complaint:   Chief Complaint  Patient presents with   Abscess   Facial Swelling    History of present illness:    Steve Williams is a 67 year old male with extensive history of allergic rhinitis, hypertension, HLD, depression, DM2..  Presenting with progressive facial swelling and erythema.  Symptoms started 4 days ago, patient was started on Bactrim 3 days ago, per patient no results progressively getting worse  Patient reports symptoms started with simple pimple in the right nare, he was able to get the pus out and drained, but noticed increased swelling and redness over right upper lip and expanding since last night.   ED evaluation/course: Blood pressure 127/68, pulse 85, temperature 98.6 F (37 C), RR 18, height 5\' 7"  (1.702 m), weight 112.9 kg, SpO2 95%. Tmax 99.2, WBC 10.5,Platelets 108, Glucose 181, Lactic acid 1.6  In ED started on IV vancomycin.   Due to failed outpatient p.o. antibiotic treatment requested patient to be admitted for IV antibiotics.   CT facial/maxillary IMPRESSION: Nasal soft tissue swelling. Surrounding facial and upper neck soft tissue edema/stranding (right greater than left). These findings are consistent with soft tissue infection/cellulitis. Additionally, there is soft tissue induration along the ventrolateral aspect of the maxilla on the right, likely reflecting phlegmon. No soft tissue abscess is identified.     Patient Denies having:  Cough, SOB, Chest Pain, Abd pain, N/V/D, headache, dizziness, lightheadedness,  Dysuria,  Joint pain, rash, open wounds    Review of Systems: As per HPI, otherwise 10 point review of systems were negative.   ----------------------------------------------------------------------------------------------------------------------  Allergies  Allergen Reactions   Lisinopril    Erythromycin Rash    Home MEDs:  Prior to Admission medications   Medication Sig Start Date End Date Taking? Authorizing Provider  aspirin 81 MG tablet Take 81 mg by mouth daily.    [provider]  B-D 3CC LUER-LOK SYR 22GX1" 22G X 1" 3 ML MISC USE TO GIVE TESTOSTERONE 05/09/23   Dettinger, Elige Radon, MD  BD DISP NEEDLES 18G X 1-1/2" MISC USE TO draw UP testosterone 07/18/22   Marcine Matar, MD  fluticasone (FLONASE) 50 MCG/ACT nasal spray Place into both nostrils daily.    [provider]  hydrochlorothiazide (HYDRODIURIL) 25 MG tablet Take 1 tablet (25 mg total) by mouth daily. 05/09/23   Dettinger, Elige Radon, MD  loratadine (CLARITIN) 10 MG tablet Take 10 mg by mouth daily.    [provider]  losartan (COZAAR) 100 MG tablet Take 1 tablet (100 mg total) by mouth daily. 05/09/23   Dettinger, Elige Radon, MD  meloxicam (MOBIC) 15 MG tablet Take 1 tablet (15 mg total) by mouth daily. 05/09/23   Dettinger, Elige Radon, MD  montelukast (SINGULAIR) 10 MG tablet Take 1  tablet (10 mg total) by mouth at bedtime. 05/09/23   Dettinger, Elige Radon, MD  promethazine-dextromethorphan (PROMETHAZINE-DM) 6.25-15 MG/5ML syrup Take 5 mLs by mouth 3 (three) times daily as needed for cough. Patient not taking: Reported on 06/05/2023 03/29/23   Jannifer Rodney A, FNP  sertraline (ZOLOFT) 100 MG tablet Take 1 tablet (100 mg total) by mouth daily. 05/09/23   Dettinger, Elige Radon, MD  sildenafil (REVATIO) 20 MG tablet take 1 TO 5 TABLETS BY MOUTH EVERY DAY AS NEEDED 05/28/23   Dettinger, Elige Radon, MD  sildenafil (VIAGRA) 100 MG tablet Take 1 tablet (100 mg total) by mouth daily as needed for erectile dysfunction.  06/06/22   Marcine Matar, MD  simvastatin (ZOCOR) 40 MG tablet Take 1 tablet (40 mg total) by mouth daily. 05/09/23   Dettinger, Elige Radon, MD  sulfamethoxazole-trimethoprim (BACTRIM DS) 800-160 MG tablet Take 1 tablet by mouth 2 (two) times daily. 07/23/23   Gilda Crease, MD  Syringe/Needle, Disp, 18G X 1" 3 ML MISC Use as directed Use to draw up testosterone 05/09/23   Dettinger, Elige Radon, MD  tadalafil (CIALIS) 5 MG tablet Take 1 tablet (5 mg total) by mouth daily as needed for erectile dysfunction. 06/05/23   Marcine Matar, MD  testosterone cypionate (DEPOTESTOTERONE CYPIONATE) 100 MG/ML injection INJECT 1 ML INTRAMUSCULARLY EVERY 7 DAYS 06/05/23   Marcine Matar, MD    PRN MEDs: acetaminophen **OR** acetaminophen, bisacodyl, hydrALAZINE, HYDROmorphone (DILAUDID) injection, ipratropium, levalbuterol, ondansetron **OR** ondansetron (ZOFRAN) IV, oxyCODONE, senna-docusate, sodium phosphate, traZODone  Past Medical History:  Diagnosis Date   Allergy    Anxiety    Arthritis    Asthma    Cancer (HCC)    Depression    Diabetes mellitus without complication (HCC)    Hyperlipidemia    Hypertension     Past Surgical History:  Procedure Laterality Date   CARPAL BONE EXCISION Bilateral    KNEE ARTHROSCOPY Left    KNEE SURGERY Left 1974   lipoma removed     TRIGGER FINGER RELEASE Right      reports that he has never smoked. He has never used smokeless tobacco. He reports that he does not drink alcohol and does not use drugs.   Family History  Problem Relation Age of Onset   Heart disease Mother    Hypertension Mother    Diabetes Mother    Heart disease Father    Cancer Father        liver   Diabetes Father    Hypertension Sister    Diabetes Sister    Migraines Son     Physical Exam:   Vitals:   07/25/23 1100 07/25/23 1115 07/25/23 1225 07/25/23 1431  BP: 129/83 127/68  127/68  Pulse: 89 89  85  Resp:    18  Temp:   99.2 F (37.3 C) 98.6 F (37 C)   TempSrc:   Oral Oral  SpO2: 94% 94%  95%  Weight:      Height:       Constitutional: NAD, calm, comfortable Eyes: PERRL, lids and conjunctivae normal ENMT: Right facial erythema edema-patent airways, no trouble swallowing Mucous membranes are moist. Posterior pharynx clear of any exudate or lesions.Normal dentition.  Neck: normal, supple, no masses, no thyromegaly Respiratory: clear to auscultation bilaterally, no wheezing, no crackles. Normal respiratory effort. No accessory muscle use.  Cardiovascular: Regular rate and rhythm, no murmurs / rubs / gallops. No extremity edema. 2+ pedal pulses. No carotid bruits.  Abdomen: no tenderness,  no masses palpated. No hepatosplenomegaly. Bowel sounds positive.  Musculoskeletal: no clubbing / cyanosis. No joint deformity upper and lower extremities. Good ROM, no contractures. Normal muscle tone.  Neurologic: CN II-XII grossly intact. Sensation intact, DTR normal. Strength 5/5 in all 4.  Psychiatric: Normal judgment and insight. Alert and oriented x 3. Normal mood.  Skin: no rashes, lesions, ulcers. No induration Right upper lip facial erythema edema      Labs on admission:    I have personally reviewed following labs and imaging studies  CBC: Recent Labs  Lab 07/25/23 1209  WBC 10.5  NEUTROABS 8.2*  HGB 18.3*  HCT 51.6  MCV 89.1  PLT 108*   Basic Metabolic Panel: Recent Labs  Lab 07/25/23 1209 07/25/23 1444  NA 135  --   K 3.7  --   CL 98  --   CO2 26  --   GLUCOSE 181*  --   BUN 13  --   CREATININE 0.82  --   CALCIUM 9.6  --   MG  --  2.3  PHOS  --  3.1   GFR: Estimated Creatinine Clearance: 103.4 mL/min (by C-G formula based on SCr of 0.82 mg/dL). Liver Function Tests: Recent Labs  Lab 07/25/23 1209  AST 30  ALT 63*  ALKPHOS 89  BILITOT 0.9  PROT 8.4*  ALBUMIN 4.5    Urine analysis:    Component Value Date/Time   APPEARANCEUR Clear 06/05/2023 1323   GLUCOSEU 1+ (A) 06/05/2023 1323   BILIRUBINUR  Negative 06/05/2023 1323   PROTEINUR Negative 06/05/2023 1323   NITRITE Negative 06/05/2023 1323   LEUKOCYTESUR Negative 06/05/2023 1323    Last A1C:  Lab Results  Component Value Date   HGBA1C 6.5 (H) 05/09/2023     Radiologic Exams on Admission:   CT Maxillofacial W Contrast Result Date: 07/25/2023 CLINICAL DATA:  Provided history: Facial abscess.  Facial swelling. EXAM: CT MAXILLOFACIAL WITH CONTRAST TECHNIQUE: Multidetector CT imaging of the maxillofacial structures was performed with intravenous contrast. Multiplanar CT image reconstructions were also generated. RADIATION DOSE REDUCTION: This exam was performed according to the departmental dose-optimization program which includes automated exposure control, adjustment of the mA and/or kV according to patient size and/or use of iterative reconstruction technique. CONTRAST:  75mL OMNIPAQUE IOHEXOL 300 MG/ML  SOLN COMPARISON:  Radiographs of the orbits 01/28/2008. FINDINGS: Osseous: No acute fracture or aggressive osseous lesion. Orbits: No acute orbital finding. Sinuses: No significant paranasal sinus disease. Soft tissues: Nasal soft tissue swelling. Surrounding facial and upper neck soft tissue edema/stranding (right greater than left). Additionally, there is soft tissue induration along the ventrolateral aspect of the maxilla on the right, likely reflecting phlegmon. No soft tissue abscess is identified. Limited intracranial: No evidence of an acute intracranial abnormality within the field of view. IMPRESSION: Nasal soft tissue swelling. Surrounding facial and upper neck soft tissue edema/stranding (right greater than left). These findings are consistent with soft tissue infection/cellulitis. Additionally, there is soft tissue induration along the ventrolateral aspect of the maxilla on the right, likely reflecting phlegmon. No soft tissue abscess is identified. Electronically Signed   By: Jackey Loge D.O.   On: 07/25/2023 13:51    EKG:    Independently reviewed.  Orders placed or performed during the hospital encounter of 07/25/23   EKG 12-Lead   ---------------------------------------------------------------------------------------------------------------------------------------    Assessment / Plan:   Principal Problem:   Facial cellulitis Active Problems:   Diabetes mellitus (HCC)   Hypertension associated with diabetes (HCC)  Allergic rhinitis   Hypogonadism in male   Hyperlipidemia   Assessment and Plan: * Facial cellulitis - Extensive facial cellulitis - Failed outpatient p.o. antibiotics of Bactrim -Admitted for close observation and IV antibiotics -Patient has been initiated on IV vancomycin will continue -Currently stable afebrile and normotensive with no leukocytosis -CT maxillary facial reviewed negative for any abscess   Diabetes mellitus (HCC) - Will follow-up with med reconciliation to evaluate home regimen -At this point we will check CBG q. [facility] with SSI coverage -A1c 6.5 on October 2024  Allergic rhinitis - Continue home regimen Flonase, Claritin, Singulair  Hypertension associated with diabetes (HCC) - Currently stable, holding HCTZ, continue losartan  Hyperlipidemia - Continue home medication of simvastatin  Hypogonadism in male - Supplement hormonal therapy testosterone to be continued as an outpatient     Consults called: None -------------------------------------------------------------------------------------------------------------------------------------------- DVT prophylaxis:  heparin injection 5,000 Units Start: 07/25/23 2200 TED hose Start: 07/25/23 1444 SCDs Start: 07/25/23 1444   Code Status:   Code Status: Full Code   Admission status: Patient will be admitted as Inpatient, with a greater than 2 midnight length of stay. Level of care: Med-Surg   Family Communication:  none at bedside  (The above findings and plan of care has been discussed with  patient in detail, the patient expressed understanding and agreement of above plan)  --------------------------------------------------------------------------------------------------------------------------------------------------  Disposition Plan:  Anticipated 1-2 days Status is: Inpatient Remains inpatient appropriate because: Antibiotics     ----------------------------------------------------------------------------------------------------------------------------------------------------  Time spent:  62  Min.  Was spent seeing and evaluating the patient, reviewing all medical records, drawn plan of care.  SIGNED: Kendell Bane, MD, FHM. FAAFP. [place] [facility], Pager  (Please use [email or web address] to page/ or secure chat through epic) If 7PM-7AM, please contact night-coverage [email or web address],  07/25/2023, 3:11 PM

## 2023-07-25 NOTE — ED Triage Notes (Signed)
Pt c/o increasing abscess in R nare and facial swelling x4 days.  Pt was seen x3 days ago for same and started on Bactrim.  Swelling noted in R upper lip.

## 2023-07-26 DIAGNOSIS — L03211 Cellulitis of face: Secondary | ICD-10-CM | POA: Diagnosis not present

## 2023-07-26 LAB — CBC
HCT: 46 % (ref 39.0–52.0)
Hemoglobin: 15.9 g/dL (ref 13.0–17.0)
MCH: 30.8 pg (ref 26.0–34.0)
MCHC: 34.6 g/dL (ref 30.0–36.0)
MCV: 89 fL (ref 80.0–100.0)
Platelets: 103 10*3/uL — ABNORMAL LOW (ref 150–400)
RBC: 5.17 MIL/uL (ref 4.22–5.81)
RDW: 13.2 % (ref 11.5–15.5)
WBC: 9.3 10*3/uL (ref 4.0–10.5)
nRBC: 0 % (ref 0.0–0.2)

## 2023-07-26 LAB — BASIC METABOLIC PANEL
Anion gap: 10 (ref 5–15)
BUN: 14 mg/dL (ref 8–23)
CO2: 25 mmol/L (ref 22–32)
Calcium: 8.5 mg/dL — ABNORMAL LOW (ref 8.9–10.3)
Chloride: 99 mmol/L (ref 98–111)
Creatinine, Ser: 0.78 mg/dL (ref 0.61–1.24)
GFR, Estimated: >60 mL/min (ref >60)
Glucose, Bld: 175 mg/dL — ABNORMAL HIGH (ref 70–99)
Potassium: 3.6 mmol/L (ref 3.5–5.1)
Sodium: 134 mmol/L — ABNORMAL LOW (ref 135–145)

## 2023-07-26 LAB — APTT: aPTT: 30 s (ref 24–36)

## 2023-07-26 LAB — GLUCOSE, CAPILLARY: Glucose-Capillary: 157 mg/dL — ABNORMAL HIGH (ref 70–99)

## 2023-07-26 LAB — PROTIME-INR
INR: 1.1 (ref 0.8–1.2)
Prothrombin Time: 14 s (ref 11.4–15.2)

## 2023-07-26 LAB — HIV ANTIBODY (ROUTINE TESTING W REFLEX): HIV Screen 4th Generation wRfx: NONREACTIVE

## 2023-07-26 MED ORDER — LACTINEX PO CHEW: 1.0000 | CHEWABLE_TABLET | Freq: Three times a day (TID) | ORAL | 0 refills | Status: DC

## 2023-07-26 MED ORDER — CLINDAMYCIN HCL 300 MG PO CAPS: 300.0000 mg | ORAL_CAPSULE | Freq: Three times a day (TID) | ORAL | 0 refills | Status: AC

## 2023-07-26 NOTE — Progress Notes (Signed)
Mobility Specialist Progress Note:    07/26/23 1315  Mobility  Activity Ambulated with assistance in hallway  Level of Assistance Independent  Assistive Device None  Distance Ambulated (ft) 250 ft  Range of Motion/Exercises Active;All extremities  Activity Response Tolerated well  Mobility Referral Yes  Mobility visit 1 Mobility  Mobility Specialist Start Time (ACUTE ONLY) 1250  Mobility Specialist Stop Time (ACUTE ONLY) 1310  Mobility Specialist Time Calculation (min) (ACUTE ONLY) 20 min   Pt received in bed, agreeable to mobility. Independently able to stand and ambulate with no AD. Tolerated well, asx throughout. Returned to room, left pt sitting EOB. All needs met.   Lawerance Bach Mobility Specialist Please contact via Special educational needs teacher or  Rehab office at [phone]

## 2023-07-26 NOTE — Plan of Care (Signed)

## 2023-07-26 NOTE — Discharge Summary (Signed)
Physician Discharge Summary   Patient: Steve Williams MRN: [id number] DOB: Sep 22, 1955  Admit date:     07/25/2023  Discharge date: 07/26/23  Discharge Physician: Kendell Bane   PCP: Dettinger, Elige Radon, MD   Recommendations at discharge:  -Follow-up with PCP in 1 week -Continue current recommended antibiotics -Continue monitoring blood sugars closely, carb modified diabetic diet recommended  Discharge Diagnoses: Principal Problem:   Facial cellulitis Active Problems:   Diabetes mellitus (HCC)   Hypertension associated with diabetes (HCC)   Allergic rhinitis   Hypogonadism in male   Hyperlipidemia  Resolved Problems:   Cellulitis  Hospital Course: Steve Williams is a 67 year old male with extensive history of allergic rhinitis, hypertension, HLD, depression, DM2..  Presenting with progressive facial swelling and erythema.  Symptoms started 4 days ago, patient was started on Bactrim 3 days ago, per patient no results progressively getting worse  Patient reports symptoms started with simple pimple in the right nare, he was able to get the pus out and drained, but noticed increased swelling and redness over right upper lip and expanding since last night.   ED evaluation/course: Blood pressure 127/68, pulse 85, temperature 98.6 F (37 C), RR 18, height 5\' 7"  (1.702 m), weight 112.9 kg, SpO2 95%. Tmax 99.2, WBC 10.5,Platelets 108, Glucose 181, Lactic acid 1.6  In ED started on IV vancomycin.   Due to failed outpatient p.o. antibiotic treatment requested patient to be admitted for IV antibiotics.   CT facial/maxillary IMPRESSION: Nasal soft tissue swelling. Surrounding facial and upper neck soft tissue edema/stranding (right greater than left). These findings are consistent with soft tissue infection/cellulitis. Additionally, there is soft tissue induration along the ventrolateral aspect of the maxilla on the right, likely reflecting phlegmon. No soft tissue abscess is  identified.   * Facial cellulitis - Extensive facial cellulitis - Failed outpatient p.o. antibiotics of Bactrim -Admitted for close observation and IV antibiotics -Responded well to IV vancomycin-cultures negative,-will switch to p.o. clindamycin  -Currently stable afebrile and normotensive with no leukocytosis -CT maxillary facial reviewed negative for any abscess   Diabetes mellitus (HCC) - Will follow-up with med reconciliation to evaluate home regimen -Per patient well-controlled on diabetic diet -A1c 6.5 on October 2024  Allergic rhinitis - Continue home regimen Flonase, Claritin, Singulair  Hypertension associated with diabetes (HCC) - Currently stable, holding HCTZ, continue losartan  Hyperlipidemia - Continue home medication of simvastatin  Hypogonadism in male - Supplement hormonal therapy testosterone to be continued as an outpatient      Disposition: Home Diet recommendation:  Discharge Diet Orders (From admission, onward)     Start     Ordered   07/26/23 0000  Diet - low sodium heart healthy        07/26/23 1106           Carb modified diet DISCHARGE MEDICATION: Allergies as of 07/26/2023       Reactions   Lisinopril    Erythromycin Rash        Medication List     STOP taking these medications    sulfamethoxazole-trimethoprim 800-160 MG tablet Commonly known as: BACTRIM DS       TAKE these medications    acetaminophen 500 MG tablet Commonly known as: TYLENOL Take 1,000 mg by mouth every 6 (six) hours as needed for mild pain (pain score 1-3).   aspirin 81 MG tablet Take 81 mg by mouth daily.   B-D 3CC LUER-LOK SYR 22GX1" 22G X 1" 3 ML Misc Generic  drug: SYRINGE-NEEDLE (DISP) 3 ML USE TO GIVE TESTOSTERONE   Syringe/Needle (Disp) 18G X 1" 3 ML Misc Use as directed Use to draw up testosterone   BD Disp Needles 18G X 1-1/2" Misc Generic drug: NEEDLE (DISP) 18 G USE TO draw UP testosterone   clindamycin 300 MG  capsule Commonly known as: CLEOCIN Take 1 capsule (300 mg total) by mouth 3 (three) times daily for 7 days.   fluticasone 50 MCG/ACT nasal spray Commonly known as: FLONASE Place 1 spray into both nostrils daily.   hydrochlorothiazide 25 MG tablet Commonly known as: HYDRODIURIL Take 1 tablet (25 mg total) by mouth daily.   lactobacillus acidophilus & bulgar chewable tablet Chew 1 tablet by mouth 3 (three) times daily with meals for 10 days.   loratadine 10 MG tablet Commonly known as: CLARITIN Take 10 mg by mouth daily.   losartan 100 MG tablet Commonly known as: COZAAR Take 1 tablet (100 mg total) by mouth daily.   meloxicam 15 MG tablet Commonly known as: MOBIC Take 1 tablet (15 mg total) by mouth daily.   montelukast 10 MG tablet Commonly known as: SINGULAIR Take 1 tablet (10 mg total) by mouth at bedtime.   sertraline 100 MG tablet Commonly known as: ZOLOFT Take 1 tablet (100 mg total) by mouth daily.   simvastatin 40 MG tablet Commonly known as: ZOCOR Take 1 tablet (40 mg total) by mouth daily.   testosterone cypionate 100 MG/ML injection Commonly known as: DEPOTESTOTERONE CYPIONATE INJECT 1 ML INTRAMUSCULARLY EVERY 7 DAYS        Discharge Exam: Filed Weights   07/25/23 1031 07/26/23 0500  Weight: 112.9 kg 109.8 kg        General:  AAO x 3,  cooperative, no distress;   HEENT:  Improved right facial erythema edema  Speech intact, no difficulty with swallowing, airway patent normocephalic, PERRL, otherwise with in Normal limits   Neuro:  CNII-XII intact. , normal motor and sensation, reflexes intact   Lungs:   Clear to auscultation BL, Respirations unlabored,  No wheezes / crackles  Cardio:    S1/S2, RRR, No murmure, No Rubs or Gallops   Abdomen:  Soft, non-tender, bowel sounds active all four quadrants, no guarding or peritoneal signs.  Muscular  skeletal:  Limited exam -global generalized weaknesses - in bed, able to move all 4 extremities,    2+ pulses,  symmetric, No pitting edema  Skin:  Dry, warm to touch, negative for any Rashes,  Wounds: Please see nursing documentation          Condition at discharge: good  The results of significant diagnostics from this hospitalization (including imaging, microbiology, ancillary and laboratory) are listed below for reference.   Imaging Studies: CT Maxillofacial W Contrast Result Date: 07/25/2023 CLINICAL DATA:  Provided history: Facial abscess.  Facial swelling. EXAM: CT MAXILLOFACIAL WITH CONTRAST TECHNIQUE: Multidetector CT imaging of the maxillofacial structures was performed with intravenous contrast. Multiplanar CT image reconstructions were also generated. RADIATION DOSE REDUCTION: This exam was performed according to the departmental dose-optimization program which includes automated exposure control, adjustment of the mA and/or kV according to patient size and/or use of iterative reconstruction technique. CONTRAST:  75mL OMNIPAQUE IOHEXOL 300 MG/ML  SOLN COMPARISON:  Radiographs of the orbits 01/28/2008. FINDINGS: Osseous: No acute fracture or aggressive osseous lesion. Orbits: No acute orbital finding. Sinuses: No significant paranasal sinus disease. Soft tissues: Nasal soft tissue swelling. Surrounding facial and upper neck soft tissue edema/stranding (right greater than left). Additionally,  there is soft tissue induration along the ventrolateral aspect of the maxilla on the right, likely reflecting phlegmon. No soft tissue abscess is identified. Limited intracranial: No evidence of an acute intracranial abnormality within the field of view. IMPRESSION: Nasal soft tissue swelling. Surrounding facial and upper neck soft tissue edema/stranding (right greater than left). These findings are consistent with soft tissue infection/cellulitis. Additionally, there is soft tissue induration along the ventrolateral aspect of the maxilla on the right, likely reflecting phlegmon. No soft tissue  abscess is identified. Electronically Signed   By: Jackey Loge D.O.   On: 07/25/2023 13:51    Microbiology: Results for orders placed or performed during the hospital encounter of 07/25/23  Blood culture (routine x 2)     Status: None (Preliminary result)   Collection Time: 07/25/23 12:09 PM   Specimen: BLOOD LEFT ARM  Result Value Ref Range Status   Specimen Description BLOOD LEFT ARM ac  Final   Special Requests NONE  Final   Culture   Final    NO GROWTH < 24 HOURS Performed at [facility], [address], [place], [place] [address]    Report Status PENDING  Incomplete  Blood culture (routine x 2)     Status: None (Preliminary result)   Collection Time: 07/25/23 12:09 PM   Specimen: BLOOD  Result Value Ref Range Status   Specimen Description BLOOD  Final   Special Requests NONE  Final   Culture   Final    NO GROWTH < 24 HOURS Performed at [facility], [address] [place], [place] [address]    Report Status PENDING  Incomplete    Labs: CBC: Recent Labs  Lab 07/25/23 1209 07/26/23 0437  WBC 10.5 9.3  NEUTROABS 8.2*  --   HGB 18.3* 15.9  HCT 51.6 46.0  MCV 89.1 89.0  PLT 108* 103*   Basic Metabolic Panel: Recent Labs  Lab 07/25/23 1209 07/25/23 1444 07/26/23 0437  NA 135  --  134*  K 3.7  --  3.6  CL 98  --  99  CO2 26  --  25  GLUCOSE 181*  --  175*  BUN 13  --  14  CREATININE 0.82  --  0.78  CALCIUM 9.6  --  8.5*  MG  --  2.3  --   PHOS  --  3.1  --    Liver Function Tests: Recent Labs  Lab 07/25/23 1209  AST 30  ALT 63*  ALKPHOS 89  BILITOT 0.9  PROT 8.4*  ALBUMIN 4.5   CBG: Recent Labs  Lab 07/25/23 1637 07/25/23 2302 07/26/23 0749  GLUCAP 239* 199* 157*    Discharge time spent: greater than 45 minutes.  Signed: Kendell Bane, MD [facility] 07/26/2023

## 2023-07-27 ENCOUNTER — Telehealth: Payer: Self-pay

## 2023-07-27 LAB — HEMOGLOBIN A1C
Hgb A1c MFr Bld: 6.8 % — ABNORMAL HIGH (ref 4.8–5.6)
Mean Plasma Glucose: 148 mg/dL

## 2023-07-27 NOTE — Transitions of Care (Post Inpatient/ED Visit) (Signed)
07/27/2023  Name: Steve Williams MRN: [id number] DOB: 1956-03-08  Today's TOC FU Call Status: Today's TOC FU Call Status:: Successful TOC FU Call Completed TOC FU Call Complete Date: 07/27/23 Patient's Name and Date of Birth confirmed.  Transition Care Management Follow-up Telephone Call Date of Discharge: 07/26/23 Dis[facility] Type of Discharge: Inpatient Admission Primary Inpatient Discharge Diagnosis:: Facial Cellulitis How have you been since you were released from the hospital?: Better Any questions or concerns?: No  Items Reviewed: Did you receive and understand the discharge instructions provided?: Yes Any new allergies since your discharge?: No Dietary orders reviewed?: Yes Type of Diet Ordered:: Carbohydrate Modified Do you have support at home?: Yes People in Home: spouse Name of Support/Comfort Primary Source: Steve Williams  Medications Reviewed Today: Medications Reviewed Today     Reviewed by [name], RN (Case Manager) on 07/27/23 at 1308  Med List Status: <None>   Medication Order Taking? Sig Documenting Provider Last Dose Status Informant  acetaminophen (TYLENOL) 500 MG tablet [id number] Yes Take 1,000 mg by mouth every 6 (six) hours as needed for mild pain (pain score 1-3). [provider] Taking Active Self  aspirin 81 MG tablet [id number] Yes Take 81 mg by mouth daily. [provider] Taking Active Self  B-D 3CC LUER-LOK SYR 22GX1" 22G X 1" 3 ML MISC [id number] Yes USE TO GIVE TESTOSTERONE Dettinger, Elige Radon, MD Taking Active Self  BD DISP NEEDLES 18G X 1-1/2" MISC [id number] Yes USE TO draw UP testosterone Marcine Matar, MD Taking Active Self  clindamycin (CLEOCIN) 300 MG capsule [id number] Yes Take 1 capsule (300 mg total) by mouth 3 (three) times daily for 7 days. Kendell Bane, MD Taking Active   fluticasone (FLONASE) 50 MCG/ACT nasal spray [id number] Yes Place 1 spray into both nostrils daily. [provider] Taking Active Self  hydrochlorothiazide (HYDRODIURIL) 25 MG tablet [id number] Yes Take 1 tablet (25 mg total) by mouth daily. Dettinger, Elige Radon, MD Taking Active Self  lactobacillus acidophilus & bulgar (LACTINEX) chewable tablet [id number] Yes Chew 1 tablet by mouth 3 (three) times daily with meals for 10 days. Kendell Bane, MD Taking Active   loratadine (CLARITIN) 10 MG tablet [id number] Yes Take 10 mg by mouth daily. [provider] Taking Active Self  losartan (COZAAR) 100 MG tablet [id number] Yes Take 1 tablet (100 mg total) by mouth daily. Dettinger, Elige Radon, MD Taking Active Self  meloxicam (MOBIC) 15 MG tablet [id number] Yes Take 1 tablet (15 mg total) by mouth daily. Dettinger, Elige Radon, MD Taking Active Self  montelukast (SINGULAIR) 10 MG tablet [id number] Yes Take 1 tablet (10 mg total) by mouth at bedtime. Dettinger, Elige Radon, MD Taking Active Self  sertraline (ZOLOFT) 100 MG tablet [id number] Yes Take 1 tablet (100 mg total) by mouth daily. Dettinger, Elige Radon, MD Taking Active Self  simvastatin (ZOCOR) 40 MG tablet [id number] Yes Take 1 tablet (40 mg total) by mouth daily. Dettinger, Elige Radon, MD Taking Active Self  Syringe/Needle, Disp, 18G X 1" 3 ML MISC [id number] Yes Use as directed Use to draw up testosterone Dettinger, Elige Radon, MD Taking Active Self  testosterone cypionate (DEPOTESTOTERONE CYPIONATE) 100 MG/ML injection [id number] Yes INJECT 1 ML INTRAMUSCULARLY EVERY 7 DAYS Marcine Matar, MD Taking Active Self            Home Care and Equipment/Supplies: Were Home Health Services Ordered?: No Any new equipment or medical supplies ordered?: No  Functional Questionnaire: Do you need assistance  with bathing/showering or dressing?: No Do you need assistance with meal preparation?: No Do you need assistance with eating?: No Do you have difficulty maintaining continence: No Do you need assistance with getting out of bed/getting out of a  chair/moving?: No Do you have difficulty managing or taking your medications?: No  Follow up appointments reviewed: PCP Follow-up appointment confirmed?: Yes Date of PCP follow-up appointment?: 07/30/23 Follow-up Provider: Dr. Louanne Skye Specialist [facility] Follow-up appointment confirmed?: NA Do you need transportation to your follow-up appointment?: No Do you understand care options if your condition(s) worsen?: Yes-patient verbalized understanding  SDOH Interventions Today    Flowsheet Row Most Recent Value  SDOH Interventions   Food Insecurity Interventions Intervention Not Indicated  Housing Interventions Intervention Not Indicated  Transportation Interventions Intervention Not Indicated  Utilities Interventions Intervention Not Indicated      [name] RN, BSN, CCM RN Care Manager  Transitions of Care  [facility]  [phone]

## 2023-07-30 ENCOUNTER — Ambulatory Visit: Payer: Medicare Other

## 2023-07-30 ENCOUNTER — Encounter: Payer: Self-pay | Admitting: Family

## 2023-07-30 VITALS — BP 131/69 | HR 80 | Temp 97.6°F | Ht 67.0 in | Wt 250.0 lb

## 2023-07-30 DIAGNOSIS — Z09 Encounter for follow-up examination after completed treatment for conditions other than malignant neoplasm: Secondary | ICD-10-CM

## 2023-07-30 DIAGNOSIS — E1169 Type 2 diabetes mellitus with other specified complication: Secondary | ICD-10-CM

## 2023-07-30 DIAGNOSIS — L03211 Cellulitis of face: Secondary | ICD-10-CM

## 2023-07-30 LAB — CULTURE, BLOOD (ROUTINE X 2)
Culture: NO GROWTH
Culture: NO GROWTH

## 2023-07-30 NOTE — Progress Notes (Signed)
Subjective:    Patient ID: Steve Williams, male    DOB: Feb 19, 1956, 67 y.o.   MRN: [id number]  Chief Complaint  Patient presents with   Hospitalization Follow-up    Cellulitis     HPI Pt presents to the office today for hospital follow up. He went to ED on 07/23/23 for facial abscess on right nasal with swelling in his cheeks. He was given bactrim and was discharged. Then the swelling worsen and erythemas and went back to ED on 07/25/23. He was given IV Vancomycin. He had a CT facial/maxillary IMPRESSION: Nasal soft tissue swelling. Surrounding facial and upper neck soft tissue edema/stranding (right greater than left). These findings are consistent with soft tissue infection/cellulitis. Additionally, there is soft tissue induration along the ventrolateral aspect of the maxilla on the right, likely reflecting phlegmon. No soft tissue abscess is identified.  His swelling improved and has been on afebrile. He was discharged on clindamycin 300 mg TID for 7 days. Denies any pain at this time.    Review of Systems  All other systems reviewed and are negative.   Social History   Socioeconomic History   Marital status: Legally Separated    Spouse name: Not on file   Number of children: 2   Years of education: Not on file   Highest education level: Master's degree (e.g., MA, MS, MEng, MEd, MSW, MBA)  Occupational History   Occupation: metal scrapping part time    Employer: Production assistant, [employer] SELF EMPLOYED  Tobacco Use   Smoking status: Never   Smokeless tobacco: Never  Vaping Use   Vaping status: Never Used  Substance and Sexual Activity   Alcohol use: No   Drug use: No   Sexual activity: Not on file  Other Topics Concern   Not on file  Social History Narrative   One son lives in [place], other son in [place]   Social Drivers of Health   Financial Resource Strain: Low Risk  (11/02/2022)   Overall Financial Resource Strain (CARDIA)    Difficulty of Paying Living  Expenses: Not hard at all  Food Insecurity: No Food Insecurity (07/27/2023)   Hunger Vital Sign    Worried About Running Out of Food in the Last Year: Never true    Ran Out of Food in the Last Year: Never true  Transportation Needs: No Transportation Needs (07/27/2023)   PRAPARE - Administrator, Civil Service (Medical): No    Lack of Transportation (Non-Medical): No  Physical Activity: Sufficiently Active (11/02/2022)   Exercise Vital Sign    Days of Exercise per Week: 4 days    Minutes of Exercise per Session: 100 min  Recent Concern: Physical Activity - Insufficiently Active (08/29/2022)   Exercise Vital Sign    Days of Exercise per Week: 3 days    Minutes of Exercise per Session: 30 min  Stress: No Stress Concern Present (11/02/2022)   [employer] of Occupational Health - Occupational Stress Questionnaire    Feeling of Stress : Not at all  Social Connections: Unknown (11/02/2022)   Social Connection and Isolation Panel [NHANES]    Frequency of Communication with Friends and Family: More than three times a week    Frequency of Social Gatherings with Friends and Family: More than three times a week    Attends Religious Services: Patient declined    Active Member of Clubs or Organizations: No    Attends Banker Meetings: 1 to 4 times per year  Marital Status: Separated  Recent Concern: Social Connections - Moderately Isolated (08/29/2022)   Social Connection and Isolation Panel [NHANES]    Frequency of Communication with Friends and Family: More than three times a week    Frequency of Social Gatherings with Friends and Family: More than three times a week    Attends Religious Services: Never    Database administrator or Organizations: Yes    Attends Engineer, structural: 1 to 4 times per year    Marital Status: Separated   Family History  Problem Relation Age of Onset   Heart disease Mother    Hypertension Mother    Diabetes Mother    Heart  disease Father    Cancer Father        liver   Diabetes Father    Hypertension Sister    Diabetes Sister    Migraines Son         Objective:   Physical Exam Vitals reviewed.  Constitutional:      General: He is not in acute distress.    Appearance: He is well-developed. He is obese.  HENT:     Head: Normocephalic.     Right Ear: Tympanic membrane normal.     Left Ear: Tympanic membrane normal.  Eyes:     General:        Right eye: No discharge.        Left eye: No discharge.     Pupils: Pupils are equal, round, and reactive to light.  Neck:     Thyroid: No thyromegaly.  Cardiovascular:     Rate and Rhythm: Normal rate and regular rhythm.     Heart sounds: Normal heart sounds. No murmur heard. Pulmonary:     Effort: Pulmonary effort is normal. No respiratory distress.     Breath sounds: Normal breath sounds. No wheezing.  Abdominal:     General: Bowel sounds are normal. There is no distension.     Palpations: Abdomen is soft.     Tenderness: There is no abdominal tenderness.  Musculoskeletal:        General: No tenderness. Normal range of motion.     Cervical back: Normal range of motion and neck supple.  Skin:    General: Skin is warm and dry.     Findings: No erythema or rash.  Neurological:     Mental Status: He is alert and oriented to person, place, and time.     Cranial Nerves: No cranial nerve deficit.     Deep Tendon Reflexes: Reflexes are normal and symmetric.  Psychiatric:        Behavior: Behavior normal.        Thought Content: Thought content normal.        Judgment: Judgment normal.       BP 131/69   Pulse 80   Temp 97.6 F (36.4 C)   Ht 5\' 7"  (1.702 m)   Wt 250 lb (113.4 kg)   SpO2 98%   BMI 39.16 kg/m      Assessment & Plan:  NIVEK DUPUY comes in today with chief complaint of Hospitalization Follow-up (Cellulitis )   Diagnosis and orders addressed:  1. Type 2 diabetes mellitus with other specified complication, without  long-term current use of insulin (HCC) (Primary) Continue low carb diet   2. Facial cellulitis Continue Clindamycin   3. Hospital discharge follow-up  Hospital notes reviewed    Labs reviewed  Continue current medications  Keep follow up  with specialists  Health Maintenance reviewed Diet and exercise encouraged  Return if symptoms worsen or fail to improve.    Jannifer Rodney, FNP

## 2023-07-30 NOTE — Patient Instructions (Signed)

## 2023-09-04 ENCOUNTER — Telehealth: Payer: Self-pay | Admitting: Family Medicine

## 2023-09-04 NOTE — Telephone Encounter (Signed)
 Copied from CRM [id number]. Topic: Appointments - Scheduling Inquiry for Clinic >> Sep 04, 2023  9:51 AM Nathanel DEL wrote: Reason for CRM: pt states he gets knee injection every 3 months.  Pt states it is time and his knee is really hurting.  Would like to know if Ronal Quant, [place], or anyone else that can do an injection, will work the pt in for tomorrow, Feb 5.  Pt declined to take first available, Friday.  Please advise.

## 2023-09-05 ENCOUNTER — Encounter: Payer: Self-pay | Admitting: Family Medicine

## 2023-09-05 ENCOUNTER — Telehealth: Payer: Self-pay

## 2023-09-05 ENCOUNTER — Ambulatory Visit: Payer: Medicare Other

## 2023-09-05 ENCOUNTER — Ambulatory Visit: Payer: Medicare Other | Admitting: Family Medicine

## 2023-09-05 VITALS — BP 104/55 | HR 76 | Temp 97.9°F | Ht 67.0 in | Wt 252.0 lb

## 2023-09-05 DIAGNOSIS — R051 Acute cough: Secondary | ICD-10-CM

## 2023-09-05 DIAGNOSIS — M25562 Pain in left knee: Secondary | ICD-10-CM

## 2023-09-05 DIAGNOSIS — R0981 Nasal congestion: Secondary | ICD-10-CM | POA: Diagnosis not present

## 2023-09-05 DIAGNOSIS — G8929 Other chronic pain: Secondary | ICD-10-CM | POA: Diagnosis not present

## 2023-09-05 DIAGNOSIS — J101 Influenza due to other identified influenza virus with other respiratory manifestations: Secondary | ICD-10-CM

## 2023-09-05 DIAGNOSIS — M25561 Pain in right knee: Secondary | ICD-10-CM

## 2023-09-05 LAB — VERITOR FLU A/B WAIVED
Influenza A: POSITIVE — AB
Influenza B: NEGATIVE

## 2023-09-05 MED ORDER — METHYLPREDNISOLONE ACETATE 40 MG/ML IJ SUSP
40.0000 mg | Freq: Once | INTRAMUSCULAR | Status: AC
  Administered 2023-09-05: 40 mg via INTRA_ARTICULAR

## 2023-09-05 NOTE — Progress Notes (Signed)
 Subjective:  Patient ID: Steve Williams, male    DOB: 05/28/1956, 68 y.o.   MRN: [id number]  Patient Care Team: Dettinger, Fonda LABOR, MD as PCP - General (Family Medicine) Karis Daniel Motts, MD as Referring Physician (Otolaryngology) Hermina Rush, MD (Chiropractic Medicine) Matilda Senior, MD as Consulting Physician (Urology) [facility]   Chief Complaint:  Knee Pain (Last knee injections 11/5 gets them every 90 days), Cough (Symptoms started yesterday got around a bunch of dust on monday), Nasal Congestion, and Fever   HPI: Steve Williams is a 68 y.o. male presenting on 09/05/2023 for Knee Pain (Last knee injections 11/5 gets them every 90 days), Cough (Symptoms started yesterday got around a bunch of dust on monday), Nasal Congestion, and Fever   Discussed the use of AI scribe software for clinical note transcription with the patient, who gave verbal consent to proceed.  History of Present Illness   Steve Williams is a 68 year old male who presents with flu symptoms and knee pain for injections.  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 He has a fever of 103.69F, along with coughing, sneezing, and nasal congestion. No COVID or flu tests have been taken at home, but he was swabbed for flu during the visit.  He has a long-standing history of knee pain requiring injections approximately every 90 days. The effectiveness of the injections has decreased over time, previously lasting a year but now only providing relief for about 90 days. The last injection was on June 05, 2023, and he is due for another injection today.  He has a history of elevated blood sugars, which are affected by the knee injections. His A1c has been as high as 7.3-7.4% but improved to the 5% range after losing 70 pounds on a protein diet. He currently does not monitor his sugar  intake closely, and his last A1c was around 6.2%.  He was hospitalized on Christmas Day for facial swelling due to an infected nasal spot, which was treated with antibiotics.          Relevant past medical, surgical, family, and social history reviewed and updated as indicated.  Allergies and medications reviewed and updated. Data reviewed: Chart in Epic.   Past Medical History:  Diagnosis Date   Allergy    Anxiety    Arthritis    Asthma    Cancer (HCC)    Depression    Diabetes mellitus without complication (HCC)    Hyperlipidemia    Hypertension     Past Surgical History:  Procedure Laterality Date   CARPAL BONE EXCISION Bilateral    KNEE ARTHROSCOPY Left    KNEE SURGERY Left 1974   lipoma removed     TRIGGER FINGER RELEASE Right     Social History   Socioeconomic History   Marital status: Legally Separated    Spouse name: Not on file   Number of children: 2   Years of education: Not on file   Highest education level: Master's degree (e.g., MA, MS, MEng, MEd, MSW, MBA)  Occupational History   Occupation: metal scrapping part time    Employer: [employer]  Tobacco Use   Smoking status: Never   Smokeless tobacco: Never  Vaping Use   Vaping status: Never Used  Substance and Sexual Activity   Alcohol use: No  Drug use: No   Sexual activity: Not on file  Other Topics Concern   Not on file  Social History Narrative   One son lives in [place], other son in [place]   Social Drivers of Health   Financial Resource Strain: Low Risk  (11/02/2022)   Overall Financial Resource Strain (CARDIA)    Difficulty of Paying Living Expenses: Not hard at all  Food Insecurity: No Food Insecurity (07/27/2023)   Hunger Vital Sign    Worried About Running Out of Food in the Last Year: Never true    Ran Out of Food in the Last Year: Never true  Transportation Needs: No Transportation Needs (07/27/2023)   PRAPARE - Scientist, Research (physical Sciences) (Medical): No    Lack of Transportation (Non-Medical): No  Physical Activity: Sufficiently Active (11/02/2022)   Exercise Vital Sign    Days of Exercise per Week: 4 days    Minutes of Exercise per Session: 100 min  Recent Concern: Physical Activity - Insufficiently Active (08/29/2022)   Exercise Vital Sign    Days of Exercise per Week: 3 days    Minutes of Exercise per Session: 30 min  Stress: No Stress Concern Present (11/02/2022)   [employer] of Occupational Health - Occupational Stress Questionnaire    Feeling of Stress : Not at all  Social Connections: Unknown (11/02/2022)   Social Connection and Isolation Panel [NHANES]    Frequency of Communication with Friends and Family: More than three times a week    Frequency of Social Gatherings with Friends and Family: More than three times a week    Attends Religious Services: Patient declined    Database Administrator or Organizations: No    Attends Engineer, Structural: Not on file    Marital Status: Separated  Recent Concern: Social Connections - Moderately Isolated (08/29/2022)   Social Connection and Isolation Panel [NHANES]    Frequency of Communication with Friends and Family: More than three times a week    Frequency of Social Gatherings with Friends and Family: More than three times a week    Attends Religious Services: Never    Database Administrator or Organizations: Yes    Attends Banker Meetings: 1 to 4 times per year    Marital Status: Separated  Intimate Partner Violence: Not At Risk (07/27/2023)   Humiliation, Afraid, Rape, and Kick questionnaire    Fear of Current or Ex-Partner: No    Emotionally Abused: No    Physically Abused: No    Sexually Abused: No    Outpatient Encounter Medications as of 09/05/2023  Medication Sig   acetaminophen  (TYLENOL ) 500 MG tablet Take 1,000 mg by mouth every 6 (six) hours as needed for mild pain (pain score 1-3).   aspirin  81 MG tablet Take 81 mg  by mouth daily.   B-D 3CC LUER-LOK SYR 22GX1 22G X 1 3 ML MISC USE TO GIVE TESTOSTERONE    BD DISP NEEDLES 18G X 1-1/2 MISC USE TO draw UP testosterone    fluticasone  (FLONASE ) 50 MCG/ACT nasal spray Place 1 spray into both nostrils daily.   hydrochlorothiazide  (HYDRODIURIL ) 25 MG tablet Take 1 tablet (25 mg total) by mouth daily.   loratadine  (CLARITIN ) 10 MG tablet Take 10 mg by mouth daily.   losartan  (COZAAR ) 100 MG tablet Take 1 tablet (100 mg total) by mouth daily.   meloxicam  (MOBIC ) 15 MG tablet Take 1 tablet (15 mg total) by mouth daily.   montelukast  (  SINGULAIR ) 10 MG tablet Take 1 tablet (10 mg total) by mouth at bedtime.   sertraline  (ZOLOFT ) 100 MG tablet Take 1 tablet (100 mg total) by mouth daily.   simvastatin  (ZOCOR ) 40 MG tablet Take 1 tablet (40 mg total) by mouth daily.   Syringe/Needle, Disp, 18G X 1 3 ML MISC Use as directed Use to draw up testosterone    testosterone  cypionate (DEPOTESTOTERONE CYPIONATE) 100 MG/ML injection INJECT 1 ML INTRAMUSCULARLY EVERY 7 DAYS   [EXPIRED] methylPREDNISolone  acetate (DEPO-MEDROL ) injection 40 mg    [EXPIRED] methylPREDNISolone  acetate (DEPO-MEDROL ) injection 40 mg    No facility-administered encounter medications on file as of 09/05/2023.    Allergies  Allergen Reactions   Lisinopril    Erythromycin Rash    Pertinent ROS per HPI, otherwise unremarkable      Objective:  BP (!) 104/55   Pulse 76   Temp 97.9 F (36.6 C) (Temporal)   Ht 5' 7 (1.702 m)   Wt 252 lb (114.3 kg)   SpO2 97%   BMI 39.47 kg/m    Wt Readings from Last 3 Encounters:  09/05/23 252 lb (114.3 kg)  07/30/23 250 lb (113.4 kg)  07/26/23 242 lb (109.8 kg)    Physical Exam Vitals and nursing note reviewed.  Constitutional:      General: He is not in acute distress.    Appearance: Normal appearance. He is obese. He is not ill-appearing, toxic-appearing or diaphoretic.  HENT:     Head: Normocephalic and atraumatic.     Nose: Congestion present.      Mouth/Throat:     Mouth: Mucous membranes are moist.  Eyes:     Conjunctiva/sclera: Conjunctivae normal.     Pupils: Pupils are equal, round, and reactive to light.  Cardiovascular:     Rate and Rhythm: Normal rate and regular rhythm.     Heart sounds: Normal heart sounds.  Pulmonary:     Effort: Pulmonary effort is normal.     Breath sounds: Normal breath sounds.  Musculoskeletal:     Cervical back: Neck supple.     Right upper leg: Normal.     Left upper leg: Normal.     Right knee: Swelling and effusion present. Tenderness present.     Left knee: Swelling and effusion present. Tenderness present.     Right lower leg: Normal.     Left lower leg: Normal.     Comments: VV noted to bilateral lower extremities, right worse than left  Skin:    General: Skin is warm and dry.     Capillary Refill: Capillary refill takes less than 2 seconds.  Neurological:     General: No focal deficit present.     Mental Status: He is alert and oriented to person, place, and time.  Psychiatric:        Mood and Affect: Mood normal.        Behavior: Behavior normal.        Thought Content: Thought content normal.        Judgment: Judgment normal.     Joint Injection/Arthrocentesis  Date/Time: 09/05/2023 1:57 PM  Performed by: Severa Rock HERO, FNP Authorized by: Severa Rock HERO, FNP  Indications: joint swelling and pain  Body area: knee Joint: right knee Local anesthesia used: yes  Anesthesia: Local anesthesia used: yes Local Anesthetic: co-phenylcaine spray  Sedation: Patient sedated: no  Preparation: Patient was prepped and draped in the usual sterile fashion. Needle size: 22 G Ultrasound guidance: no Approach: anterior Aspirate  amount: 0 mL Methylprednisolone  amount: 40 mg Lidocaine  2% amount: 3.5 mL Patient tolerance: patient tolerated the procedure well with no immediate complications   Joint Injection/Arthrocentesis  Date/Time: 09/05/2023 1:58 PM  Performed by: Severa Rock HERO, FNP Authorized by: Severa Rock HERO, FNP  Indications: joint swelling and pain  Body area: knee Joint: left knee Local anesthesia used: yes  Anesthesia: Local anesthesia used: yes Local Anesthetic: co-phenylcaine spray  Sedation: Patient sedated: no  Preparation: Patient was prepped and draped in the usual sterile fashion. Needle size: 22 G Ultrasound guidance: no Approach: anterior Aspirate amount: 0 mL Methylprednisolone  amount: 40 mg Lidocaine  2% amount: 3.5 mL Patient tolerance: patient tolerated the procedure well with no immediate complications      Results for orders placed or performed during the hospital encounter of 07/25/23  Blood culture (routine x 2)   Collection Time: 07/25/23 12:09 PM   Specimen: BLOOD LEFT ARM  Result Value Ref Range   Specimen Description BLOOD LEFT ARM ac    Special Requests NONE    Culture      NO GROWTH 5 DAYS Performed at [facility], [address], [place], [place] [address]    Report Status 07/30/2023 FINAL   Blood culture (routine x 2)   Collection Time: 07/25/23 12:09 PM   Specimen: BLOOD  Result Value Ref Range   Specimen Description BLOOD    Special Requests NONE    Culture      NO GROWTH 5 DAYS Performed at [facility], [address], [place], [place] [address]    Report Status 07/30/2023 FINAL   Comprehensive metabolic panel   Collection Time: 07/25/23 12:09 PM  Result Value Ref Range   Sodium 135 135 - 145 mmol/L   Potassium 3.7 3.5 - 5.1 mmol/L   Chloride 98 98 - 111 mmol/L   CO2 26 22 - 32 mmol/L   Glucose, Bld 181 (H) 70 - 99 mg/dL   BUN 13 8 - 23 mg/dL   Creatinine, Ser 9.17 0.61 - 1.24 mg/dL   Calcium 9.6 8.9 - 89.6 mg/dL   Total Protein 8.4 (H) 6.5 - 8.1 g/dL   Albumin 4.5 3.5 - 5.0 g/dL   AST 30 15 - 41 U/L   ALT 63 (H) 0 - 44 U/L   Alkaline Phosphatase 89 38 - 126 U/L   Total Bilirubin 0.9 <1.2 mg/dL   GFR, Estimated >39 >39 mL/min   Anion gap 11 5 - 15  CBC with Differential    Collection Time: 07/25/23 12:09 PM  Result Value Ref Range   WBC 10.5 4.0 - 10.5 K/uL   RBC 5.79 4.22 - 5.81 MIL/uL   Hemoglobin 18.3 (H) 13.0 - 17.0 g/dL   HCT 48.3 60.9 - 47.9 %   MCV 89.1 80.0 - 100.0 fL   MCH 31.6 26.0 - 34.0 pg   MCHC 35.5 30.0 - 36.0 g/dL   RDW 86.3 88.4 - 84.4 %   Platelets 108 (L) 150 - 400 K/uL   nRBC 0.0 0.0 - 0.2 %   Neutrophils Relative % 77 %   Neutro Abs 8.2 (H) 1.7 - 7.7 K/uL   Lymphocytes Relative 11 %   Lymphs Abs 1.1 0.7 - 4.0 K/uL   Monocytes Relative 9 %   Monocytes Absolute 1.0 0.1 - 1.0 K/uL   Eosinophils Relative 1 %   Eosinophils Absolute 0.1 0.0 - 0.5 K/uL   Basophils Relative 1 %   Basophils Absolute 0.1 0.0 - 0.1 K/uL  Immature Granulocytes 1 %   Abs Immature Granulocytes 0.09 (H) 0.00 - 0.07 K/uL  Lactic acid, plasma   Collection Time: 07/25/23 12:09 PM  Result Value Ref Range   Lactic Acid, Venous 1.6 0.5 - 1.9 mmol/L  Hemoglobin A1c   Collection Time: 07/25/23 12:09 PM  Result Value Ref Range   Hgb A1c MFr Bld 6.8 (H) 4.8 - 5.6 %   Mean Plasma Glucose 148 mg/dL  Magnesium   Collection Time: 07/25/23  2:44 PM  Result Value Ref Range   Magnesium 2.3 1.7 - 2.4 mg/dL  Phosphorus   Collection Time: 07/25/23  2:44 PM  Result Value Ref Range   Phosphorus 3.1 2.5 - 4.6 mg/dL  Glucose, capillary   Collection Time: 07/25/23  4:37 PM  Result Value Ref Range   Glucose-Capillary 239 (H) 70 - 99 mg/dL  Glucose, capillary   Collection Time: 07/25/23 11:02 PM  Result Value Ref Range   Glucose-Capillary 199 (H) 70 - 99 mg/dL  Basic metabolic panel   Collection Time: 07/26/23  4:37 AM  Result Value Ref Range   Sodium 134 (L) 135 - 145 mmol/L   Potassium 3.6 3.5 - 5.1 mmol/L   Chloride 99 98 - 111 mmol/L   CO2 25 22 - 32 mmol/L   Glucose, Bld 175 (H) 70 - 99 mg/dL   BUN 14 8 - 23 mg/dL   Creatinine, Ser 9.21 0.61 - 1.24 mg/dL   Calcium 8.5 (L) 8.9 - 10.3 mg/dL   GFR, Estimated >39 >39 mL/min   Anion gap 10 5 - 15  CBC    Collection Time: 07/26/23  4:37 AM  Result Value Ref Range   WBC 9.3 4.0 - 10.5 K/uL   RBC 5.17 4.22 - 5.81 MIL/uL   Hemoglobin 15.9 13.0 - 17.0 g/dL   HCT 53.9 60.9 - 47.9 %   MCV 89.0 80.0 - 100.0 fL   MCH 30.8 26.0 - 34.0 pg   MCHC 34.6 30.0 - 36.0 g/dL   RDW 86.7 88.4 - 84.4 %   Platelets 103 (L) 150 - 400 K/uL   nRBC 0.0 0.0 - 0.2 %  Protime-INR   Collection Time: 07/26/23  4:37 AM  Result Value Ref Range   Prothrombin Time 14.0 11.4 - 15.2 seconds   INR 1.1 0.8 - 1.2  APTT   Collection Time: 07/26/23  4:37 AM  Result Value Ref Range   aPTT 30 24 - 36 seconds  Glucose, capillary   Collection Time: 07/26/23  7:49 AM  Result Value Ref Range   Glucose-Capillary 157 (H) 70 - 99 mg/dL   Comment 1 Notify RN    Comment 2 Document in Chart   HIV Antibody (routine testing w rflx)   Collection Time: 07/26/23  9:45 AM  Result Value Ref Range   HIV Screen 4th Generation wRfx Non Reactive Non Reactive       Pertinent labs & imaging results that were available during my care of the patient were reviewed by me and considered in my medical decision making.  Assessment & Plan:  Wilkes Potvin was seen today for knee pain, cough, nasal congestion and fever.  Diagnoses and all orders for this visit:  Chronic pain of both knees -     methylPREDNISolone  acetate (DEPO-MEDROL ) injection 40 mg -     methylPREDNISolone  acetate (DEPO-MEDROL ) injection 40 mg -     Joint Injection/Arthrocentesis -     Joint Injection/Arthrocentesis  Acute cough -  Veritor Flu A/B Waived  Nasal congestion -     Veritor Flu A/B Waived  Influenza A Symptomatic care discussed in detail.     Assessment and Plan    Influenza A Acute onset of fever (103.39F), cough, sneezing, nasal congestion, and facial pressure starting Monday. Positive Flu A test. Discussed symptomatic care and limited benefit of Tamiflu due to time course and potential side effects (nausea, vomiting, diarrhea). Advised that  symptomatic care includes fluids, acetaminophen  for fever, and guaifenesin . Informed he is contagious until fever-free for 24 hours without medication. No flu shot needed until next season as he will build antibodies to the current strain. - Symptomatic care with fluids, acetaminophen  for fever, and guaifenesin  - Remain fever-free for 24 hours without medication before returning to work - No flu shot needed until next season  Type 2 Diabetes Mellitus Type 2 Diabetes with recent A1c of 6.2. Reports dietary changes and significant weight loss (70 pounds) have improved glycemic control. Knee injections may temporarily elevate blood glucose levels. Discussed importance of monitoring blood glucose post-injection and continuing current dietary and lifestyle modifications. - Monitor blood glucose levels post-injection - Continue current dietary and lifestyle modifications  Knee Osteoarthritis Chronic bilateral knee pain managed with intra-articular injections every 90 days. Last injection on June 05, 2023. Reports good relief for most of the 90-day period but notes diminishing efficacy over time. Informed consent obtained for knee injections, including discussion of risks (elevated blood glucose), benefits (pain relief), and alternatives (knee replacement surgery). - Administer knee injections with lidocaine   Follow-up - Follow up with primary care provider as needed for diabetes management and any further complications from influenza.          Continue all other maintenance medications.  Follow up plan: Return if symptoms worsen or fail to improve.   Continue healthy lifestyle choices, including diet (rich in fruits, vegetables, and lean proteins, and low in salt and simple carbohydrates) and exercise (at least 30 minutes of moderate physical activity daily).  Educational handout given for knee injections   The above assessment and management plan was discussed with the patient. The  patient verbalized understanding of and has agreed to the management plan. Patient is aware to call the clinic if they develop any new symptoms or if symptoms persist or worsen. Patient is aware when to return to the clinic for a follow-up visit. Patient educated on when it is appropriate to go to the emergency department.   Rosaline Bruns, FNP-C [facility] [phone]

## 2023-09-05 NOTE — Telephone Encounter (Signed)
 Attempted to call pt no answer - pt was placed on triage nurse schedule by e2c2 for a double knee injection , this can not be on triage this has to be on a providers schedule - if pt calls back please get him r/s with pcp

## 2023-10-04 ENCOUNTER — Encounter: Payer: Self-pay | Admitting: Family Medicine

## 2023-10-04 ENCOUNTER — Ambulatory Visit: Admitting: Family Medicine

## 2023-10-04 VITALS — BP 155/93 | HR 72 | Temp 98.7°F | Ht 67.0 in | Wt 255.0 lb

## 2023-10-04 DIAGNOSIS — D696 Thrombocytopenia, unspecified: Secondary | ICD-10-CM

## 2023-10-04 DIAGNOSIS — I152 Hypertension secondary to endocrine disorders: Secondary | ICD-10-CM | POA: Diagnosis not present

## 2023-10-04 DIAGNOSIS — R42 Dizziness and giddiness: Secondary | ICD-10-CM | POA: Diagnosis not present

## 2023-10-04 DIAGNOSIS — E1159 Type 2 diabetes mellitus with other circulatory complications: Secondary | ICD-10-CM

## 2023-10-04 MED ORDER — MECLIZINE HCL 25 MG PO TABS: 12.5000 mg | ORAL_TABLET | Freq: Three times a day (TID) | ORAL | 0 refills | Status: DC | PRN

## 2023-10-04 NOTE — Patient Instructions (Signed)
 Vertigo supplements: - Magnesium citrate 400mg  daily - Riboflavin 400mg  daily (B2) - coenzyme Q10 100mg  three times daily  Make sure you are getting plenty of water and rest.  SAFE transitions from lying, sitting, standing

## 2023-10-04 NOTE — Progress Notes (Signed)
Subjective:  Patient ID: Steve Williams, male    DOB: April 21, 1956, 68 y.o.   MRN: [id number]  Patient Care Team: Dettinger, Elige Radon, MD as PCP - General (Family Medicine) Karle Barr, MD as Referring Physician (Otolaryngology) Donnetta Hail, MD (Chiropractic Medicine) Marcine Matar, MD as Consulting Physician (Urology) [facility] [place], [place]   Chief Complaint:  Hypertension (Dizziness/ headache)  HPI: Steve Williams is a 68 y.o. male presenting on 10/04/2023 for Hypertension (Dizziness/ headache)  Reports that about one month ago he had helped a friend move heavy object. States that he was dizzy upon standing. In addition, his BP was low at appt 09/05/23. States that he has been dizzy and lightheaded the last few days. States that he checked his BP this morning and it was high. Denies use of advil or other NSAIDs. He recently had the flu and took some mucinex then. Taking both losartan and hydrochlorothiazide. States that he is noticing it as he gets up out of bed.   Relevant past medical, surgical, family, and social history reviewed and updated as indicated.  Allergies and medications reviewed and updated. Data reviewed: Chart in Epic.   Past Medical History:  Diagnosis Date   Allergy    Anxiety    Arthritis    Asthma    Cancer (HCC)    Depression    Diabetes mellitus without complication (HCC)    Hyperlipidemia    Hypertension     Past Surgical History:  Procedure Laterality Date   CARPAL BONE EXCISION Bilateral    KNEE ARTHROSCOPY Left    KNEE SURGERY Left 1974   lipoma removed     TRIGGER FINGER RELEASE Right     Social History   Socioeconomic History   Marital status: Legally Separated    Spouse name: Not on file   Number of children: 2   Years of education: Not on file   Highest education level: Master's degree (e.g., MA, MS, MEng, MEd, MSW, MBA)  Occupational History   Occupation: metal scrapping part time    Employer: Production assistant, [employer]  FOR SELF EMPLOYED  Tobacco Use   Smoking status: Never   Smokeless tobacco: Never  Vaping Use   Vaping status: Never Used  Substance and Sexual Activity   Alcohol use: No   Drug use: No   Sexual activity: Not on file  Other Topics Concern   Not on file  Social History Narrative   One son lives in [place], other son in [place]   Social Drivers of Health   Financial Resource Strain: Low Risk  (11/02/2022)   Overall Financial Resource Strain (CARDIA)    Difficulty of Paying Living Expenses: Not hard at all  Food Insecurity: No Food Insecurity (07/27/2023)   Hunger Vital Sign    Worried About Running Out of Food in the Last Year: Never true    Ran Out of Food in the Last Year: Never true  Transportation Needs: No Transportation Needs (07/27/2023)   PRAPARE - Administrator, Civil Service (Medical): No    Lack of Transportation (Non-Medical): No  Physical Activity: Sufficiently Active (11/02/2022)   Exercise Vital Sign    Days of Exercise per Week: 4 days    Minutes of Exercise per Session: 100 min  Recent Concern: Physical Activity - Insufficiently Active (08/29/2022)   Exercise Vital Sign    Days of Exercise per Week: 3 days    Minutes of Exercise per Session: 30  min  Stress: No Stress Concern Present (11/02/2022)   [employer] of Occupational Health - Occupational Stress Questionnaire    Feeling of Stress : Not at all  Social Connections: Unknown (11/02/2022)   Social Connection and Isolation Panel [NHANES]    Frequency of Communication with Friends and Family: More than three times a week    Frequency of Social Gatherings with Friends and Family: More than three times a week    Attends Religious Services: Patient declined    Database administrator or Organizations: No    Attends Engineer, structural: Not on file    Marital Status: Separated  Recent Concern: Social Connections - Moderately Isolated (08/29/2022)   Social Connection and Isolation Panel  [NHANES]    Frequency of Communication with Friends and Family: More than three times a week    Frequency of Social Gatherings with Friends and Family: More than three times a week    Attends Religious Services: Never    Database administrator or Organizations: Yes    Attends Banker Meetings: 1 to 4 times per year    Marital Status: Separated  Intimate Partner Violence: Not At Risk (07/27/2023)   Humiliation, Afraid, Rape, and Kick questionnaire    Fear of Current or Ex-Partner: No    Emotionally Abused: No    Physically Abused: No    Sexually Abused: No    Outpatient Encounter Medications as of 10/04/2023  Medication Sig   acetaminophen (TYLENOL) 500 MG tablet Take 1,000 mg by mouth every 6 (six) hours as needed for mild pain (pain score 1-3).   aspirin 81 MG tablet Take 81 mg by mouth daily.   B-D 3CC LUER-LOK SYR 22GX1" 22G X 1" 3 ML MISC USE TO GIVE TESTOSTERONE   BD DISP NEEDLES 18G X 1-1/2" MISC USE TO draw UP testosterone   fluticasone (FLONASE) 50 MCG/ACT nasal spray Place 1 spray into both nostrils daily.   hydrochlorothiazide (HYDRODIURIL) 25 MG tablet Take 1 tablet (25 mg total) by mouth daily.   loratadine (CLARITIN) 10 MG tablet Take 10 mg by mouth daily.   losartan (COZAAR) 100 MG tablet Take 1 tablet (100 mg total) by mouth daily.   meloxicam (MOBIC) 15 MG tablet Take 1 tablet (15 mg total) by mouth daily.   montelukast (SINGULAIR) 10 MG tablet Take 1 tablet (10 mg total) by mouth at bedtime.   sertraline (ZOLOFT) 100 MG tablet Take 1 tablet (100 mg total) by mouth daily.   sildenafil (REVATIO) 20 MG tablet Take 20 mg by mouth as needed.   simvastatin (ZOCOR) 40 MG tablet Take 1 tablet (40 mg total) by mouth daily.   Syringe/Needle, Disp, 18G X 1" 3 ML MISC Use as directed Use to draw up testosterone   testosterone cypionate (DEPOTESTOTERONE CYPIONATE) 100 MG/ML injection INJECT 1 ML INTRAMUSCULARLY EVERY 7 DAYS   No facility-administered encounter  medications on file as of 10/04/2023.    Allergies  Allergen Reactions   Lisinopril    Erythromycin Rash    Review of Systems As per HPI  Objective:  BP (!) 155/93   Pulse 72   Temp 98.7 F (37.1 C)   Ht 5\' 7"  (1.702 m)   Wt 255 lb (115.7 kg)   SpO2 94%   BMI 39.94 kg/m    Wt Readings from Last 3 Encounters:  10/04/23 255 lb (115.7 kg)  09/05/23 252 lb (114.3 kg)  07/30/23 250 lb (113.4 kg)   Orthostatic VS  for the past 72 hrs (Last 3 readings):  Orthostatic BP Patient Position BP Location Cuff Size Orthostatic Pulse  10/04/23 1213 (!) 155/92 Standing Right Arm Large 77  10/04/23 1212 (!) 155/93 Sitting Right Arm Large 72  10/04/23 1211 162/87 Supine Right Arm Large 75   Physical Exam Constitutional:      General: He is awake. He is not in acute distress.    Appearance: Normal appearance. He is well-developed and well-groomed. He is not ill-appearing, toxic-appearing or diaphoretic.  HENT:     Head:     Comments: Positive dix-Hallpike maneuver Eyes:     General: No visual field deficit.    Extraocular Movements:     Right eye: Normal extraocular motion and no nystagmus.     Left eye: Nystagmus present. Normal extraocular motion.  Cardiovascular:     Rate and Rhythm: Normal rate and regular rhythm.     Pulses: Normal pulses.          Radial pulses are 2+ on the right side and 2+ on the left side.       Posterior tibial pulses are 2+ on the right side and 2+ on the left side.     Heart sounds: Normal heart sounds. No murmur heard.    No gallop.  Pulmonary:     Effort: Pulmonary effort is normal. No respiratory distress.     Breath sounds: Normal breath sounds. No stridor. No wheezing, rhonchi or rales.  Musculoskeletal:     Cervical back: Full passive range of motion without pain and neck supple.     Right lower leg: No edema.     Left lower leg: No edema.  Skin:    General: Skin is warm.     Capillary Refill: Capillary refill takes less than 2 seconds.   Neurological:     General: No focal deficit present.     Mental Status: He is alert, oriented to person, place, and time and easily aroused. Mental status is at baseline.     GCS: GCS eye subscore is 4. GCS verbal subscore is 5. GCS motor subscore is 6.     Cranial Nerves: No cranial nerve deficit, dysarthria or facial asymmetry.     Sensory: No sensory deficit.     Motor: No weakness, tremor or abnormal muscle tone.     Gait: Gait is intact.     Comments: Hand and arm strength 5/5 bilaterally   Psychiatric:        Attention and Perception: Attention and perception normal.        Mood and Affect: Mood and affect normal.        Speech: Speech normal.        Behavior: Behavior normal. Behavior is cooperative.        Thought Content: Thought content normal. Thought content does not include homicidal or suicidal ideation. Thought content does not include homicidal or suicidal plan.        Cognition and Memory: Cognition and memory normal.        Judgment: Judgment normal.    Results for orders placed or performed in visit on 09/05/23  Veritor Flu A/B Waived   Collection Time: 09/05/23 12:02 PM  Result Value Ref Range   Influenza A Positive (A) Negative   Influenza B Negative Negative       10/04/2023   12:15 PM 06/05/2023   10:31 AM 05/09/2023    8:04 AM 05/09/2023    8:03 AM 11/03/2022  8:18 AM  Depression screen PHQ 2/9  Decreased Interest 0 0  0 0  Down, Depressed, Hopeless 0 0  0 0  PHQ - 2 Score 0 0  0 0  Altered sleeping 0 0 0  0  Tired, decreased energy 0 0 0  0  Change in appetite 0 0 0  0  Feeling bad or failure about yourself  0 0 0  0  Trouble concentrating 0 0 0  0  Moving slowly or fidgety/restless 0 0 0  0  Suicidal thoughts 0 0 0  0  PHQ-9 Score 0 0   0  Difficult doing work/chores Not difficult at all Not difficult at all Not difficult at all  Not difficult at all       06/05/2023   10:31 AM 05/09/2023    8:03 AM 10/23/2022    8:16 AM 08/21/2022   11:14 AM   GAD 7 : Generalized Anxiety Score  Nervous, Anxious, on Edge 0 0 0 0  Control/stop worrying 0 0 0 0  Worry too much - different things 0 0 0 0  Trouble relaxing 0 0 0 0  Restless 0 0 0 0  Easily annoyed or irritable 0 0 0 0  Afraid - awful might happen 0 0 0 0  Total GAD 7 Score 0 0 0 0  Anxiety Difficulty Not difficult at all Not difficult at all Not difficult at all Not difficult at all   Pertinent labs & imaging results that were available during my care of the patient were reviewed by me and considered in my medical decision making.  Assessment & Plan:  Yida Hyams" was seen today for hypertension.  Diagnoses and all orders for this visit:  Dizziness Labs as below. Will communicate results to patient once available. Will await results to determine next steps.  Will start medication as below for suspected BPPV. Discussed with patient side effects of medication and risk with use due to age. Patient verbalized understanding. Patient to start with one half tablet and if tolerating, can increase to whole tablet.  -     Anemia Profile B -     CMP14+EGFR -     VITAMIN D 25 Hydroxy (Vit-D Deficiency, Fractures) -     Magnesium -     meclizine (ANTIVERT) 25 MG tablet; Take 0.5-1 tablets (12.5-25 mg total) by mouth 3 (three) times daily as needed for dizziness.  Hypertension associated with diabetes (HCC) Elevated BP in office. Cautious in changing antihypertensives as he had low BP previously. Discussed with patient to monitor BP at home and follow up with PCP. Discussed red flag symptoms.   Continue all other maintenance medications.  Follow up plan: Return if symptoms worsen or fail to improve.   Continue healthy lifestyle choices, including diet (rich in fruits, vegetables, and lean proteins, and low in salt and simple carbohydrates) and exercise (at least 30 minutes of moderate physical activity daily).  Written and verbal instructions provided   The above assessment and  management plan was discussed with the patient. The patient verbalized understanding of and has agreed to the management plan. Patient is aware to call the clinic if they develop any new symptoms or if symptoms persist or worsen. Patient is aware when to return to the clinic for a follow-up visit. Patient educated on when it is appropriate to go to the emergency department.   Neale Burly, DNP-FNP [facility] Medicine [address] [place], [place] [address] [phone]  [phone] 

## 2023-10-05 ENCOUNTER — Encounter: Payer: Self-pay | Admitting: Family Medicine

## 2023-10-05 ENCOUNTER — Other Ambulatory Visit

## 2023-10-05 DIAGNOSIS — D696 Thrombocytopenia, unspecified: Secondary | ICD-10-CM | POA: Diagnosis not present

## 2023-10-05 LAB — ANEMIA PROFILE B
Basophils Absolute: 0.1 10*3/uL (ref 0.0–0.2)
Basos: 1 %
EOS (ABSOLUTE): 0.1 10*3/uL (ref 0.0–0.4)
Eos: 2 %
Ferritin: 776 ng/mL — ABNORMAL HIGH (ref 30–400)
Folate: 8.8 ng/mL (ref >3.0)
Hematocrit: 49 % (ref 37.5–51.0)
Hemoglobin: 17 g/dL (ref 13.0–17.7)
Immature Grans (Abs): 0.1 10*3/uL (ref 0.0–0.1)
Immature Granulocytes: 1 %
Iron Saturation: 27 % (ref 15–55)
Iron: 75 ug/dL (ref 38–169)
Lymphocytes Absolute: 1.5 10*3/uL (ref 0.7–3.1)
Lymphs: 25 %
MCH: 31.4 pg (ref 26.6–33.0)
MCHC: 34.7 g/dL (ref 31.5–35.7)
MCV: 90 fL (ref 79–97)
Monocytes Absolute: 0.6 10*3/uL (ref 0.1–0.9)
Monocytes: 9 %
Neutrophils Absolute: 3.8 10*3/uL (ref 1.4–7.0)
Neutrophils: 62 %
Platelets: 96 10*3/uL — CL (ref 150–450)
RBC: 5.42 x10E6/uL (ref 4.14–5.80)
RDW: 13.8 % (ref 11.6–15.4)
Retic Ct Pct: 2.5 % (ref 0.6–2.6)
Total Iron Binding Capacity: 281 ug/dL (ref 250–450)
UIBC: 206 ug/dL (ref 111–343)
Vitamin B-12: 300 pg/mL (ref 232–1245)
WBC: 6.1 10*3/uL (ref 3.4–10.8)

## 2023-10-05 LAB — CMP14+EGFR
ALT: 23 IU/L (ref 0–44)
AST: 17 IU/L (ref 0–40)
Albumin: 4.3 g/dL (ref 3.9–4.9)
Alkaline Phosphatase: 83 IU/L (ref 44–121)
BUN/Creatinine Ratio: 16 (ref 10–24)
BUN: 12 mg/dL (ref 8–27)
Bilirubin Total: 0.6 mg/dL (ref 0.0–1.2)
CO2: 25 mmol/L (ref 20–29)
Calcium: 8.9 mg/dL (ref 8.6–10.2)
Chloride: 100 mmol/L (ref 96–106)
Creatinine, Ser: 0.74 mg/dL — ABNORMAL LOW (ref 0.76–1.27)
Globulin, Total: 1.9 g/dL (ref 1.5–4.5)
Glucose: 93 mg/dL (ref 70–99)
Potassium: 3.9 mmol/L (ref 3.5–5.2)
Sodium: 139 mmol/L (ref 134–144)
Total Protein: 6.2 g/dL (ref 6.0–8.5)
eGFR: 98 mL/min/{1.73_m2} (ref >59)

## 2023-10-05 LAB — MAGNESIUM: Magnesium: 2 mg/dL (ref 1.6–2.3)

## 2023-10-05 LAB — VITAMIN D 25 HYDROXY (VIT D DEFICIENCY, FRACTURES): Vit D, 25-Hydroxy: 10.7 ng/mL — ABNORMAL LOW (ref 30.0–100.0)

## 2023-10-05 MED ORDER — VITAMIN D (ERGOCALCIFEROL) 1.25 MG (50000 UNIT) PO CAPS: 50000.0000 [IU] | ORAL_CAPSULE | ORAL | 0 refills | Status: AC

## 2023-10-05 NOTE — Progress Notes (Signed)
 Low platelets. Ordered stat repeat for today. Please have patient come in this morning. Referral placed to hematology.

## 2023-10-05 NOTE — Addendum Note (Signed)
 Addended by: Neale Burly on: 10/05/2023 09:01 AM   Modules accepted: Orders

## 2023-10-05 NOTE — Addendum Note (Signed)
 Addended by: Neale Burly on: 10/05/2023 08:53 AM   Modules accepted: Orders

## 2023-10-05 NOTE — Progress Notes (Signed)
 Also has low vitamin D. I have sent in a weekly supplement to take for the next 12 weeks. After that, take a daily OTC vitamin D supplement with 1000-2000 IU.

## 2023-10-06 LAB — ANEMIA PROFILE B
Basophils Absolute: 0.1 10*3/uL (ref 0.0–0.2)
Basos: 1 %
EOS (ABSOLUTE): 0.1 10*3/uL (ref 0.0–0.4)
Eos: 2 %
Ferritin: 769 ng/mL — ABNORMAL HIGH (ref 30–400)
Folate: 9.5 ng/mL (ref >3.0)
Hematocrit: 49.1 % (ref 37.5–51.0)
Hemoglobin: 16.5 g/dL (ref 13.0–17.7)
Immature Grans (Abs): 0.1 10*3/uL (ref 0.0–0.1)
Immature Granulocytes: 1 %
Iron Saturation: 36 % (ref 15–55)
Iron: 80 ug/dL (ref 38–169)
Lymphocytes Absolute: 1.3 10*3/uL (ref 0.7–3.1)
Lymphs: 23 %
MCH: 30.7 pg (ref 26.6–33.0)
MCHC: 33.6 g/dL (ref 31.5–35.7)
MCV: 91 fL (ref 79–97)
Monocytes Absolute: 0.5 10*3/uL (ref 0.1–0.9)
Monocytes: 9 %
Neutrophils Absolute: 3.6 10*3/uL (ref 1.4–7.0)
Neutrophils: 64 %
Platelets: 97 10*3/uL — CL (ref 150–450)
RBC: 5.38 x10E6/uL (ref 4.14–5.80)
RDW: 13.7 % (ref 11.6–15.4)
Retic Ct Pct: 2.1 % (ref 0.6–2.6)
Total Iron Binding Capacity: 220 ug/dL — ABNORMAL LOW (ref 250–450)
UIBC: 140 ug/dL (ref 111–343)
Vitamin B-12: 279 pg/mL (ref 232–1245)
WBC: 5.6 10*3/uL (ref 3.4–10.8)

## 2023-10-08 ENCOUNTER — Ambulatory Visit: Payer: Self-pay | Admitting: Family Medicine

## 2023-10-08 ENCOUNTER — Encounter: Payer: Self-pay | Admitting: Family Medicine

## 2023-10-08 ENCOUNTER — Ambulatory Visit: Admitting: Family Medicine

## 2023-10-08 VITALS — BP 128/79 | HR 74 | Temp 97.5°F | Ht 67.0 in | Wt 250.0 lb

## 2023-10-08 DIAGNOSIS — I152 Hypertension secondary to endocrine disorders: Secondary | ICD-10-CM

## 2023-10-08 DIAGNOSIS — R42 Dizziness and giddiness: Secondary | ICD-10-CM | POA: Diagnosis not present

## 2023-10-08 DIAGNOSIS — E1159 Type 2 diabetes mellitus with other circulatory complications: Secondary | ICD-10-CM | POA: Diagnosis not present

## 2023-10-08 DIAGNOSIS — J0121 Acute recurrent ethmoidal sinusitis: Secondary | ICD-10-CM

## 2023-10-08 MED ORDER — MINOCYCLINE HCL 100 MG PO CAPS
100.0000 mg | ORAL_CAPSULE | Freq: Two times a day (BID) | ORAL | 0 refills | Status: AC
Start: 2023-10-08 — End: 2023-10-18

## 2023-10-08 MED ORDER — PREDNISONE 10 MG PO TABS: ORAL_TABLET | ORAL | 0 refills | Status: DC

## 2023-10-08 MED ORDER — BETAMETHASONE SOD PHOS & ACET 6 (3-3) MG/ML IJ SUSP
6.0000 mg | Freq: Once | INTRAMUSCULAR | Status: AC
  Administered 2023-10-08: 6 mg via INTRAMUSCULAR

## 2023-10-08 MED ORDER — CEFTRIAXONE SODIUM 1 G IJ SOLR
1.0000 g | Freq: Once | INTRAMUSCULAR | Status: AC
  Administered 2023-10-08: 1 g via INTRAMUSCULAR

## 2023-10-08 NOTE — Telephone Encounter (Signed)
 Copied from CRM [id number]. Topic: Clinical - Red Word Triage >> Oct 08, 2023  8:24 AM Steve Williams wrote: Red Word that prompted transfer to Nurse Triage: 200/100 this weekend/  210/180 Average 175/95  137/78 right now/but high all weekend  Chief Complaint: elevated bp; see above Symptoms: dizzy, elevated bp Frequency: constant Pertinent Negatives: Patient denies headache, numbness, tingling, weakness, blurred vision Disposition: [] ED /[] Urgent Care (no appt availability in office) / [x] Appointment(In office/virtual)/ []  [facility]/ [] Home Care/ [] Refused Recommended Disposition /[] [facility]/ []  Follow-up with PCP Additional Notes: was seen in office on Thursday and dx with vertigo.  Per protocol apt made for today; care advice given, denies questions; instructed to go to ER if becomes worse.   Reason for Disposition  Systolic BP  >= 180 OR Diastolic >= 110  Answer Assessment - Initial Assessment Questions 1. BLOOD PRESSURE: "What is the blood pressure?" "Did you take at least two measurements 5 minutes apart?"     yes 2. ONSET: "When did you take your blood pressure?"     This weekend and today 3. HOW: "How did you take your blood pressure?" (e.g., automatic home BP monitor, visiting nurse)     Automatic wrist cuff 4. HISTORY: "Do you have a history of high blood pressure?"     yes 5. MEDICINES: "Are you taking any medicines for blood pressure?" "Have you missed any doses recently?"     denies 6. OTHER SYMPTOMS: "Do you have any symptoms?" (e.g., blurred vision, chest pain, difficulty breathing, headache, weakness)     denies 7. PREGNANCY: "Is there any chance you are pregnant?" "When was your last menstrual period?"     na  Protocols used: Blood Pressure - High-A-AH

## 2023-10-08 NOTE — Telephone Encounter (Signed)
APPT MADE TODAY

## 2023-10-08 NOTE — Progress Notes (Addendum)
 Subjective:  Patient ID: Steve Williams, male    DOB: [birth year]  Age: 68 y.o. MRN: [id number]  CC: Dizziness (Dx with vertigo and was given meclizine. Since then BP has been running high. Decided Steve Williams might need to follow up.  Averaging 165/105 No headaches or any other symptoms/*compared his cuff to manual and his is running a lot higher)   HPI Steve Williams presents for feels like head is in a vice. Frontal Pressure. Nuisance, 1/10. Swimmy headed, but no vertigo. Unsteady when Steve Williams first stands. BP is up. Able to function normally overall.   Has father with Hemochromatosis, referral in place for Hematology eval.      10/04/2023   12:15 PM 06/05/2023   10:31 AM 05/09/2023    8:04 AM  Depression screen PHQ 2/9  Decreased Interest 0 0   Down, Depressed, Hopeless 0 0   PHQ - 2 Score 0 0   Altered sleeping 0 0 0  Tired, decreased energy 0 0 0  Change in appetite 0 0 0  Feeling bad or failure about yourself  0 0 0  Trouble concentrating 0 0 0  Moving slowly or fidgety/restless 0 0 0  Suicidal thoughts 0 0 0  PHQ-9 Score 0 0   Difficult doing work/chores Not difficult at all Not difficult at all Not difficult at all    History Steve Williams has a past medical history of Allergy, Anxiety, Arthritis, Asthma, Cancer (HCC), Depression, Diabetes mellitus without complication (HCC), Hyperlipidemia, and Hypertension.   Steve Williams has a past surgical history that includes Knee surgery (Left, 1974); Knee arthroscopy (Left); Carpal bone excision (Bilateral); Trigger finger release (Right); and lipoma removed.   His family history includes Cancer in his father; Diabetes in his father, mother, and sister; Heart disease in his father and mother; Hypertension in his mother and sister; Migraines in his son.Steve Williams reports that Steve Williams has never smoked. Steve Williams has never used smokeless tobacco. Steve Williams reports that Steve Williams does not drink alcohol and does not use drugs.    ROS Review of Systems  Constitutional: Negative.   HENT: Negative.     Eyes:  Negative for visual disturbance.  Respiratory:  Negative for cough and shortness of breath.   Cardiovascular:  Negative for chest pain and leg swelling.  Gastrointestinal:  Negative for abdominal pain, diarrhea, nausea and vomiting.  Genitourinary:  Negative for difficulty urinating.  Musculoskeletal:  Negative for arthralgias and myalgias.  Skin:  Negative for rash.  Neurological:  Positive for dizziness, light-headedness and headaches.  Psychiatric/Behavioral:  Negative for sleep disturbance.     Objective:  BP 128/79   Pulse 74   Temp (!) 97.5 F (36.4 C)   Ht 5\' 7"  (1.702 m)   Wt 250 lb (113.4 kg)   SpO2 95%   BMI 39.16 kg/m   BP Readings from Last 3 Encounters:  10/08/23 128/79  10/04/23 (!) 155/93  09/05/23 (!) 104/55    Wt Readings from Last 3 Encounters:  10/08/23 250 lb (113.4 kg)  10/04/23 255 lb (115.7 kg)  09/05/23 252 lb (114.3 kg)     Physical Exam Constitutional:      General: Steve Williams is not in acute distress.    Appearance: Steve Williams is well-developed.  HENT:     Head: Normocephalic and atraumatic.     Right Ear: External ear normal.     Left Ear: External ear normal.     Nose: Nose normal.  Eyes:     Conjunctiva/sclera: Conjunctivae normal.  Pupils: Pupils are equal, round, and reactive to light.  Cardiovascular:     Rate and Rhythm: Normal rate and regular rhythm.     Heart sounds: Normal heart sounds. No murmur heard. Pulmonary:     Effort: Pulmonary effort is normal. No respiratory distress.     Breath sounds: Normal breath sounds. No wheezing or rales.  Abdominal:     Palpations: Abdomen is soft.     Tenderness: There is no abdominal tenderness.  Musculoskeletal:        General: Normal range of motion.     Cervical back: Normal range of motion and neck supple.  Skin:    General: Skin is warm and dry.  Neurological:     Mental Status: Steve Williams is alert and oriented to person, place, and time.     Cranial Nerves: No cranial nerve deficit.      Motor: No weakness.     Coordination: Coordination normal.     Gait: Gait normal.     Deep Tendon Reflexes: Reflexes are normal and symmetric.  Psychiatric:        Behavior: Behavior normal.        Thought Content: Thought content normal.        Judgment: Judgment normal.       Assessment & Plan:   Steve Williams" was seen today for dizziness.  Diagnoses and all orders for this visit:  Dizziness -     betamethasone acetate-betamethasone sodium phosphate (CELESTONE) injection 6 mg -     cefTRIAXone (ROCEPHIN) injection 1 g  Acute recurrent ethmoidal sinusitis -     betamethasone acetate-betamethasone sodium phosphate (CELESTONE) injection 6 mg -     cefTRIAXone (ROCEPHIN) injection 1 g  Hypertension associated with diabetes (HCC)  Other orders -     minocycline (MINOCIN) 100 MG capsule; Take 1 capsule (100 mg total) by mouth 2 (two) times daily for 10 days. Take on an empty stomach -     predniSONE (DELTASONE) 10 MG tablet; Take 5 daily for 2 days followed by 4,3,2 and 1 for 2 days each.       I am having Steve Williams "Steve Williams" start on minocycline and predniSONE. I am also having him maintain his aspirin, fluticasone, loratadine, BD Disp Needles, sertraline, montelukast, meloxicam, losartan, hydrochlorothiazide, simvastatin, B-D 3CC LUER-LOK SYR 22GX1", Syringe/Needle (Disp), testosterone cypionate, acetaminophen, sildenafil, meclizine, and Vitamin D (Ergocalciferol). We administered betamethasone acetate-betamethasone sodium phosphate and cefTRIAXone.  Allergies as of 10/08/2023       Reactions   Lisinopril    Erythromycin Rash        Medication List        Accurate as of October 08, 2023  5:22 PM. If you have any questions, ask your nurse or doctor.          acetaminophen 500 MG tablet Commonly known as: TYLENOL Take 1,000 mg by mouth every 6 (six) hours as needed for mild pain (pain score 1-3).   aspirin 81 MG tablet Take 81 mg by mouth daily.   B-D 3CC  LUER-LOK SYR 22GX1" 22G X 1" 3 ML Misc Generic drug: SYRINGE-NEEDLE (DISP) 3 ML USE TO GIVE TESTOSTERONE   Syringe/Needle (Disp) 18G X 1" 3 ML Misc Use as directed Use to draw up testosterone   BD Disp Needles 18G X 1-1/2" Misc Generic drug: NEEDLE (DISP) 18 G USE TO draw UP testosterone   fluticasone 50 MCG/ACT nasal spray Commonly known as: FLONASE Place 1 spray into both nostrils daily.  hydrochlorothiazide 25 MG tablet Commonly known as: HYDRODIURIL Take 1 tablet (25 mg total) by mouth daily.   loratadine 10 MG tablet Commonly known as: CLARITIN Take 10 mg by mouth daily.   losartan 100 MG tablet Commonly known as: COZAAR Take 1 tablet (100 mg total) by mouth daily.   meclizine 25 MG tablet Commonly known as: ANTIVERT Take 0.5-1 tablets (12.5-25 mg total) by mouth 3 (three) times daily as needed for dizziness.   meloxicam 15 MG tablet Commonly known as: MOBIC Take 1 tablet (15 mg total) by mouth daily.   minocycline 100 MG capsule Commonly known as: Minocin Take 1 capsule (100 mg total) by mouth 2 (two) times daily for 10 days. Take on an empty stomach Started by: Steve Williams   montelukast 10 MG tablet Commonly known as: SINGULAIR Take 1 tablet (10 mg total) by mouth at bedtime.   predniSONE 10 MG tablet Commonly known as: DELTASONE Take 5 daily for 2 days followed by 4,3,2 and 1 for 2 days each. Started by: Josy Peaden   sertraline 100 MG tablet Commonly known as: ZOLOFT Take 1 tablet (100 mg total) by mouth daily.   sildenafil 20 MG tablet Commonly known as: REVATIO Take 20 mg by mouth as needed.   simvastatin 40 MG tablet Commonly known as: ZOCOR Take 1 tablet (40 mg total) by mouth daily.   testosterone cypionate 100 MG/ML injection Commonly known as: DEPOTESTOTERONE CYPIONATE INJECT 1 ML INTRAMUSCULARLY EVERY 7 DAYS   Vitamin D (Ergocalciferol) 1.25 MG (50000 UNIT) Caps capsule Commonly known as: DRISDOL Take 1 capsule (50,000 Units  total) by mouth every 7 (seven) days for 12 doses.         Follow-up: Return if symptoms worsen or fail to improve.  Mechele Claude, M.D.

## 2023-10-09 ENCOUNTER — Encounter: Payer: Self-pay | Admitting: Family Medicine

## 2023-10-09 NOTE — Progress Notes (Signed)
 Patient has low PLT. I have referred him to hematology/oncology. Patient is at risk for bleeding.

## 2023-10-18 ENCOUNTER — Inpatient Hospital Stay: Attending: Oncology | Admitting: Oncology

## 2023-10-18 ENCOUNTER — Other Ambulatory Visit: Payer: Self-pay

## 2023-10-18 ENCOUNTER — Inpatient Hospital Stay

## 2023-10-18 VITALS — BP 130/81 | HR 92 | Temp 98.2°F | Resp 18 | Ht 67.0 in | Wt 250.5 lb

## 2023-10-18 DIAGNOSIS — R7989 Other specified abnormal findings of blood chemistry: Secondary | ICD-10-CM

## 2023-10-18 DIAGNOSIS — E119 Type 2 diabetes mellitus without complications: Secondary | ICD-10-CM | POA: Diagnosis not present

## 2023-10-18 DIAGNOSIS — M19012 Primary osteoarthritis, left shoulder: Secondary | ICD-10-CM | POA: Diagnosis not present

## 2023-10-18 DIAGNOSIS — D696 Thrombocytopenia, unspecified: Secondary | ICD-10-CM

## 2023-10-18 DIAGNOSIS — M19011 Primary osteoarthritis, right shoulder: Secondary | ICD-10-CM | POA: Diagnosis not present

## 2023-10-18 DIAGNOSIS — M170 Bilateral primary osteoarthritis of knee: Secondary | ICD-10-CM | POA: Diagnosis not present

## 2023-10-18 LAB — HEPATITIS B SURFACE ANTIBODY,QUALITATIVE: Hep B S Ab: REACTIVE — AB

## 2023-10-18 LAB — HEPATITIS PANEL, ACUTE
HCV Ab: NONREACTIVE
Hep A IgM: NONREACTIVE
Hep B C IgM: NONREACTIVE
Hepatitis B Surface Ag: NONREACTIVE

## 2023-10-18 LAB — HEPATITIS B SURFACE ANTIGEN: Hepatitis B Surface Ag: NONREACTIVE

## 2023-10-18 NOTE — Assessment & Plan Note (Signed)
 Patient has a family history of hemochromatosis in father.  He was never tested for hemochromatosis.  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  Liver enzymes normal.  Ferritin level in 700s. -Will send for hemochromatosis gene testing today.  Return to clinic in 2 weeks to discuss results

## 2023-10-18 NOTE — Patient Instructions (Signed)
 VISIT SUMMARY:  You came in today to discuss your long-standing low platelet count and other health concerns. We reviewed your history of low platelets, potential hemochromatosis, well-controlled diabetes, and osteoarthritis. We have planned several tests to further investigate these issues and will follow up with you in two weeks to discuss the results.  YOUR PLAN:  -THROMBOCYTOPENIA: Thrombocytopenia means having a low platelet count, which can affect blood clotting. Your platelet count has been stable and without complications for over 30 years. We will do a liver ultrasound to check for any abnormalities in your liver or spleen.  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 Given your family history and high ferritin levels, we will do genetic testing and a liver ultrasound to check for iron deposition and liver health. We will also test for hepatitis to rule out other causes of liver issues.  INSTRUCTIONS:  Please schedule a follow-up appointment in two weeks to discuss the results of your tests and further management.

## 2023-10-18 NOTE — Assessment & Plan Note (Signed)
 Patient has a chronic history of thrombocytopenia lasting at least 30 years.  The counts are stable.  No increased episodes of bleeding or petechiae.  Some platelet clumping reported in CBC. -Will obtain hepatitis panel. -Will obtain ultrasound of abdomen to rule out hepatosplenomegaly  Return to clinic after ultrasound to discuss results.

## 2023-10-18 NOTE — Progress Notes (Signed)
 [facility] at [facility]  HEMATOLOGY NEW VISIT  Dettinger, Elige Radon, MD  REASON FOR REFERRAL: Thrombocytopenia   HISTORY OF PRESENT ILLNESS: Steve Williams 68 y.o. male referred for thrombocytopenia. Patient stated that he has known that he had low platelets for atleast 30 years and never had problems from it. The patient reports that the low platelet count has been monitored but not investigated in the past.  Patient reported that his father had a history of hemochromatosis, liver cirrhosis and had a splenectomy for the same.  He had never had hemochromatosis testing done.  Patient also has a history of hypogonadism and has been taking testosterone shots for the past 10 to 15 years.  Patient reports no increased episodes of bleeding, rash, petechiae.  Is of very good health overall.  He has no other complaints today including abdominal pain and hemoptysis.  The patient also has a history of diabetes, which has been well-controlled with metformin and weight loss. In addition, the patient has osteoarthritis, particularly in the knees and shoulders.  Patient does not smoke does only drink alcohol occasionally.  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  I have reviewed the past medical history, past surgical history, social history and family history with the patient   ALLERGIES:  is allergic to lisinopril and erythromycin.  MEDICATIONS:  Current Outpatient Medications  Medication Sig Dispense Refill   acetaminophen (TYLENOL) 500 MG tablet Take 1,000 mg by mouth every 6 (six) hours as needed for mild pain (pain score 1-3).     aspirin 81 MG tablet Take 81 mg by mouth daily.     B-D 3CC LUER-LOK SYR 22GX1" 22G X 1" 3 ML MISC USE TO GIVE TESTOSTERONE 10 each 6   BD DISP NEEDLES 18G X 1-1/2" MISC USE TO draw UP testosterone 20 each 6   fluticasone (FLONASE) 50 MCG/ACT nasal spray Place 1 spray into  both nostrils daily.     hydrochlorothiazide (HYDRODIURIL) 25 MG tablet Take 1 tablet (25 mg total) by mouth daily. 90 tablet 3   loratadine (CLARITIN) 10 MG tablet Take 10 mg by mouth daily.     losartan (COZAAR) 100 MG tablet Take 1 tablet (100 mg total) by mouth daily. 90 tablet 3   meclizine (ANTIVERT) 25 MG tablet Take 0.5-1 tablets (12.5-25 mg total) by mouth 3 (three) times daily as needed for dizziness. 30 tablet 0   meloxicam (MOBIC) 15 MG tablet Take 1 tablet (15 mg total) by mouth daily. 90 tablet 3   minocycline (MINOCIN) 100 MG capsule Take 1 capsule (100 mg total) by mouth 2 (two) times daily for 10 days. Take on an empty stomach 20 capsule 0   montelukast (SINGULAIR) 10 MG tablet Take 1 tablet (10 mg total) by mouth at bedtime. 90 tablet 3   predniSONE (DELTASONE) 10 MG tablet Take 5 daily for 2 days followed by 4,3,2 and 1 for 2 days each. 30 tablet 0   sertraline (ZOLOFT) 100 MG tablet Take 1 tablet (100 mg total) by mouth daily. 90 tablet 3   sildenafil (REVATIO) 20 MG tablet Take 20 mg by mouth as needed.     simvastatin (ZOCOR) 40 MG tablet Take 1 tablet (40 mg total) by mouth daily. 90 tablet 3   Syringe/Needle, Disp, 18G X 1" 3 ML MISC Use as directed Use to draw up testosterone 10 each 6   testosterone  cypionate (DEPOTESTOTERONE CYPIONATE) 100 MG/ML injection INJECT 1 ML INTRAMUSCULARLY EVERY 7 DAYS 10 mL 1   Vitamin D, Ergocalciferol, (DRISDOL) 1.25 MG (50000 UNIT) CAPS capsule Take 1 capsule (50,000 Units total) by mouth every 7 (seven) days for 12 doses. 12 capsule 0   No current facility-administered medications for this visit.     REVIEW OF SYSTEMS:   Constitutional: Denies fevers, chills or night sweats Eyes: Denies blurriness of vision Ears, nose, mouth, throat, and face: Denies mucositis or sore throat Respiratory: Denies cough, dyspnea or wheezes Cardiovascular: Denies palpitation, chest discomfort or lower extremity swelling Gastrointestinal:  Denies nausea,  heartburn or change in bowel habits Skin: Denies abnormal skin rashes Lymphatics: Denies new lymphadenopathy or easy bruising Neurological:Denies numbness, tingling or new weaknesses Behavioral/Psych: Mood is stable, no new changes  All other systems were reviewed with the patient and are negative.  PHYSICAL EXAMINATION:   Vitals:   10/18/23 1446  BP: 130/81  Pulse: 92  Resp: 18  Temp: 98.2 F (36.8 C)  SpO2: 94%    GENERAL:alert, no distress and comfortable LUNGS: clear to auscultation and percussion with normal breathing effort HEART: regular rate & rhythm and no murmurs and no lower extremity edema ABDOMEN:abdomen soft, non-tender and normal bowel sounds, slight hepatomegaly palpated but abdominal exam limited by obesity. Musculoskeletal:no cyanosis of digits and no clubbing  NEURO: alert & oriented x 3 with fluent speech  LABORATORY DATA:  I have reviewed the data as listed  Lab Results  Component Value Date   WBC 5.6 10/05/2023   NEUTROABS 3.6 10/05/2023   HGB 16.5 10/05/2023   HCT 49.1 10/05/2023   MCV 91 10/05/2023   PLT 97 (LL) 10/05/2023    Latest Reference Range & Units 09/28/15 13:56 12/28/15 10:04 08/02/16 10:07 08/20/17 09:00 02/27/18 08:43 08/08/19 12:13 11/06/19 12:51 02/05/20 09:02 11/01/20 08:31 05/04/21 08:02 11/02/21 09:14 05/04/22 08:06 11/03/22 08:03 05/09/23 08:09 07/25/23 12:09 07/26/23 04:37 10/04/23 12:35 10/05/23 12:06  Platelets 150 - 450 x10E3/uL 101 (L) 102 (L) 100 (LL) 103 (L) 122 (L) 117 (L) 130 (L) 104 (L) 127 (L) 116 (L) 114 (L) 112 (L) 105 (L) 98 (LL) 108 (L) 103 (L) 96 (LL) 97 (LL)  (LL): Data is critically low (L): Data is abnormally low    Chemistry      Component Value Date/Time   NA 139 10/04/2023 1235   K 3.9 10/04/2023 1235   CL 100 10/04/2023 1235   CO2 25 10/04/2023 1235   BUN 12 10/04/2023 1235   CREATININE 0.74 (L) 10/04/2023 1235      Component Value Date/Time   CALCIUM 8.9 10/04/2023 1235   ALKPHOS 83 10/04/2023  1235   AST 17 10/04/2023 1235   ALT 23 10/04/2023 1235   BILITOT 0.6 10/04/2023 1235      Latest Reference Range & Units 10/05/23 12:06  Iron 38 - 169 ug/dL 80  UIBC 578 - 469 ug/dL 629  TIBC 528 - 413 ug/dL 244 (L)  Ferritin 30 - 400 ng/mL 769 (H)  Iron Saturation 15 - 55 % 36  Folate >3.0 ng/mL 9.5  Vitamin B12 232 - 1,245 pg/mL 279  (L): Data is abnormally low (H): Data is abnormally high   ASSESSMENT & PLAN:  Patient is a 68 year old male referred for thrombocytopenia   Thrombocytopenia (HCC) Patient has a chronic history of thrombocytopenia lasting at least 30 years.  The counts are stable.  No increased episodes of bleeding or petechiae.  Some platelet clumping reported in CBC. -  Will obtain hepatitis panel. -Will obtain ultrasound of abdomen to rule out hepatosplenomegaly  Return to clinic after ultrasound to discuss results.  Elevated ferritin Patient has a family history of hemochromatosis in father.  He was never tested for hemochromatosis.  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  Liver enzymes normal.  Ferritin level in 700s. -Will send for hemochromatosis gene testing today.  Return to clinic in 2 weeks to discuss results   Orders Placed This Encounter  Procedures   US Abdomen Complete    Standing Status:   Future    Expected Date:   10/18/2023    Expiration Date:   10/17/2024    Reason for Exam (SYMPTOM  OR DIAGNOSIS REQUIRED):   Rule out hepatosplenomegaly    Preferred imaging location?:   [facility]   HFE-Associated Hereditary Hemochromatosis (COHESION)    Standing Status:   Future    Number of Occurrences:   1    Expected Date:   10/18/2023    Expiration Date:   10/17/2024   Hepatitis panel, acute    Standing Status:   Future    Number of Occurrences:   1    Expected Date:   10/18/2023    Expiration Date:   10/17/2024    The total time spent in the appointment was 40 minutes encounter with patients including review of chart and  various tests results, discussions about plan of care and coordination of care plan   All questions were answered. The patient knows to call the clinic with any problems, questions or concerns. No barriers to learning was detected.   Cindie Crumbly, MD 3/20/20253:22 PM

## 2023-10-19 ENCOUNTER — Other Ambulatory Visit: Payer: Self-pay

## 2023-10-19 LAB — MISC LABCORP TEST (SEND OUT): Labcorp test code: 511345

## 2023-10-19 LAB — HEPATITIS B CORE ANTIBODY, TOTAL: HEP B CORE AB: NEGATIVE

## 2023-10-23 LAB — HEMOCHROMATOSIS DNA-PCR(C282Y,H63D)

## 2023-10-23 LAB — MISC LABCORP TEST (SEND OUT): Labcorp test code: 550100

## 2023-10-26 ENCOUNTER — Ambulatory Visit (HOSPITAL_COMMUNITY)
Admission: RE | Admit: 2023-10-26 | Discharge: 2023-10-26 | Disposition: A | Discharge status: Home / Self Care | Source: Ambulatory Visit | Attending: Oncology | Admitting: Oncology

## 2023-10-26 DIAGNOSIS — R7989 Other specified abnormal findings of blood chemistry: Secondary | ICD-10-CM | POA: Insufficient documentation

## 2023-10-26 DIAGNOSIS — N281 Cyst of kidney, acquired: Secondary | ICD-10-CM | POA: Diagnosis not present

## 2023-10-26 DIAGNOSIS — K8689 Other specified diseases of pancreas: Secondary | ICD-10-CM | POA: Diagnosis not present

## 2023-10-26 DIAGNOSIS — D696 Thrombocytopenia, unspecified: Secondary | ICD-10-CM | POA: Diagnosis not present

## 2023-10-26 DIAGNOSIS — K8020 Calculus of gallbladder without cholecystitis without obstruction: Secondary | ICD-10-CM | POA: Diagnosis not present

## 2023-11-01 ENCOUNTER — Ambulatory Visit: Admitting: Oncology

## 2023-11-01 ENCOUNTER — Inpatient Hospital Stay: Attending: Oncology | Admitting: Oncology

## 2023-11-01 VITALS — BP 138/80 | HR 72 | Temp 98.1°F | Resp 18 | Ht 67.0 in | Wt 253.0 lb

## 2023-11-01 DIAGNOSIS — M1990 Unspecified osteoarthritis, unspecified site: Secondary | ICD-10-CM | POA: Insufficient documentation

## 2023-11-01 DIAGNOSIS — Z7952 Long term (current) use of systemic steroids: Secondary | ICD-10-CM | POA: Insufficient documentation

## 2023-11-01 DIAGNOSIS — E291 Testicular hypofunction: Secondary | ICD-10-CM | POA: Insufficient documentation

## 2023-11-01 DIAGNOSIS — R7989 Other specified abnormal findings of blood chemistry: Secondary | ICD-10-CM | POA: Insufficient documentation

## 2023-11-01 DIAGNOSIS — D696 Thrombocytopenia, unspecified: Secondary | ICD-10-CM | POA: Insufficient documentation

## 2023-11-01 DIAGNOSIS — Z79899 Other long term (current) drug therapy: Secondary | ICD-10-CM | POA: Insufficient documentation

## 2023-11-01 DIAGNOSIS — Z7982 Long term (current) use of aspirin: Secondary | ICD-10-CM | POA: Insufficient documentation

## 2023-11-01 DIAGNOSIS — K869 Disease of pancreas, unspecified: Secondary | ICD-10-CM | POA: Insufficient documentation

## 2023-11-01 NOTE — Progress Notes (Signed)
 [facility] at [facility]  HEMATOLOGY FOLLOW-UP VISIT  Dettinger, Elige Radon, MD  REASON FOR FOLLOW-UP: Thrombocytopenia Hemochromatosis  ASSESSMENT & PLAN:  Patient is a 68 y.o. male following for chronic thrombocytopenia and a family history of hemochromatosis.    Thrombocytopenia (HCC) Patient has a chronic history of thrombocytopenia lasting at least 30 years.  The counts are stable.  No increased episodes of bleeding or petechiae.  Some platelet clumping reported in CBC.  Negative hepatitis panel.  Ultrasound of abdomen showed no hepatosplenomegaly. -Continue to monitor counts for now.   -No further workup needed -If platelets continue to trend down, will obtain more labs  Return to clinic in 3 months  Elevated ferritin Patient has a family history of hemochromatosis in father.  He was never tested for hemochromatosis.  Patient also reports hypogonadism and osteoarthritis.  Liver enzymes normal.  Ferritin level in 700s. -HFE gene positive for H63D: Heterozygous -About test result can explain elevated ferritin levels but should not cause any other complications of hemochromatosis. -Ferritin level stable at this time.  Return to clinic in 3 months with labs  Pancreatic lesion Hypoechoic mass observed on ultrasound in the pancreas.  Recommended to obtain CT scan -Ordered CT scan of abdomen and pelvis with contrast  Return to clinic to review results.   Orders Placed This Encounter  Procedures   CT ABDOMEN PELVIS W CONTRAST    Standing Status:   Future    Expected Date:   11/02/2023    Expiration Date:   10/31/2024    If indicated for the ordered procedure, I authorize the administration of contrast media per Radiology protocol:   Yes    Does the patient have a contrast media/X-ray dye allergy?:   No    Preferred imaging location?:   [facility]    If indicated for the ordered procedure, I authorize the administration of oral contrast media per  Radiology protocol:   Yes   CBC with Differential/Platelet    Standing Status:   Future    Expected Date:   01/29/2024    Expiration Date:   10/31/2024   Comprehensive metabolic panel with GFR    Standing Status:   Future    Expected Date:   01/29/2024    Expiration Date:   10/31/2024   Ferritin    Standing Status:   Future    Expected Date:   01/29/2024    Expiration Date:   10/31/2024   Iron and TIBC    Standing Status:   Future    Expected Date:   01/29/2024    Expiration Date:   10/31/2024    The total time spent in the appointment was 20 minutes encounter with patients including review of chart and various tests results, discussions about plan of care and coordination of care plan   All questions were answered. The patient knows to call the clinic with any problems, questions or concerns. No barriers to learning was detected.  Cindie Crumbly, MD 4/3/202512:25 PM   SUMMARY OF HEMATOLOGIC HISTORY: Chronic thrombocytopenia:  -Hepatitis panel negative -Ultrasound did not show hepatosplenomegaly -Platelet count has been stable for 30 years   2.  Family history of hemochromatosis:  -HFE gene positive for H63D: Heterozygous   INTERVAL HISTORY: Steve Williams 68 y.o. male following for thrombocytopenia and family history of hemochromatosis. He has no complaints today.  Denies abdominal pain, nausea, vomiting, night sweats, weight loss.  Overall has been doing really well.  He does not report any increased episodes of bleeding.    I have reviewed the past medical history, past surgical history, social history and family history with the patient   ALLERGIES:  is allergic to lisinopril and erythromycin.  MEDICATIONS:  Current Outpatient Medications  Medication Sig Dispense Refill   acetaminophen (TYLENOL) 500 MG tablet Take 1,000 mg by mouth every 6 (six) hours as needed for mild pain (pain score 1-3).     aspirin 81 MG tablet Take 81 mg by mouth daily.     B-D 3CC LUER-LOK SYR 22GX1" 22G  X 1" 3 ML MISC USE TO GIVE TESTOSTERONE 10 each 6   BD DISP NEEDLES 18G X 1-1/2" MISC USE TO draw UP testosterone 20 each 6   fluticasone (FLONASE) 50 MCG/ACT nasal spray Place 1 spray into both nostrils daily.     hydrochlorothiazide (HYDRODIURIL) 25 MG tablet Take 1 tablet (25 mg total) by mouth daily. 90 tablet 3   loratadine (CLARITIN) 10 MG tablet Take 10 mg by mouth daily.     losartan (COZAAR) 100 MG tablet Take 1 tablet (100 mg total) by mouth daily. 90 tablet 3   meclizine (ANTIVERT) 25 MG tablet Take 0.5-1 tablets (12.5-25 mg total) by mouth 3 (three) times daily as needed for dizziness. 30 tablet 0   meloxicam (MOBIC) 15 MG tablet Take 1 tablet (15 mg total) by mouth daily. 90 tablet 3   montelukast (SINGULAIR) 10 MG tablet Take 1 tablet (10 mg total) by mouth at bedtime. 90 tablet 3   predniSONE (DELTASONE) 10 MG tablet Take 5 daily for 2 days followed by 4,3,2 and 1 for 2 days each. 30 tablet 0   sertraline (ZOLOFT) 100 MG tablet Take 1 tablet (100 mg total) by mouth daily. 90 tablet 3   sildenafil (REVATIO) 20 MG tablet Take 20 mg by mouth as needed.     simvastatin (ZOCOR) 40 MG tablet Take 1 tablet (40 mg total) by mouth daily. 90 tablet 3   Syringe/Needle, Disp, 18G X 1" 3 ML MISC Use as directed Use to draw up testosterone 10 each 6   testosterone cypionate (DEPOTESTOTERONE CYPIONATE) 100 MG/ML injection INJECT 1 ML INTRAMUSCULARLY EVERY 7 DAYS 10 mL 1   Vitamin D, Ergocalciferol, (DRISDOL) 1.25 MG (50000 UNIT) CAPS capsule Take 1 capsule (50,000 Units total) by mouth every 7 (seven) days for 12 doses. 12 capsule 0   No current facility-administered medications for this visit.     REVIEW OF SYSTEMS:   Constitutional: Denies fevers, chills or night sweats Eyes: Denies blurriness of vision Ears, nose, mouth, throat, and face: Denies mucositis or sore throat Respiratory: Denies cough, dyspnea or wheezes Cardiovascular: Denies palpitation, chest discomfort or lower extremity  swelling Gastrointestinal:  Denies nausea, heartburn or change in bowel habits Skin: Denies abnormal skin rashes Lymphatics: Denies new lymphadenopathy or easy bruising Neurological:Denies numbness, tingling or new weaknesses Behavioral/Psych: Mood is stable, no new changes  All other systems were reviewed with the patient and are negative.  PHYSICAL EXAMINATION:   Vitals:   11/01/23 0904 11/01/23 0909  BP: (!) 161/89 138/80  Pulse: 72   Resp: 18   Temp: 98.1 F (36.7 C)   SpO2: 98%     GENERAL:alert, no distress and comfortable SKIN: skin color, texture, turgor are normal, no rashes or some erythematous scaly skin lesions on the chest. LYMPH:  no palpable lymphadenopathy in the cervical, axillary or inguinal LUNGS: clear to auscultation and percussion with normal breathing effort  HEART: regular rate & rhythm and no murmurs and no lower extremity edema ABDOMEN:abdomen soft, non-tender and normal bowel sounds Musculoskeletal:no cyanosis of digits and no clubbing  NEURO: alert & oriented x 3 with fluent speech  LABORATORY DATA:  I have reviewed the data as listed  Lab Results  Component Value Date   WBC 5.6 10/05/2023   NEUTROABS 3.6 10/05/2023   HGB 16.5 10/05/2023   HCT 49.1 10/05/2023   MCV 91 10/05/2023   PLT 97 (LL) 10/05/2023      Chemistry      Component Value Date/Time   NA 139 10/04/2023 1235   K 3.9 10/04/2023 1235   CL 100 10/04/2023 1235   CO2 25 10/04/2023 1235   BUN 12 10/04/2023 1235   CREATININE 0.74 (L) 10/04/2023 1235      Component Value Date/Time   CALCIUM 8.9 10/04/2023 1235   ALKPHOS 83 10/04/2023 1235   AST 17 10/04/2023 1235   ALT 23 10/04/2023 1235   BILITOT 0.6 10/04/2023 1235      Latest Reference Range & Units 10/18/23 15:38 10/18/23 15:43  Hep A Ab, IgM NON REACTIVE  NON REACTIVE   Hepatitis B Surface Ag NON REACTIVE  NON REACTIVE NON REACTIVE  Hep B S Ab NON REACTIVE  Reactive !   Hep B Core Ab, IgM NON REACTIVE  NON  REACTIVE   HEP B CORE AB Negative  Negative   HCV Ab NON REACTIVE  NON REACTIVE   !: Data is abnormal  HFE gene analysis:  Results:  c.845G>A (p.Cys282Tyr) - Not Detected  c.187C>G (p.His63Asp) - Detected, heterozygous  c.193A>T (p.Ser65Cys) - Not Detected    Latest Reference Range & Units 10/05/23 12:06  Iron 38 - 169 ug/dL 80  UIBC 621 - 308 ug/dL 657  TIBC 846 - 962 ug/dL 952 (L)  Ferritin 30 - 400 ng/mL 769 (H)  Iron Saturation 15 - 55 % 36  Folate >3.0 ng/mL 9.5  Vitamin B12 232 - 1,245 pg/mL 279  (L): Data is abnormally low (H): Data is abnormally high  RADIOGRAPHIC STUDIES: I have personally reviewed the radiological images as listed and agreed with the findings in the report.  US Abdomen Complete CLINICAL DATA:  Thrombocytopenia.  EXAM: ABDOMEN ULTRASOUND COMPLETE  COMPARISON:  None Available.  FINDINGS: Gallbladder: Gallstones are noted, largest measures 9 mm. No wall thickening visualized. No sonographic Murphy sign noted by sonographer.  Common bile duct: Diameter: 5 mm.  Liver: No focal lesion. Increased echotexture. Portal vein is patent on color Doppler imaging with normal direction of blood flow towards the liver.  IVC: No abnormality visualized.  Pancreas: There is a hypoechoic mass measuring 1.3 x 3.4 cm either in the pancreas or peripancreatic in location.  Spleen: Spleen is normal in echotexture measures 13.4 cm with volume of 706.55.  Right Kidney: Length: 13.5 cm. Echogenicity within normal limits. No hydronephrosis visualized. 1.4 x 1 x 1.1 cm simple cyst in the right kidney. No follow-up is recommended.  Left Kidney: Length: 13.6 cm. Echogenicity within normal limits. No mass or hydronephrosis visualized.  Abdominal aorta: No aneurysm visualized.  Other findings: None.  IMPRESSION: 1. Cholelithiasis without sonographic evidence of acute cholecystitis. 2. Fatty infiltration of the liver. 3. Hypoechoic mass measuring 1.3 x 3.4  cm either in the pancreas or peripancreatic in location. Recommend further evaluation with CT of the abdomen with contrast.  Electronically Signed   By: Sherian Rein M.D.   On: 10/26/2023 08:22

## 2023-11-01 NOTE — Assessment & Plan Note (Signed)
 Hypoechoic mass observed on ultrasound in the pancreas.  Recommended to obtain CT scan -Ordered CT scan of abdomen and pelvis with contrast  Return to clinic to review results.

## 2023-11-01 NOTE — Assessment & Plan Note (Signed)
 Patient has a family history of hemochromatosis in father.  He was never tested for hemochromatosis.  Patient also reports hypogonadism and osteoarthritis.  Liver enzymes normal.  Ferritin level in 700s. -HFE gene positive for H63D: Heterozygous -About test result can explain elevated ferritin levels but should not cause any other complications of hemochromatosis. -Ferritin level stable at this time.  Return to clinic in 3 months with labs

## 2023-11-01 NOTE — Patient Instructions (Signed)
 VISIT SUMMARY:  You came in today for a follow-up regarding hemochromatosis testing, low platelets, and liver health. 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 We also reviewed your chronic low platelet count, liver ultrasound results, and a newly discovered spot on your pancreas. Additionally, we talked about your gallstone and a fungal skin infection.  YOUR PLAN:  -THROMBOCYTOPENIA: Thrombocytopenia means having a low platelet count. Your platelet count has been low for thirty years without causing any significant issues. We will continue to monitor your platelet levels and consider testing for copper deficiency if your platelet levels drop significantly. Please schedule a follow-up in three months to reassess your platelet counts.  -PANCREATIC LESION: A pancreatic lesion is an abnormal spot on the pancreas. We found a small lesion on your pancreas during the ultrasound, which is likely a benign cyst. We have ordered a CT scan with contrast to further evaluate this lesion. We will review the CT scan results and follow up if there are any significant findings.  -HEMOCHROMATOSIS GENE CARRIER: Being a carrier of one hemochromatosis gene means you have one gene associated with the disease but do not have the disease itself. There is a low risk of passing this gene to your children unless your partner also carries the gene. No specific treatment is required for this.  INSTRUCTIONS:  Please schedule a follow-up appointment in three months to monitor your conditions and review the CT scan findings. Contact us sooner if the CT scan shows significant findings.

## 2023-11-01 NOTE — Assessment & Plan Note (Signed)
 Patient has a chronic history of thrombocytopenia lasting at least 30 years.  The counts are stable.  No increased episodes of bleeding or petechiae.  Some platelet clumping reported in CBC.  Negative hepatitis panel.  Ultrasound of abdomen showed no hepatosplenomegaly. -Continue to monitor counts for now.   -No further workup needed -If platelets continue to trend down, will obtain more labs  Return to clinic in 3 months

## 2023-11-07 ENCOUNTER — Encounter: Payer: Self-pay | Admitting: Family Medicine

## 2023-11-07 ENCOUNTER — Ambulatory Visit: Payer: Medicare Other | Admitting: Family Medicine

## 2023-11-07 VITALS — BP 131/84 | HR 73 | Ht 67.0 in | Wt 252.0 lb

## 2023-11-07 DIAGNOSIS — I152 Hypertension secondary to endocrine disorders: Secondary | ICD-10-CM | POA: Diagnosis not present

## 2023-11-07 DIAGNOSIS — E1169 Type 2 diabetes mellitus with other specified complication: Secondary | ICD-10-CM | POA: Diagnosis not present

## 2023-11-07 DIAGNOSIS — E785 Hyperlipidemia, unspecified: Secondary | ICD-10-CM

## 2023-11-07 DIAGNOSIS — E1159 Type 2 diabetes mellitus with other circulatory complications: Secondary | ICD-10-CM

## 2023-11-07 LAB — BAYER DCA HB A1C WAIVED: HB A1C (BAYER DCA - WAIVED): 7.1 % — ABNORMAL HIGH (ref 4.8–5.6)

## 2023-11-07 NOTE — Progress Notes (Signed)
 BP 131/84   Pulse 73   Ht 5\' 7"  (1.702 m)   Wt 252 lb (114.3 kg)   SpO2 95%   BMI 39.47 kg/m    Subjective:   Patient ID: Steve Williams, male    DOB: 1955-12-06, 68 y.o.   MRN: [id number]  HPI: Steve Williams is a 68 y.o. male presenting on 11/07/2023 for Medical Management of Chronic Issues, Diabetes, and Hyperlipidemia   HPI Type 2 diabetes mellitus Patient comes in today for recheck of his diabetes. Patient has been currently taking no medicine currently, diet control. Patient is currently on an ACE inhibitor/ARB. Patient has not seen an ophthalmologist this year. Patient denies any new issues with their feet. The symptom started onset as an adult hypertension and hyperlipidemia ARE RELATED TO DM   Hypertension Patient is currently on hydrochlorothiazide and losartan, and their blood pressure today is 131/84. Patient denies any lightheadedness or dizziness. Patient denies headaches, blurred vision, chest pains, shortness of breath, or weakness. Denies any side effects from medication and is content with current medication.   Hyperlipidemia Patient is coming in for recheck of his hyperlipidemia. The patient is currently taking simvastatin. They deny any issues with myalgias or history of liver damage from it. They deny any focal numbness or weakness or chest pain.   Relevant past medical, surgical, family and social history reviewed and updated as indicated. Interim medical history since our last visit reviewed. Allergies and medications reviewed and updated.  Review of Systems  Constitutional:  Negative for chills and fever.  Eyes:  Negative for visual disturbance.  Respiratory:  Negative for shortness of breath and wheezing.   Cardiovascular:  Negative for chest pain and leg swelling.  Musculoskeletal:  Negative for back pain and gait problem.  Skin:  Negative for rash.  Neurological:  Negative for dizziness and light-headedness.  All other systems reviewed and are  negative.   Per HPI unless specifically indicated above   Allergies as of 11/07/2023       Reactions   Lisinopril    Erythromycin Rash        Medication List        Accurate as of November 07, 2023  8:40 AM. If you have any questions, ask your nurse or doctor.          acetaminophen 500 MG tablet Commonly known as: TYLENOL Take 1,000 mg by mouth every 6 (six) hours as needed for mild pain (pain score 1-3).   aspirin 81 MG tablet Take 81 mg by mouth daily.   B-D 3CC LUER-LOK SYR 22GX1" 22G X 1" 3 ML Misc Generic drug: SYRINGE-NEEDLE (DISP) 3 ML USE TO GIVE TESTOSTERONE   Syringe/Needle (Disp) 18G X 1" 3 ML Misc Use as directed Use to draw up testosterone   BD Disp Needles 18G X 1-1/2" Misc Generic drug: NEEDLE (DISP) 18 G USE TO draw UP testosterone   fluticasone 50 MCG/ACT nasal spray Commonly known as: FLONASE Place 1 spray into both nostrils daily.   hydrochlorothiazide 25 MG tablet Commonly known as: HYDRODIURIL Take 1 tablet (25 mg total) by mouth daily.   loratadine 10 MG tablet Commonly known as: CLARITIN Take 10 mg by mouth daily.   losartan 100 MG tablet Commonly known as: COZAAR Take 1 tablet (100 mg total) by mouth daily.   meclizine 25 MG tablet Commonly known as: ANTIVERT Take 0.5-1 tablets (12.5-25 mg total) by mouth 3 (three) times daily as needed for dizziness.   meloxicam  15 MG tablet Commonly known as: MOBIC Take 1 tablet (15 mg total) by mouth daily.   montelukast 10 MG tablet Commonly known as: SINGULAIR Take 1 tablet (10 mg total) by mouth at bedtime.   predniSONE 10 MG tablet Commonly known as: DELTASONE Take 5 daily for 2 days followed by 4,3,2 and 1 for 2 days each.   sertraline 100 MG tablet Commonly known as: ZOLOFT Take 1 tablet (100 mg total) by mouth daily.   sildenafil 20 MG tablet Commonly known as: REVATIO Take 20 mg by mouth as needed.   simvastatin 40 MG tablet Commonly known as: ZOCOR Take 1 tablet (40  mg total) by mouth daily.   testosterone cypionate 100 MG/ML injection Commonly known as: DEPOTESTOTERONE CYPIONATE INJECT 1 ML INTRAMUSCULARLY EVERY 7 DAYS   Vitamin D (Ergocalciferol) 1.25 MG (50000 UNIT) Caps capsule Commonly known as: DRISDOL Take 1 capsule (50,000 Units total) by mouth every 7 (seven) days for 12 doses.         Objective:   BP 131/84   Pulse 73   Ht 5\' 7"  (1.702 m)   Wt 252 lb (114.3 kg)   SpO2 95%   BMI 39.47 kg/m   Wt Readings from Last 3 Encounters:  11/07/23 252 lb (114.3 kg)  11/01/23 253 lb (114.8 kg)  10/18/23 250 lb 8 oz (113.6 kg)    Physical Exam Vitals and nursing note reviewed.  Constitutional:      General: He is not in acute distress.    Appearance: He is well-developed. He is not diaphoretic.  Eyes:     General: No scleral icterus.    Conjunctiva/sclera: Conjunctivae normal.  Neck:     Thyroid: No thyromegaly.  Cardiovascular:     Rate and Rhythm: Normal rate and regular rhythm.     Heart sounds: Normal heart sounds. No murmur heard. Pulmonary:     Effort: Pulmonary effort is normal. No respiratory distress.     Breath sounds: Normal breath sounds. No wheezing.  Musculoskeletal:        General: No swelling. Normal range of motion.     Cervical back: Neck supple.  Lymphadenopathy:     Cervical: No cervical adenopathy.  Skin:    General: Skin is warm and dry.     Findings: No rash.  Neurological:     Mental Status: He is alert and oriented to person, place, and time.     Coordination: Coordination normal.  Psychiatric:        Behavior: Behavior normal.       Assessment & Plan:   Problem List Items Addressed This Visit       Cardiovascular and Mediastinum   Hypertension associated with diabetes (HCC)     Endocrine   Diabetes mellitus (HCC) - Primary   Relevant Orders   Bayer DCA Hb A1c Waived   Microalbumin/Creatinine Ratio, Urine     Other   Hyperlipidemia   Other Visit Diagnoses       Hyperlipidemia  associated with type 2 diabetes mellitus (HCC)       Relevant Orders   Lipid panel     A1c is 7.1, slightly up but has had some steroids.  Mainly just focus on diet to get back down.  Blood pressure everything else looks good today.  No other changes.  Follow up plan: Return in about 3 months (around 02/06/2024), or if symptoms worsen or fail to improve, for Diabetes and hypertension and cholesterol.  Counseling provided for all  of the vaccine components Orders Placed This Encounter  Procedures   Bayer DCA Hb A1c Waived   Lipid panel   Microalbumin/Creatinine Ratio, Urine    Arville Care, MD [facility] 11/07/2023, 8:40 AM

## 2023-11-08 LAB — LIPID PANEL
Chol/HDL Ratio: 4.2 ratio (ref 0.0–5.0)
Cholesterol, Total: 144 mg/dL (ref 100–199)
HDL: 34 mg/dL — ABNORMAL LOW (ref >39)
LDL Chol Calc (NIH): 76 mg/dL (ref 0–99)
Triglycerides: 202 mg/dL — ABNORMAL HIGH (ref 0–149)
VLDL Cholesterol Cal: 34 mg/dL (ref 5–40)

## 2023-11-08 LAB — MICROALBUMIN / CREATININE URINE RATIO
Creatinine, Urine: 151.2 mg/dL
Microalb/Creat Ratio: 8 mg/g{creat} (ref 0–29)
Microalbumin, Urine: 12.4 ug/mL

## 2023-11-09 ENCOUNTER — Encounter: Payer: Self-pay | Admitting: Family Medicine

## 2023-12-03 ENCOUNTER — Ambulatory Visit: Admitting: Family Medicine

## 2023-12-03 ENCOUNTER — Encounter: Payer: Self-pay | Admitting: Family Medicine

## 2023-12-03 VITALS — BP 126/70 | HR 62 | Temp 98.1°F | Ht 67.0 in | Wt 254.0 lb

## 2023-12-03 DIAGNOSIS — M170 Bilateral primary osteoarthritis of knee: Secondary | ICD-10-CM

## 2023-12-03 DIAGNOSIS — E1169 Type 2 diabetes mellitus with other specified complication: Secondary | ICD-10-CM | POA: Diagnosis not present

## 2023-12-03 DIAGNOSIS — M1710 Unilateral primary osteoarthritis, unspecified knee: Secondary | ICD-10-CM

## 2023-12-03 NOTE — Progress Notes (Signed)
 Subjective:  Patient ID: Steve Williams, male    DOB: Dec 14, 1955  Age: 68 y.o. MRN: [id number]  CC: Knee Pain (Ongoing knee pain. Needs joint injection in both. )   HPI Steve Williams presents for pain in both knees.  He has been told he has bone-on-bone arthritis of both knees by his orthopedist.  He has had rooster comb injections that helped for a while he has had multiple injections of both knees that have lasted up to a year.  However most recently they seem to only be lasting about 90 days.  He is considering joint replacement.  He is also considering stem cell injections.  He states there is a place across from [facility] that does them now.  He does not look forward to having surgery.  He does not like to be put to sleep and he does not like to be cut on.  We discussed knee replacement and is the standard of care for his situation he is aware of that.  He states his knee pain is 7-8/10 constantly.  Sits a lot and his knees stiffen up.  He is also on concrete floors a lot at work.     11/07/2023    8:11 AM 10/04/2023   12:15 PM 06/05/2023   10:31 AM  Depression screen PHQ 2/9  Decreased Interest 0 0 0  Down, Depressed, Hopeless 0 0 0  PHQ - 2 Score 0 0 0  Altered sleeping  0 0  Tired, decreased energy  0 0  Change in appetite  0 0  Feeling bad or failure about yourself   0 0  Trouble concentrating  0 0  Moving slowly or fidgety/restless  0 0  Suicidal thoughts  0 0  PHQ-9 Score  0 0  Difficult doing work/chores  Not difficult at all Not difficult at all    History Steve Williams has a past medical history of Allergy, Anxiety, Arthritis, Asthma, Cancer (HCC), Depression, Diabetes mellitus without complication (HCC), Hyperlipidemia, and Hypertension.   He has a past surgical history that includes Knee surgery (Left, 1974); Knee arthroscopy (Left); Carpal bone excision (Bilateral); Trigger finger release (Right); and lipoma removed.   His family history includes Cancer in his father;  Diabetes in his father, mother, and sister; Heart disease in his father and mother; Hypertension in his mother and sister; Migraines in his son.He reports that he has never smoked. He has never used smokeless tobacco. He reports that he does not drink alcohol and does not use drugs.    ROS Review of Systems  Constitutional:  Negative for fever.  Respiratory:  Negative for shortness of breath.   Cardiovascular:  Negative for chest pain.  Musculoskeletal:  Negative for arthralgias.  Skin:  Negative for rash.    Objective:  BP 126/70   Pulse 62   Temp 98.1 F (36.7 C)   Ht 5\' 7"  (1.702 m)   Wt 254 lb (115.2 kg)   SpO2 95%   BMI 39.78 kg/m   BP Readings from Last 3 Encounters:  12/03/23 126/70  11/07/23 131/84  11/01/23 138/80    Wt Readings from Last 3 Encounters:  12/03/23 254 lb (115.2 kg)  11/07/23 252 lb (114.3 kg)  11/01/23 253 lb (114.8 kg)     Physical Exam Vitals reviewed.  Constitutional:      Appearance: He is well-developed.  HENT:     Head: Normocephalic and atraumatic.     Right Ear: External ear normal.  Left Ear: External ear normal.     Mouth/Throat:     Pharynx: No oropharyngeal exudate or posterior oropharyngeal erythema.  Eyes:     Pupils: Pupils are equal, round, and reactive to light.  Cardiovascular:     Rate and Rhythm: Normal rate and regular rhythm.     Heart sounds: No murmur heard. Pulmonary:     Effort: No respiratory distress.     Breath sounds: Normal breath sounds.  Musculoskeletal:     Cervical back: Normal range of motion and neck supple.  Neurological:     Mental Status: He is alert and oriented to person, place, and time.     After obtaining informed consent, A steroid injection was performed at both knees. The lateral compartment was cleansed with betadine. With 1.5 inch, 23 gauge needle using 2.5 ml marcan and 9 mg of Celestone  the joint space was entered and injected. This was well tolerated. Simple dressing  applied.   Assessment & Plan:  Arthritis of knee  Type 2 diabetes mellitus with other specified complication, without long-term current use of insulin  (HCC)  Joint injections performed. Reviewed elevation of glucose related to steroid injection.    Follow-up: Return if symptoms worsen or fail to improve.  Roise Cleaver, M.D.

## 2024-01-06 ENCOUNTER — Other Ambulatory Visit: Payer: Self-pay | Admitting: Urology

## 2024-01-06 DIAGNOSIS — E291 Testicular hypofunction: Secondary | ICD-10-CM

## 2024-01-15 ENCOUNTER — Ambulatory Visit (INDEPENDENT_AMBULATORY_CARE_PROVIDER_SITE_OTHER)

## 2024-01-15 VITALS — BP 126/70 | HR 62 | Ht 67.0 in | Wt 254.0 lb

## 2024-01-15 DIAGNOSIS — Z0000 Encounter for general adult medical examination without abnormal findings: Secondary | ICD-10-CM

## 2024-01-15 DIAGNOSIS — D225 Melanocytic nevi of trunk: Secondary | ICD-10-CM | POA: Diagnosis not present

## 2024-01-15 DIAGNOSIS — D485 Neoplasm of uncertain behavior of skin: Secondary | ICD-10-CM | POA: Diagnosis not present

## 2024-01-15 DIAGNOSIS — L578 Other skin changes due to chronic exposure to nonionizing radiation: Secondary | ICD-10-CM | POA: Diagnosis not present

## 2024-01-15 DIAGNOSIS — D2271 Melanocytic nevi of right lower limb, including hip: Secondary | ICD-10-CM | POA: Diagnosis not present

## 2024-01-15 DIAGNOSIS — M25549 Pain in joints of unspecified hand: Secondary | ICD-10-CM | POA: Diagnosis not present

## 2024-01-15 DIAGNOSIS — L988 Other specified disorders of the skin and subcutaneous tissue: Secondary | ICD-10-CM | POA: Diagnosis not present

## 2024-01-15 DIAGNOSIS — Z1283 Encounter for screening for malignant neoplasm of skin: Secondary | ICD-10-CM | POA: Diagnosis not present

## 2024-01-15 DIAGNOSIS — L119 Acantholytic disorder, unspecified: Secondary | ICD-10-CM | POA: Diagnosis not present

## 2024-01-15 DIAGNOSIS — D2261 Melanocytic nevi of right upper limb, including shoulder: Secondary | ICD-10-CM | POA: Diagnosis not present

## 2024-01-15 DIAGNOSIS — L111 Transient acantholytic dermatosis [Grover]: Secondary | ICD-10-CM | POA: Diagnosis not present

## 2024-01-15 NOTE — Progress Notes (Signed)
 Subjective:   Steve Williams is a 68 y.o. who presents for a Medicare Wellness preventive visit.  As a reminder, Annual Wellness Visits don't include a physical exam, and some assessments may be limited, especially if this visit is performed virtually. We may recommend an in-person follow-up visit with your provider if needed.  Visit Complete: Virtual I connected with  Steve Williams on 01/15/24 by a audio enabled telemedicine application and verified that I am speaking with the correct person using two identifiers.  Patient Location: Home  Provider Location: Home Office  I discussed the limitations of evaluation and management by telemedicine. The patient expressed understanding and agreed to proceed.  Vital Signs: Because this visit was a virtual/telehealth visit, some criteria may be missing or patient reported. Any vitals not documented were not able to be obtained and vitals that have been documented are patient reported.  VideoDeclined- This patient was not accessible to to have audio and video telecommunications due to pt was at work. Therefore the visit was completed with audio only.  Persons Participating in Visit: Patient.  AWV Questionnaire: No: Patient Medicare AWV questionnaire was not completed prior to this visit.  Cardiac Risk Factors include: advanced age (>1men, >24 women);dyslipidemia;hypertension;male gender;obesity (BMI >30kg/m2)     Objective:    Today's Vitals   01/15/24 1422  BP: 126/70  Pulse: 62  Weight: 254 lb (115.2 kg)  Height: 5' 7 (1.702 m)   Body mass index is 39.78 kg/m.     01/15/2024    2:14 PM 11/01/2023    9:03 AM 10/18/2023    2:45 PM 07/25/2023   10:31 AM 07/23/2023    3:28 AM 08/29/2022    2:32 PM 08/26/2021    1:46 PM  Advanced Directives  Does Patient Have a Medical Advance Directive? No No No No No No No  Would patient like information on creating a medical advance directive?  No - Patient declined No - Patient declined No -  Patient declined No - Patient declined No - Patient declined No - Patient declined    Current Medications (verified) Outpatient Encounter Medications as of 01/15/2024  Medication Sig   acetaminophen  (TYLENOL ) 500 MG tablet Take 1,000 mg by mouth every 6 (six) hours as needed for mild pain (pain score 1-3).   aspirin  81 MG tablet Take 81 mg by mouth daily.   B-D 3CC LUER-LOK SYR 22GX1 22G X 1 3 ML MISC USE TO GIVE TESTOSTERONE    BD DISP NEEDLES 18G X 1-1/2 MISC USE TO draw UP testosterone    fluticasone  (FLONASE ) 50 MCG/ACT nasal spray Place 1 spray into both nostrils daily.   hydrochlorothiazide  (HYDRODIURIL ) 25 MG tablet Take 1 tablet (25 mg total) by mouth daily.   loratadine  (CLARITIN ) 10 MG tablet Take 10 mg by mouth daily.   losartan  (COZAAR ) 100 MG tablet Take 1 tablet (100 mg total) by mouth daily.   meclizine  (ANTIVERT ) 25 MG tablet Take 0.5-1 tablets (12.5-25 mg total) by mouth 3 (three) times daily as needed for dizziness.   meloxicam  (MOBIC ) 15 MG tablet Take 1 tablet (15 mg total) by mouth daily.   montelukast  (SINGULAIR ) 10 MG tablet Take 1 tablet (10 mg total) by mouth at bedtime.   predniSONE  (DELTASONE ) 10 MG tablet Take 5 daily for 2 days followed by 4,3,2 and 1 for 2 days each.   sertraline  (ZOLOFT ) 100 MG tablet Take 1 tablet (100 mg total) by mouth daily.   sildenafil  (REVATIO ) 20 MG tablet Take 20  mg by mouth as needed.   simvastatin  (ZOCOR ) 40 MG tablet Take 1 tablet (40 mg total) by mouth daily.   Syringe/Needle, Disp, 18G X 1 3 ML MISC Use as directed Use to draw up testosterone    testosterone  cypionate (DEPOTESTOTERONE CYPIONATE) 100 MG/ML injection INJECT 1mL intramusculary EVERY 7 DAYS   No facility-administered encounter medications on file as of 01/15/2024.    Allergies (verified) Lisinopril and Erythromycin   History: Past Medical History:  Diagnosis Date   Allergy    Anxiety    Arthritis    Asthma    Cancer (HCC)    Depression    Diabetes  mellitus without complication (HCC)    GERD (gastroesophageal reflux disease)    Hyperlipidemia    Hypertension    Sleep apnea    Past Surgical History:  Procedure Laterality Date   CARPAL BONE EXCISION Bilateral    KNEE ARTHROSCOPY Left    KNEE SURGERY Left 1974   lipoma removed     TRIGGER FINGER RELEASE Right    Family History  Problem Relation Age of Onset   Heart disease Mother    Hypertension Mother    Diabetes Mother    Anxiety disorder Mother    Arthritis Mother    Heart disease Father    Cancer Father        liver   Diabetes Father    Arthritis Father    Asthma Father    Hypertension Sister    Diabetes Sister    Arthritis Sister    Migraines Son    Asthma Paternal Grandmother    Cancer Paternal Grandmother    Social History   Socioeconomic History   Marital status: Legally Separated    Spouse name: Not on file   Number of children: 2   Years of education: Not on file   Highest education level: Master's degree (e.g., MA, MS, MEng, MEd, MSW, MBA)  Occupational History   Occupation: metal scrapping part time    Employer: Production assistant, [employer] FOR SELF EMPLOYED  Tobacco Use   Smoking status: Never   Smokeless tobacco: Never  Vaping Use   Vaping status: Never Used  Substance and Sexual Activity   Alcohol use: No   Drug use: No   Sexual activity: Not on file  Other Topics Concern   Not on file  Social History Narrative   One son lives in [place], other son in [place]   Social Drivers of Health   Financial Resource Strain: Low Risk  (01/15/2024)   Overall Financial Resource Strain (CARDIA)    Difficulty of Paying Living Expenses: Not hard at all  Food Insecurity: No Food Insecurity (01/15/2024)   Hunger Vital Sign    Worried About Running Out of Food in the Last Year: Never true    Ran Out of Food in the Last Year: Never true  Transportation Needs: No Transportation Needs (01/15/2024)   PRAPARE - Administrator, Civil Service (Medical): No     Lack of Transportation (Non-Medical): No  Physical Activity: Inactive (01/15/2024)   Exercise Vital Sign    Days of Exercise per Week: 0 days    Minutes of Exercise per Session: Not on file  Stress: No Stress Concern Present (01/15/2024)   [employer] of Occupational Health - Occupational Stress Questionnaire    Feeling of Stress: Not at all  Social Connections: Socially Isolated (01/15/2024)   Social Connection and Isolation Panel    Frequency of Communication with Friends and Family:  More than three times a week    Frequency of Social Gatherings with Friends and Family: More than three times a week    Attends Religious Services: Never    Database administrator or Organizations: No    Attends Engineer, structural: Never    Marital Status: Separated    Tobacco Counseling Counseling given: Not Answered    Clinical Intake:  Pre-visit preparation completed: Yes  Pain : No/denies pain     BMI - recorded: 39.78 Nutritional Status: BMI > 30  Obese Nutritional Risks: None Diabetes: Yes CBG done?: No  Lab Results  Component Value Date   HGBA1C 7.1 (H) 11/07/2023   HGBA1C 6.8 (H) 07/25/2023   HGBA1C 6.5 (H) 05/09/2023     How often do you need to have someone help you when you read instructions, pamphlets, or other written materials from your doctor or pharmacy?: 1 - Never  Interpreter Needed?: No  Information entered by :: Alia T/cma   Activities of Daily Living     01/15/2024    2:12 PM  In your present state of health, do you have any difficulty performing the following activities:  Hearing? 1  Vision? 0  Difficulty concentrating or making decisions? 0  Walking or climbing stairs? 0  Dressing or bathing? 0  Doing errands, shopping? 0  Preparing Food and eating ? N  Using the Toilet? N  In the past six months, have you accidently leaked urine? Y  Do you have problems with loss of bowel control? N  Managing your Medications? N  Managing your  Finances? N  Housekeeping or managing your Housekeeping? N    Patient Care Team: Dettinger, Lucio Sabin, MD as PCP - General (Family Medicine) Precilla Broaden, MD as Referring Physician (Otolaryngology) Zula Hitch, MD (Chiropractic Medicine) Trent Frizzle, MD as Consulting Physician (Urology) [facility]  I have updated your Care Teams any recent Medical Services you may have received from other providers in the past year.     Assessment:   This is a routine wellness examination for Steve Williams.  Hearing/Vision screen Hearing Screening - Comments:: Pt some hearing loss/have hearing aids Vision Screening - Comments:: Pt wear glasses/pt goes to [facility] in [place], .last of 1/25   Goals Addressed             This Visit's Progress    Patient Stated       Pt would like to get his wife back       Depression Screen     01/15/2024    2:15 PM 11/07/2023    8:11 AM 10/04/2023   12:15 PM 06/05/2023   10:31 AM 05/09/2023    8:03 AM 11/03/2022    8:18 AM 10/23/2022    8:16 AM  PHQ 2/9 Scores  PHQ - 2 Score 0 0 0 0 0 0 0  PHQ- 9 Score 0  0 0  0 0    Fall Risk     01/15/2024    2:09 PM 11/07/2023    8:11 AM 10/04/2023   12:15 PM 06/05/2023   10:31 AM 05/09/2023    8:03 AM  Fall Risk   Falls in the past year? 0 0 0 1 0  Number falls in past yr: 0 0 0 0   Injury with Fall? 0 0 0 0   Risk for fall due to : No Fall Risks No Fall Risks No Fall Risks History of fall(s)  Follow up Falls evaluation completed Falls evaluation completed Falls evaluation completed Education provided     MEDICARE RISK AT HOME:  Medicare Risk at Home Any stairs in or around the home?: Yes If so, are there any without handrails?: Yes Home free of loose throw rugs in walkways, pet beds, electrical cords, etc?: Yes Adequate lighting in your home to reduce risk of falls?: Yes Life alert?: No Use of a cane, walker or w/c?: No Grab bars in the bathroom?: Yes Shower chair or bench in  shower?: No Elevated toilet seat or a handicapped toilet?: No  TIMED UP AND GO:  Was the test performed?  no  Cognitive Function: 6CIT completed        01/15/2024    2:16 PM 08/29/2022    2:32 PM 08/26/2021    1:49 PM 08/25/2020    2:24 PM  6CIT Screen  What Year? 0 points 0 points 0 points 0 points  What month? 0 points 0 points 0 points 0 points  What time? 0 points 0 points 0 points 0 points  Count back from 20 0 points 0 points 0 points 0 points  Months in reverse 0 points 0 points 0 points 0 points  Repeat phrase 0 points 0 points 0 points 0 points  Total Score 0 points 0 points 0 points 0 points    Immunizations Immunization History  Administered Date(s) Administered   Influenza, Seasonal, Injecte, Preservative Fre 07/03/2016   Influenza,inj,Quad PF,6+ Mos 05/17/2017   Influenza-Unspecified 04/29/2015, 05/15/2018   PFIZER(Purple Top)SARS-COV-2 Vaccination 04/12/2020, 05/03/2020   Pneumococcal Conjugate-13 01/25/2011   Pneumococcal Polysaccharide-23 04/07/2016   Td 11/01/2009   Tdap 11/01/2009, 11/03/2022   Zoster, Live 08/17/2010    Screening Tests Health Maintenance  Topic Date Due   OPHTHALMOLOGY EXAM  08/04/2023   Pneumococcal Vaccine: 50+ Years (3 of 3 - PCV20 or PCV21) 05/08/2024 (Originally 04/07/2021)   Zoster Vaccines- Shingrix (1 of 2) 11/06/2024 (Originally 09/07/1973)   INFLUENZA VACCINE  02/29/2024   FOOT EXAM  05/08/2024   HEMOGLOBIN A1C  05/08/2024   Colonoscopy  08/18/2024   Diabetic kidney evaluation - eGFR measurement  10/03/2024   Diabetic kidney evaluation - Urine ACR  11/06/2024   Medicare Annual Wellness (AWV)  01/14/2025   DTaP/Tdap/Td (4 - Td or Tdap) 11/02/2032   HPV VACCINES  Aged Out   Meningococcal B Vaccine  Aged Out   COVID-19 Vaccine  Discontinued   Hepatitis C Screening  Discontinued    Health Maintenance  Health Maintenance Due  Topic Date Due   OPHTHALMOLOGY EXAM  08/04/2023   Health Maintenance Items Addressed: See  Nurse Notes at the end of this note  Additional Screening:  Vision Screening: Recommended annual ophthalmology exams for early detection of glaucoma and other disorders of the eye. Would you like a referral to an eye doctor? No    Dental Screening: Recommended annual dental exams for proper oral hygiene  Community Resource Referral / Chronic Care Management: CRR required this visit?  No   CCM required this visit?  No   Plan:    I have personally reviewed and noted the following in the patient's chart:   Medical and social history Use of alcohol, tobacco or illicit drugs  Current medications and supplements including opioid prescriptions. Patient is not currently taking opioid prescriptions. Functional ability and status Nutritional status Physical activity Advanced directives List of other physicians Hospitalizations, surgeries, and ER visits in previous 12 months Vitals Screenings to include cognitive, depression, and  falls Referrals and appointments  In addition, I have reviewed and discussed with patient certain preventive protocols, quality metrics, and best practice recommendations. A written personalized care plan for preventive services as well as general preventive health recommendations were provided to patient.   Michaelle Adolphus, CMA   01/15/2024   After Visit Summary: (MyChart) Due to this being a telephonic visit, the after visit summary with patients personalized plan was offered to patient via MyChart   Notes: Nothing significant to report at this time.

## 2024-01-15 NOTE — Patient Instructions (Signed)
 Steve Williams , Thank you for taking time out of your busy schedule to complete your Annual Wellness Visit with me. I enjoyed our conversation and look forward to speaking with you again next year. I, as well as your care team,  appreciate your ongoing commitment to your health goals. Please review the following plan we discussed and let me know if I can assist you in the future. Your Game plan/ To Do List    Follow up Visits: Next Medicare AWV with our clinical staff: 01/15/25 at 3:50pm    Next Office Visit with your provider: 02/18/24 at 8:25a.m.  Clinician Recommendations:  Aim for 30 minutes of exercise or brisk walking, 6-8 glasses of water, and 5 servings of fruits and vegetables each day.       This is a list of the screening recommended for you and due dates:  Health Maintenance  Topic Date Due   Eye exam for diabetics  08/04/2023   Pneumococcal Vaccine for age over 25 (3 of 3 - PCV20 or PCV21) 05/08/2024*   Zoster (Shingles) Vaccine (1 of 2) 11/06/2024*   Flu Shot  02/29/2024   Complete foot exam   05/08/2024   Hemoglobin A1C  05/08/2024   Colon Cancer Screening  08/18/2024   Yearly kidney function blood test for diabetes  10/03/2024   Yearly kidney health urinalysis for diabetes  11/06/2024   Medicare Annual Wellness Visit  01/14/2025   DTaP/Tdap/Td vaccine (4 - Td or Tdap) 11/02/2032   HPV Vaccine  Aged Out   Meningitis B Vaccine  Aged Out   COVID-19 Vaccine  Discontinued   Hepatitis C Screening  Discontinued  *Topic was postponed. The date shown is not the original due date.    Advanced directives: (Declined) Advance directive discussed with you today. Even though you declined this today, please call our office should you change your mind, and we can give you the proper paperwork for you to fill out. Advance Care Planning is important because it:  [x]  Makes sure you receive the medical care that is consistent with your values, goals, and preferences  [x]  It provides  guidance to your family and loved ones and reduces their decisional burden about whether or not they are making the right decisions based on your wishes.  Follow the link provided in your after visit summary or read over the paperwork we have mailed to you to help you started getting your Advance Directives in place. If you need assistance in completing these, please reach out to us  so that we can help you!  See attachments for Preventive Care and Fall Prevention Tips.

## 2024-01-29 DIAGNOSIS — L988 Other specified disorders of the skin and subcutaneous tissue: Secondary | ICD-10-CM | POA: Diagnosis not present

## 2024-01-29 DIAGNOSIS — D485 Neoplasm of uncertain behavior of skin: Secondary | ICD-10-CM | POA: Diagnosis not present

## 2024-02-05 ENCOUNTER — Ambulatory Visit (HOSPITAL_COMMUNITY)
Admission: RE | Admit: 2024-02-05 | Discharge: 2024-02-05 | Disposition: A | Discharge status: Home / Self Care | Source: Ambulatory Visit | Attending: Oncology | Admitting: Oncology

## 2024-02-05 ENCOUNTER — Other Ambulatory Visit: Payer: Self-pay | Admitting: Oncology

## 2024-02-05 ENCOUNTER — Encounter (HOSPITAL_COMMUNITY): Payer: Self-pay

## 2024-02-05 ENCOUNTER — Inpatient Hospital Stay: Attending: Oncology

## 2024-02-05 DIAGNOSIS — K760 Fatty (change of) liver, not elsewhere classified: Secondary | ICD-10-CM | POA: Diagnosis not present

## 2024-02-05 DIAGNOSIS — D751 Secondary polycythemia: Secondary | ICD-10-CM | POA: Diagnosis not present

## 2024-02-05 DIAGNOSIS — N281 Cyst of kidney, acquired: Secondary | ICD-10-CM | POA: Diagnosis not present

## 2024-02-05 DIAGNOSIS — R7989 Other specified abnormal findings of blood chemistry: Secondary | ICD-10-CM

## 2024-02-05 DIAGNOSIS — Z832 Family history of diseases of the blood and blood-forming organs and certain disorders involving the immune mechanism: Secondary | ICD-10-CM | POA: Insufficient documentation

## 2024-02-05 DIAGNOSIS — N3289 Other specified disorders of bladder: Secondary | ICD-10-CM | POA: Diagnosis not present

## 2024-02-05 DIAGNOSIS — K869 Disease of pancreas, unspecified: Secondary | ICD-10-CM | POA: Insufficient documentation

## 2024-02-05 DIAGNOSIS — D696 Thrombocytopenia, unspecified: Secondary | ICD-10-CM | POA: Insufficient documentation

## 2024-02-05 LAB — CBC WITH DIFFERENTIAL/PLATELET
Abs Immature Granulocytes: 0.05 K/uL (ref 0.00–0.07)
Basophils Absolute: 0.1 K/uL (ref 0.0–0.1)
Basophils Relative: 1 %
Eosinophils Absolute: 0.1 K/uL (ref 0.0–0.5)
Eosinophils Relative: 1 %
HCT: 49.8 % (ref 39.0–52.0)
Hemoglobin: 18.1 g/dL — ABNORMAL HIGH (ref 13.0–17.0)
Immature Granulocytes: 1 %
Lymphocytes Relative: 17 %
Lymphs Abs: 1.1 K/uL (ref 0.7–4.0)
MCH: 31.1 pg (ref 26.0–34.0)
MCHC: 36.3 g/dL — ABNORMAL HIGH (ref 30.0–36.0)
MCV: 85.6 fL (ref 80.0–100.0)
Monocytes Absolute: 0.5 K/uL (ref 0.1–1.0)
Monocytes Relative: 8 %
Neutro Abs: 4.6 K/uL (ref 1.7–7.7)
Neutrophils Relative %: 72 %
Platelets: 116 K/uL — ABNORMAL LOW (ref 150–400)
RBC: 5.82 MIL/uL — ABNORMAL HIGH (ref 4.22–5.81)
RDW: 13.2 % (ref 11.5–15.5)
WBC: 6.4 K/uL (ref 4.0–10.5)
nRBC: 0 % (ref 0.0–0.2)

## 2024-02-05 LAB — COMPREHENSIVE METABOLIC PANEL WITH GFR
ALT: 22 U/L (ref 0–44)
AST: 19 U/L (ref 15–41)
Albumin: 3.9 g/dL (ref 3.5–5.0)
Alkaline Phosphatase: 65 U/L (ref 38–126)
Anion gap: 10 (ref 5–15)
BUN: 12 mg/dL (ref 8–23)
CO2: 25 mmol/L (ref 22–32)
Calcium: 8.9 mg/dL (ref 8.9–10.3)
Chloride: 99 mmol/L (ref 98–111)
Creatinine, Ser: 0.77 mg/dL (ref 0.61–1.24)
GFR, Estimated: >60 mL/min (ref >60)
Glucose, Bld: 155 mg/dL — ABNORMAL HIGH (ref 70–99)
Potassium: 3.9 mmol/L (ref 3.5–5.1)
Sodium: 134 mmol/L — ABNORMAL LOW (ref 135–145)
Total Bilirubin: 1.2 mg/dL (ref 0.0–1.2)
Total Protein: 6.7 g/dL (ref 6.5–8.1)

## 2024-02-05 LAB — FERRITIN: Ferritin: 339 ng/mL — ABNORMAL HIGH (ref 24–336)

## 2024-02-05 LAB — IRON AND TIBC
Iron: 99 ug/dL (ref 45–182)
Saturation Ratios: 33 % (ref 17.9–39.5)
TIBC: 300 ug/dL (ref 250–450)
UIBC: 201 ug/dL

## 2024-02-05 LAB — POCT I-STAT CREATININE: Creatinine, Ser: 0.8 mg/dL (ref 0.61–1.24)

## 2024-02-05 MED ORDER — IOHEXOL 300 MG/ML  SOLN
100.0000 mL | Freq: Once | INTRAMUSCULAR | Status: AC | PRN
Start: 2024-02-05 — End: 2024-02-05
  Administered 2024-02-05: 100 mL via INTRAVENOUS

## 2024-02-13 ENCOUNTER — Inpatient Hospital Stay: Admitting: Oncology

## 2024-02-13 VITALS — HR 98 | Temp 97.7°F | Resp 18 | Wt 250.0 lb

## 2024-02-13 DIAGNOSIS — K869 Disease of pancreas, unspecified: Secondary | ICD-10-CM | POA: Diagnosis not present

## 2024-02-13 DIAGNOSIS — R7989 Other specified abnormal findings of blood chemistry: Secondary | ICD-10-CM

## 2024-02-13 DIAGNOSIS — D751 Secondary polycythemia: Secondary | ICD-10-CM | POA: Insufficient documentation

## 2024-02-13 DIAGNOSIS — Z832 Family history of diseases of the blood and blood-forming organs and certain disorders involving the immune mechanism: Secondary | ICD-10-CM | POA: Diagnosis not present

## 2024-02-13 DIAGNOSIS — D696 Thrombocytopenia, unspecified: Secondary | ICD-10-CM

## 2024-02-13 NOTE — Assessment & Plan Note (Addendum)
 Patient has a family history of hemochromatosis in father.  He was never tested for hemochromatosis.  Patient also reports hypogonadism and osteoarthritis.  Liver enzymes normal.  Ferritin level in 700s.  -HFE gene positive for H63D: Heterozygous -Abnormal test result can explain elevated ferritin levels but should not cause any other complications of hemochromatosis. -Ferritin level stable at this time.  Return to clinic in 6 months with labs

## 2024-02-13 NOTE — Progress Notes (Signed)
 [facility] at [facility]  HEMATOLOGY FOLLOW-UP VISIT  Williams, Steve LABOR, MD  REASON FOR FOLLOW-UP: Thrombocytopenia Hemochromatosis  ASSESSMENT & PLAN:  Patient is a 68 y.o. male following for chronic thrombocytopenia and a family history of hemochromatosis.    Assessment & Plan Elevated ferritin Patient has a family history of hemochromatosis in father.  He was never tested for hemochromatosis.  Patient also reports hypogonadism and osteoarthritis.  Liver enzymes normal.  Ferritin level in 700s.  -HFE gene positive for H63D: Heterozygous -Abnormal test result can explain elevated ferritin levels but should not cause any other complications of hemochromatosis. -Ferritin level stable at this time.  Return to clinic in 6 months with labs Thrombocytopenia [facility]) Patient has a chronic history of thrombocytopenia lasting at least 30 years.  The counts are stable.  No increased episodes of bleeding or petechiae.  Some platelet clumping reported in CBC.  Negative hepatitis panel.  Ultrasound of abdomen showed no hepatosplenomegaly.  -Continue to monitor counts for now.   -No further workup needed -If platelets trend down, will obtain more labs  Return to clinic in 6 months Pancreatic lesion Hypoechoic mass observed on ultrasound in the pancreas.  Recommended to obtain CT scan CT scan showed no pancreatic abnormality  No further workup needed at this time Polycythemia, secondary Slightly elevated hemoglobin but hematocrit slightly over 49 Likely contribution by renal cyst as well Asymptomatic  - Continue to monitor at this time -Will obtain EPO level with next blood draw   Orders Placed This Encounter  Procedures   Ferritin    Standing Status:   Future    Expected Date:   08/11/2024    Expiration Date:   11/09/2024   CBC with Differential/Platelet    Standing Status:   Future    Expected Date:   08/11/2024    Expiration Date:   11/09/2024    Comprehensive metabolic panel with GFR    Standing Status:   Future    Expected Date:   08/11/2024    Expiration Date:   11/09/2024   Iron and TIBC    Standing Status:   Future    Expected Date:   08/11/2024    Expiration Date:   11/09/2024   Erythropoietin    Standing Status:   Future    Expected Date:   08/11/2024    Expiration Date:   11/09/2024    The total time spent in the appointment was 20 minutes encounter with patients including review of chart and various tests results, discussions about plan of care and coordination of care plan   All questions were answered. The patient knows to call the clinic with any problems, questions or concerns. No barriers to learning was detected.  Mickiel Dry, MD 7/16/202510:08 AM   SUMMARY OF HEMATOLOGIC HISTORY: Chronic thrombocytopenia:  -Hepatitis panel negative -Ultrasound did not show hepatosplenomegaly -Platelet count has been stable for 30 years   2.  Family history of hemochromatosis:  -HFE gene positive for H63D: Heterozygous   INTERVAL HISTORY: Steve Williams 68 y.o. male following for thrombocytopenia and family history of hemochromatosis. He has no complaints today.   Denies abdominal pain, nausea, vomiting, night sweats, weight loss.  Overall has been doing really well.  He does not report any increased episodes of bleeding or bruising.   We discussed at the CT scan findings in detail.  Discussed that there is a benign renal cyst and no pancreatic lesion that is concerning.  I have  reviewed the past medical history, past surgical history, social history and family history with the patient   ALLERGIES:  is allergic to lisinopril and erythromycin.  MEDICATIONS:  Current Outpatient Medications  Medication Sig Dispense Refill   acetaminophen  (TYLENOL ) 500 MG tablet Take 1,000 mg by mouth every 6 (six) hours as needed for mild pain (pain score 1-3).     aspirin  81 MG tablet Take 81 mg by mouth daily.     B-D 3CC LUER-LOK SYR  22GX1 22G X 1 3 ML MISC USE TO GIVE TESTOSTERONE  10 each 6   BD DISP NEEDLES 18G X 1-1/2 MISC USE TO draw UP testosterone  20 each 6   fluticasone  (FLONASE ) 50 MCG/ACT nasal spray Place 1 spray into both nostrils daily.     hydrochlorothiazide  (HYDRODIURIL ) 25 MG tablet Take 1 tablet (25 mg total) by mouth daily. 90 tablet 3   loratadine  (CLARITIN ) 10 MG tablet Take 10 mg by mouth daily.     losartan  (COZAAR ) 100 MG tablet Take 1 tablet (100 mg total) by mouth daily. 90 tablet 3   meclizine  (ANTIVERT ) 25 MG tablet Take 0.5-1 tablets (12.5-25 mg total) by mouth 3 (three) times daily as needed for dizziness. 30 tablet 0   meloxicam  (MOBIC ) 15 MG tablet Take 1 tablet (15 mg total) by mouth daily. 90 tablet 3   montelukast  (SINGULAIR ) 10 MG tablet Take 1 tablet (10 mg total) by mouth at bedtime. 90 tablet 3   predniSONE  (DELTASONE ) 10 MG tablet Take 5 daily for 2 days followed by 4,3,2 and 1 for 2 days each. 30 tablet 0   sertraline  (ZOLOFT ) 100 MG tablet Take 1 tablet (100 mg total) by mouth daily. 90 tablet 3   sildenafil  (REVATIO ) 20 MG tablet Take 20 mg by mouth as needed.     simvastatin  (ZOCOR ) 40 MG tablet Take 1 tablet (40 mg total) by mouth daily. 90 tablet 3   Syringe/Needle, Disp, 18G X 1 3 ML MISC Use as directed Use to draw up testosterone  10 each 6   testosterone  cypionate (DEPOTESTOTERONE CYPIONATE) 100 MG/ML injection INJECT 1mL intramusculary EVERY 7 DAYS 10 mL 2   No current facility-administered medications for this visit.     REVIEW OF SYSTEMS:   Constitutional: Denies fevers, chills or night sweats Eyes: Denies blurriness of vision Ears, nose, mouth, throat, and face: Denies mucositis or sore throat Respiratory: Denies cough, dyspnea or wheezes Cardiovascular: Denies palpitation, chest discomfort or lower extremity swelling Gastrointestinal:  Denies nausea, heartburn or change in bowel habits Skin: Denies abnormal skin rashes Lymphatics: Denies new lymphadenopathy or  easy bruising Neurological:Denies numbness, tingling or new weaknesses Behavioral/Psych: Mood is stable, no new changes  All other systems were reviewed with the patient and are negative.  PHYSICAL EXAMINATION:   Vitals:   02/13/24 0905  Pulse: 98  Resp: 18  Temp: 97.7 F (36.5 C)  SpO2: 95%    GENERAL:alert, no distress and comfortable SKIN: skin color, texture, turgor are normal, no rashes or some erythematous scaly skin lesions on the chest. LUNGS: clear to auscultation and percussion with normal breathing effort HEART: regular rate & rhythm and no murmurs and no lower extremity edema ABDOMEN:abdomen soft, non-tender and normal bowel sounds Musculoskeletal:no cyanosis of digits and no clubbing  NEURO: alert & oriented x 3 with fluent speech  LABORATORY DATA:  I have reviewed the data as listed  Lab Results  Component Value Date   WBC 6.4 02/05/2024   NEUTROABS 4.6 02/05/2024  HGB 18.1 (H) 02/05/2024   HCT 49.8 02/05/2024   MCV 85.6 02/05/2024   PLT 116 (L) 02/05/2024      Chemistry      Component Value Date/Time   NA 134 (L) 02/05/2024 0823   NA 139 10/04/2023 1235   K 3.9 02/05/2024 0823   CL 99 02/05/2024 0823   CO2 25 02/05/2024 0823   BUN 12 02/05/2024 0823   BUN 12 10/04/2023 1235   CREATININE 0.80 02/05/2024 0900      Component Value Date/Time   CALCIUM 8.9 02/05/2024 0823   ALKPHOS 65 02/05/2024 0823   AST 19 02/05/2024 0823   ALT 22 02/05/2024 0823   BILITOT 1.2 02/05/2024 0823   BILITOT 0.6 10/04/2023 1235      Latest Reference Range & Units 10/18/23 15:38 10/18/23 15:43  Hep A Ab, IgM NON REACTIVE  NON REACTIVE   Hepatitis B Surface Ag NON REACTIVE  NON REACTIVE NON REACTIVE  Hep B S Ab NON REACTIVE  Reactive !   Hep B Core Ab, IgM NON REACTIVE  NON REACTIVE   HEP B CORE AB Negative  Negative   HCV Ab NON REACTIVE  NON REACTIVE   !: Data is abnormal  HFE gene analysis:  Results:  c.845G>A (p.Cys282Tyr) - Not Detected  c.187C>G  (p.His63Asp) - Detected, heterozygous  c.193A>T (p.Ser65Cys) - Not Detected    Latest Reference Range & Units 10/05/23 12:06  Iron 38 - 169 ug/dL 80  UIBC 888 - 656 ug/dL 859  TIBC 749 - 549 ug/dL 779 (L)  Ferritin 30 - 400 ng/mL 769 (H)  Iron Saturation 15 - 55 % 36  Folate >3.0 ng/mL 9.5  Vitamin B12 232 - 1,245 pg/mL 279  (L): Data is abnormally low (H): Data is abnormally high  RADIOGRAPHIC STUDIES: I have personally reviewed the radiological images as listed and agreed with the findings in the report.  CT ABDOMEN PELVIS W WO CONTRAST EXAM: CT ABDOMEN AND PELVIS WITH AND WITHOUT CONTRAST 02/05/2024 09:06:47 AM  TECHNIQUE: CT of the abdomen and pelvis was performed with and without the administration of intravenous contrast. Multiplanar reformatted images are provided for review. Automated exposure control, iterative reconstruction, and/or weight based adjustment of the mA/kV was utilized to reduce the radiation dose to as low as reasonably achievable.  COMPARISON: None available.  CLINICAL HISTORY: Pancreatic hypodensity on ultrasound. F/u Pancreatic hypodensity on ultrasound, Pancreatic lesion  FINDINGS:  LOWER CHEST: No acute abnormality.  LIVER: Mild hepatic steatosis.  GALLBLADDER AND BILE DUCTS: Gallbladder is unremarkable. No biliary ductal dilatation.  SPLEEN: No acute abnormality.  PANCREAS: Pancreas is within normal limits on CT.  ADRENAL GLANDS: No acute abnormality.  KIDNEYS, URETERS AND BLADDER: 16 mm simple right upper pole renal cyst (series 15 image 27), benign (Bosniak 1). No follow up is recommended. No stones in the kidneys or ureters. No hydronephrosis. No perinephric or periureteral stranding. Mildly thick walled bladder, although underdistended, suggesting sequelae of chronic bladder outlet obstruction.  GI AND BOWEL: Normal appendix (series 9 image 95). Stomach demonstrates no acute abnormality. There is no bowel obstruction.  No bowel wall thickening.  PERITONEUM AND RETROPERITONEUM: No ascites. No free air.  VASCULATURE: Atherosclerotic calcifications of the abdominal aorta and branch vessels.  LYMPH NODES: No lymphadenopathy.  REPRODUCTIVE ORGANS: Prostatomegaly, with enlargement of the central gland indenting the base of the bladder, suggesting BPH.  BONES AND SOFT TISSUES: Degenerative changes of the visualized thoracolumbar spine. Mild fat in the left inguinal canal. No acute  osseous abnormality.  IMPRESSION: 1. Pancreas is within normal limits on CT. 2. Prostatomegaly, suggesting BPH. 3. Additional ancillary findings as above.  Electronically signed by: Pinkie Pebbles MD 02/05/2024 01:28 PM EDT RP Workstation: [id number]

## 2024-02-13 NOTE — Assessment & Plan Note (Addendum)
 Hypoechoic mass observed on ultrasound in the pancreas.  Recommended to obtain CT scan CT scan showed no pancreatic abnormality  No further workup needed at this time

## 2024-02-13 NOTE — Assessment & Plan Note (Addendum)
 Slightly elevated hemoglobin but hematocrit slightly over 49 Likely contribution by renal cyst as well Asymptomatic  - Continue to monitor at this time -Will obtain EPO level with next blood draw

## 2024-02-13 NOTE — Assessment & Plan Note (Addendum)
 Patient has a chronic history of thrombocytopenia lasting at least 30 years.  The counts are stable.  No increased episodes of bleeding or petechiae.  Some platelet clumping reported in CBC.  Negative hepatitis panel.  Ultrasound of abdomen showed no hepatosplenomegaly.  -Continue to monitor counts for now.   -No further workup needed -If platelets trend down, will obtain more labs  Return to clinic in 6 months

## 2024-02-18 ENCOUNTER — Encounter: Payer: Self-pay | Admitting: Family Medicine

## 2024-02-18 ENCOUNTER — Ambulatory Visit: Admitting: Family Medicine

## 2024-02-18 VITALS — BP 136/79 | Ht 67.0 in | Wt 252.0 lb

## 2024-02-18 DIAGNOSIS — E1159 Type 2 diabetes mellitus with other circulatory complications: Secondary | ICD-10-CM | POA: Diagnosis not present

## 2024-02-18 DIAGNOSIS — Z125 Encounter for screening for malignant neoplasm of prostate: Secondary | ICD-10-CM | POA: Diagnosis not present

## 2024-02-18 DIAGNOSIS — I152 Hypertension secondary to endocrine disorders: Secondary | ICD-10-CM | POA: Diagnosis not present

## 2024-02-18 DIAGNOSIS — E1169 Type 2 diabetes mellitus with other specified complication: Secondary | ICD-10-CM

## 2024-02-18 DIAGNOSIS — E785 Hyperlipidemia, unspecified: Secondary | ICD-10-CM | POA: Diagnosis not present

## 2024-02-18 LAB — BAYER DCA HB A1C WAIVED: HB A1C (BAYER DCA - WAIVED): 6.5 % — ABNORMAL HIGH (ref 4.8–5.6)

## 2024-02-18 MED ORDER — SERTRALINE HCL 100 MG PO TABS: 100.0000 mg | ORAL_TABLET | Freq: Every day | ORAL | 0 refills | Status: DC

## 2024-02-18 NOTE — Progress Notes (Signed)
 BP 136/79   Ht 5' 7 (1.702 m)   Wt 252 lb (114.3 kg)   SpO2 94%   BMI 39.47 kg/m    Subjective:   Patient ID: Steve Williams, male    DOB: November 06, 1955, 68 y.o.   MRN: [id number]  HPI: Steve Williams is a 68 y.o. male presenting on 02/18/2024 for Medical Management of Chronic Issues, Diabetes, Hyperlipidemia, and Hypertension   HPI Type 2 diabetes mellitus Patient comes in today for recheck of his diabetes. Patient has been currently taking no medicine, diet control. Patient is currently on an ACE inhibitor/ARB. Patient has not seen an ophthalmologist this year. Patient denies any new issues with their feet. The symptom started onset as an adult hypertension and hyperlipidemia ARE RELATED TO DM   Hypertension Patient is currently on losartan  and hydrochlorothiazide , and their blood pressure today is 136/79. Patient denies any lightheadedness or dizziness. Patient denies headaches, blurred vision, chest pains, shortness of breath, or weakness. Denies any side effects from medication and is content with current medication.   Hyperlipidemia Patient is coming in for recheck of his hyperlipidemia. The patient is currently taking simvastatin . They deny any issues with myalgias or history of liver damage from it. They deny any focal numbness or weakness or chest pain.   Relevant past medical, surgical, family and social history reviewed and updated as indicated. Interim medical history since our last visit reviewed. Allergies and medications reviewed and updated.  Review of Systems  Constitutional:  Negative for chills and fever.  Eyes:  Negative for visual disturbance.  Respiratory:  Negative for shortness of breath and wheezing.   Cardiovascular:  Negative for chest pain and leg swelling.  Musculoskeletal:  Negative for back pain and gait problem.  Skin:  Negative for rash.  Neurological:  Negative for dizziness and light-headedness.  All other systems reviewed and are negative.   Per  HPI unless specifically indicated above   Allergies as of 02/18/2024       Reactions   Lisinopril    Erythromycin Rash        Medication List        Accurate as of February 18, 2024  9:05 AM. If you have any questions, ask your nurse or doctor.          STOP taking these medications    meclizine  25 MG tablet Commonly known as: ANTIVERT  Stopped by: Fonda LABOR Zoye Chandra   predniSONE  10 MG tablet Commonly known as: DELTASONE  Stopped by: Fonda LABOR Jasai Sorg       TAKE these medications    acetaminophen  500 MG tablet Commonly known as: TYLENOL  Take 1,000 mg by mouth every 6 (six) hours as needed for mild pain (pain score 1-3).   aspirin  81 MG tablet Take 81 mg by mouth daily.   B-D 3CC LUER-LOK SYR 22GX1 22G X 1 3 ML Misc Generic drug: SYRINGE-NEEDLE (DISP) 3 ML USE TO GIVE TESTOSTERONE    Syringe/Needle (Disp) 18G X 1 3 ML Misc Use as directed Use to draw up testosterone    BD Disp Needles 18G X 1-1/2 Misc Generic drug: NEEDLE (DISP) 18 G USE TO draw UP testosterone    fluticasone  50 MCG/ACT nasal spray Commonly known as: FLONASE  Place 1 spray into both nostrils daily.   hydrochlorothiazide  25 MG tablet Commonly known as: HYDRODIURIL  Take 1 tablet (25 mg total) by mouth daily.   loratadine  10 MG tablet Commonly known as: CLARITIN  Take 10 mg by mouth daily.   losartan  100 MG  tablet Commonly known as: COZAAR  Take 1 tablet (100 mg total) by mouth daily.   meloxicam  15 MG tablet Commonly known as: MOBIC  Take 1 tablet (15 mg total) by mouth daily.   montelukast  10 MG tablet Commonly known as: SINGULAIR  Take 1 tablet (10 mg total) by mouth at bedtime.   sertraline  100 MG tablet Commonly known as: ZOLOFT  Take 1 tablet (100 mg total) by mouth daily. What changed: Another medication with the same name was added. Make sure you understand how and when to take each. Changed by: Fonda LABOR Trayvion Embleton   sertraline  100 MG tablet Commonly known as:  ZOLOFT  Take 1 tablet (100 mg total) by mouth daily. Lost some of his pills, go ahead and refill 1 -30-day prescription early What changed: You were already taking a medication with the same name, and this prescription was added. Make sure you understand how and when to take each. Changed by: Fonda LABOR Jemal Miskell   sildenafil  20 MG tablet Commonly known as: REVATIO  Take 20 mg by mouth as needed.   simvastatin  40 MG tablet Commonly known as: ZOCOR  Take 1 tablet (40 mg total) by mouth daily.   testosterone  cypionate 100 MG/ML injection Commonly known as: DEPOTESTOTERONE CYPIONATE INJECT 1mL intramusculary EVERY 7 DAYS         Objective:   BP 136/79   Ht 5' 7 (1.702 m)   Wt 252 lb (114.3 kg)   SpO2 94%   BMI 39.47 kg/m   Wt Readings from Last 3 Encounters:  02/18/24 252 lb (114.3 kg)  02/13/24 250 lb (113.4 kg)  01/15/24 254 lb (115.2 kg)    Physical Exam Vitals and nursing note reviewed.  Constitutional:      General: He is not in acute distress.    Appearance: He is well-developed. He is not diaphoretic.  Eyes:     General: No scleral icterus.       Right eye: No discharge.     Conjunctiva/sclera: Conjunctivae normal.     Pupils: Pupils are equal, round, and reactive to light.  Neck:     Thyroid : No thyromegaly.  Cardiovascular:     Rate and Rhythm: Normal rate and regular rhythm.     Heart sounds: Normal heart sounds. No murmur heard. Pulmonary:     Effort: Pulmonary effort is normal. No respiratory distress.     Breath sounds: Normal breath sounds. No wheezing.  Musculoskeletal:        General: Swelling (Trace peripheral edema bilateral lower extremities) present. Normal range of motion.     Cervical back: Neck supple.  Lymphadenopathy:     Cervical: No cervical adenopathy.  Skin:    General: Skin is warm and dry.     Findings: No rash.  Neurological:     Mental Status: He is alert and oriented to person, place, and time.     Coordination: Coordination  normal.  Psychiatric:        Behavior: Behavior normal.      Assessment & Plan:   Problem List Items Addressed This Visit       Cardiovascular and Mediastinum   Hypertension associated with diabetes (HCC)   Relevant Orders   Bayer DCA Hb A1c Waived   CBC with Differential/Platelet   CMP14+EGFR   Lipid panel     Endocrine   Diabetes mellitus (HCC)     Other   Hyperlipidemia   Morbid obesity (HCC)   Other Visit Diagnoses       Hyperlipidemia associated with  type 2 diabetes mellitus (HCC)    -  Primary   Relevant Orders   Bayer DCA Hb A1c Waived   CBC with Differential/Platelet   CMP14+EGFR   Lipid panel     Prostate cancer screening       Relevant Orders   PSA, total and free     A1c 6.5, blood pressure looks good.  Refilled 130-day early because he had lost some pills of his sertraline  but everything else seems to be doing good and continue on current medicine.  Follow up plan: Return in about 3 months (around 05/20/2024), or if symptoms worsen or fail to improve, for Diabetes recheck.  Counseling provided for all of the vaccine components Orders Placed This Encounter  Procedures   Bayer DCA Hb A1c Waived   CBC with Differential/Platelet   CMP14+EGFR   Lipid panel   PSA, total and free    Fonda Levins, MD [facility] 02/18/2024, 9:05 AM

## 2024-02-19 LAB — LIPID PANEL
Chol/HDL Ratio: 3.7 ratio (ref 0.0–5.0)
Cholesterol, Total: 123 mg/dL (ref 100–199)
HDL: 33 mg/dL — ABNORMAL LOW (ref >39)
LDL Chol Calc (NIH): 66 mg/dL (ref 0–99)
Triglycerides: 132 mg/dL (ref 0–149)
VLDL Cholesterol Cal: 24 mg/dL (ref 5–40)

## 2024-02-19 LAB — CBC WITH DIFFERENTIAL/PLATELET
Basophils Absolute: 0.1 x10E3/uL (ref 0.0–0.2)
Basos: 1 %
EOS (ABSOLUTE): 0.1 x10E3/uL (ref 0.0–0.4)
Eos: 2 %
Hematocrit: 51.2 % — ABNORMAL HIGH (ref 37.5–51.0)
Hemoglobin: 16.8 g/dL (ref 13.0–17.7)
Immature Grans (Abs): 0 x10E3/uL (ref 0.0–0.1)
Immature Granulocytes: 0 %
Lymphocytes Absolute: 1 x10E3/uL (ref 0.7–3.1)
Lymphs: 20 %
MCH: 29.6 pg (ref 26.6–33.0)
MCHC: 32.8 g/dL (ref 31.5–35.7)
MCV: 90 fL (ref 79–97)
Monocytes Absolute: 0.5 x10E3/uL (ref 0.1–0.9)
Monocytes: 9 %
Neutrophils Absolute: 3.6 x10E3/uL (ref 1.4–7.0)
Neutrophils: 67 %
Platelets: 114 x10E3/uL — ABNORMAL LOW (ref 150–450)
RBC: 5.67 x10E6/uL (ref 4.14–5.80)
RDW: 13.1 % (ref 11.6–15.4)
WBC: 5.3 x10E3/uL (ref 3.4–10.8)

## 2024-02-19 LAB — PSA, TOTAL AND FREE
PSA, Free Pct: 17.9 %
PSA, Free: 0.34 ng/mL
Prostate Specific Ag, Serum: 1.9 ng/mL (ref 0.0–4.0)

## 2024-02-19 LAB — CMP14+EGFR
ALT: 17 IU/L (ref 0–44)
AST: 15 IU/L (ref 0–40)
Albumin: 4 g/dL (ref 3.9–4.9)
Alkaline Phosphatase: 87 IU/L (ref 44–121)
BUN/Creatinine Ratio: 13 (ref 10–24)
BUN: 11 mg/dL (ref 8–27)
Bilirubin Total: 0.5 mg/dL (ref 0.0–1.2)
CO2: 22 mmol/L (ref 20–29)
Calcium: 9.4 mg/dL (ref 8.6–10.2)
Chloride: 100 mmol/L (ref 96–106)
Creatinine, Ser: 0.83 mg/dL (ref 0.76–1.27)
Globulin, Total: 1.7 g/dL (ref 1.5–4.5)
Glucose: 173 mg/dL — ABNORMAL HIGH (ref 70–99)
Potassium: 4 mmol/L (ref 3.5–5.2)
Sodium: 137 mmol/L (ref 134–144)
Total Protein: 5.7 g/dL — ABNORMAL LOW (ref 6.0–8.5)
eGFR: 95 mL/min/1.73 (ref >59)

## 2024-02-27 DIAGNOSIS — L578 Other skin changes due to chronic exposure to nonionizing radiation: Secondary | ICD-10-CM | POA: Diagnosis not present

## 2024-02-27 DIAGNOSIS — D0439 Carcinoma in situ of skin of other parts of face: Secondary | ICD-10-CM | POA: Diagnosis not present

## 2024-02-27 DIAGNOSIS — D485 Neoplasm of uncertain behavior of skin: Secondary | ICD-10-CM | POA: Diagnosis not present

## 2024-02-27 DIAGNOSIS — Z08 Encounter for follow-up examination after completed treatment for malignant neoplasm: Secondary | ICD-10-CM | POA: Diagnosis not present

## 2024-02-27 DIAGNOSIS — Z85820 Personal history of malignant melanoma of skin: Secondary | ICD-10-CM | POA: Diagnosis not present

## 2024-02-28 ENCOUNTER — Ambulatory Visit: Payer: Self-pay | Admitting: Family Medicine

## 2024-03-19 DIAGNOSIS — D0439 Carcinoma in situ of skin of other parts of face: Secondary | ICD-10-CM | POA: Diagnosis not present

## 2024-05-13 ENCOUNTER — Other Ambulatory Visit: Payer: Self-pay | Admitting: Family Medicine

## 2024-05-13 DIAGNOSIS — E1169 Type 2 diabetes mellitus with other specified complication: Secondary | ICD-10-CM

## 2024-05-13 DIAGNOSIS — M170 Bilateral primary osteoarthritis of knee: Secondary | ICD-10-CM

## 2024-05-13 DIAGNOSIS — F39 Unspecified mood [affective] disorder: Secondary | ICD-10-CM

## 2024-05-13 DIAGNOSIS — E1159 Type 2 diabetes mellitus with other circulatory complications: Secondary | ICD-10-CM

## 2024-05-13 DIAGNOSIS — J302 Other seasonal allergic rhinitis: Secondary | ICD-10-CM

## 2024-06-02 NOTE — Progress Notes (Unsigned)
 Impression/Assessment:  1.  Hypogonadism on 100 mg of testosterone  cypionate weekly.  This treats his symptoms fairly well  2.  ED, on 100 mg of sildenafil  with mixed results  3.  Overactive bladder symptoms  Plan:     History of Present Illness:   He is on 100 mg of testosterone  cypionate, administered every Sunday.  He takes sildenafil  on a as needed basis for ED.  He has not had testosterone  level drawn for a year.  Most recent hemoglobin within the past month was 17.1.   Past Medical History:  Diagnosis Date   Allergy    Anxiety    Arthritis    Asthma    Cancer (HCC)    Depression    Diabetes mellitus without complication (HCC)    GERD (gastroesophageal reflux disease)    Hyperlipidemia    Hypertension    Sleep apnea     Past Surgical History:  Procedure Laterality Date   CARPAL BONE EXCISION Bilateral    KNEE ARTHROSCOPY Left    KNEE SURGERY Left 1974   lipoma removed     TRIGGER FINGER RELEASE Right     Home Medications:  Allergies as of 06/03/2024       Reactions   Lisinopril    Erythromycin Rash        Medication List        Accurate as of June 02, 2024  8:55 AM. If you have any questions, ask your nurse or doctor.          acetaminophen  500 MG tablet Commonly known as: TYLENOL  Take 1,000 mg by mouth every 6 (six) hours as needed for mild pain (pain score 1-3).   aspirin  81 MG tablet Take 81 mg by mouth daily.   B-D 3CC LUER-LOK SYR 22GX1 22G X 1 3 ML Misc Generic drug: SYRINGE-NEEDLE (DISP) 3 ML USE TO GIVE TESTOSTERONE    Syringe/Needle (Disp) 18G X 1 3 ML Misc Use as directed Use to draw up testosterone    BD Disp Needles 18G X 1-1/2 Misc Generic drug: NEEDLE (DISP) 18 G USE TO draw UP testosterone    fluticasone  50 MCG/ACT nasal spray Commonly known as: FLONASE  Place 1 spray into both nostrils daily.   hydrochlorothiazide  25 MG tablet Commonly known as: HYDRODIURIL  TAKE 1 TABLET BY MOUTH DAILY   loratadine   10 MG tablet Commonly known as: CLARITIN  Take 10 mg by mouth daily.   losartan  100 MG tablet Commonly known as: COZAAR  TAKE 1 TABLET BY MOUTH DAILY   meloxicam  15 MG tablet Commonly known as: MOBIC  TAKE 1 TABLET BY MOUTH DAILY   montelukast  10 MG tablet Commonly known as: SINGULAIR  TAKE 1 TABLET BY MOUTH AT BEDTIME   sertraline  100 MG tablet Commonly known as: ZOLOFT  Take 1 tablet (100 mg total) by mouth daily. Lost some of his pills, go ahead and refill 1 -30-day prescription early   sertraline  100 MG tablet Commonly known as: ZOLOFT  TAKE 1 TABLET BY MOUTH DAILY   sildenafil  20 MG tablet Commonly known as: REVATIO  Take 20 mg by mouth as needed.   simvastatin  40 MG tablet Commonly known as: ZOCOR  TAKE 1 TABLET BY MOUTH DAILY   testosterone  cypionate 100 MG/ML injection Commonly known as: DEPOTESTOTERONE CYPIONATE INJECT 1mL intramusculary EVERY 7 DAYS        Allergies:  Allergies  Allergen Reactions   Lisinopril    Erythromycin Rash    Family History  Problem Relation Age of Onset   Heart disease Mother  Hypertension Mother    Diabetes Mother    Anxiety disorder Mother    Arthritis Mother    Heart disease Father    Cancer Father        liver   Diabetes Father    Arthritis Father    Asthma Father    Hypertension Sister    Diabetes Sister    Arthritis Sister    Migraines Son    Asthma Paternal Grandmother    Cancer Paternal Grandmother     Social History:  reports that he has never smoked. He has never used smokeless tobacco. He reports that he does not drink alcohol and does not use drugs.  ROS: A complete review of systems was performed.  All systems are negative except for pertinent findings as noted.  Physical Exam:  Vital signs in last 24 hours: There were no vitals taken for this visit. Constitutional:  Alert and oriented, No acute distress Cardiovascular: Regular rate  Respiratory: Normal respiratory effort Genitourinary: Phallus  is circumcised.  Testicles atrophic.  No inguinal hernias.  Prostate 40 g, symmetric, nonnodular, nontender. Lymphatic: No lymphadenopathy Neurologic: Grossly intact, no focal deficits Psychiatric: Normal mood and affect  I have reviewed prior pt notes  I have reviewed urinalysis results  I have reviewed prior PSA and testosterone  results  IPSS 14/3

## 2024-06-03 ENCOUNTER — Encounter: Payer: Self-pay | Admitting: Urology

## 2024-06-03 ENCOUNTER — Ambulatory Visit: Payer: Medicare Other | Admitting: Urology

## 2024-06-03 VITALS — BP 128/72 | HR 75 | Wt 245.0 lb

## 2024-06-03 DIAGNOSIS — F5232 Male orgasmic disorder: Secondary | ICD-10-CM

## 2024-06-03 DIAGNOSIS — E291 Testicular hypofunction: Secondary | ICD-10-CM

## 2024-06-03 DIAGNOSIS — R3915 Urgency of urination: Secondary | ICD-10-CM | POA: Diagnosis not present

## 2024-06-03 DIAGNOSIS — R350 Frequency of micturition: Secondary | ICD-10-CM

## 2024-06-03 DIAGNOSIS — N529 Male erectile dysfunction, unspecified: Secondary | ICD-10-CM

## 2024-06-03 DIAGNOSIS — N5201 Erectile dysfunction due to arterial insufficiency: Secondary | ICD-10-CM

## 2024-06-03 LAB — URINALYSIS, ROUTINE W REFLEX MICROSCOPIC
Bilirubin, UA: NEGATIVE
Ketones, UA: NEGATIVE
Leukocytes,UA: NEGATIVE
Nitrite, UA: NEGATIVE
Protein,UA: NEGATIVE
RBC, UA: NEGATIVE
Specific Gravity, UA: <1.005 — ABNORMAL LOW (ref 1.005–1.030)
Urobilinogen, Ur: 0.2 mg/dL (ref 0.2–1.0)
pH, UA: 6.5 (ref 5.0–7.5)

## 2024-06-05 ENCOUNTER — Encounter: Payer: Self-pay | Admitting: Family Medicine

## 2024-06-05 ENCOUNTER — Ambulatory Visit: Payer: Self-pay | Admitting: Family Medicine

## 2024-06-05 VITALS — BP 139/83 | HR 70 | Ht 67.0 in | Wt 249.0 lb

## 2024-06-05 DIAGNOSIS — E1169 Type 2 diabetes mellitus with other specified complication: Secondary | ICD-10-CM

## 2024-06-05 DIAGNOSIS — I152 Hypertension secondary to endocrine disorders: Secondary | ICD-10-CM

## 2024-06-05 DIAGNOSIS — M170 Bilateral primary osteoarthritis of knee: Secondary | ICD-10-CM | POA: Diagnosis not present

## 2024-06-05 DIAGNOSIS — E785 Hyperlipidemia, unspecified: Secondary | ICD-10-CM

## 2024-06-05 DIAGNOSIS — G4733 Obstructive sleep apnea (adult) (pediatric): Secondary | ICD-10-CM

## 2024-06-05 DIAGNOSIS — F39 Unspecified mood [affective] disorder: Secondary | ICD-10-CM

## 2024-06-05 DIAGNOSIS — J302 Other seasonal allergic rhinitis: Secondary | ICD-10-CM

## 2024-06-05 DIAGNOSIS — E1159 Type 2 diabetes mellitus with other circulatory complications: Secondary | ICD-10-CM

## 2024-06-05 LAB — BAYER DCA HB A1C WAIVED: HB A1C (BAYER DCA - WAIVED): 7.2 % — ABNORMAL HIGH (ref 4.8–5.6)

## 2024-06-05 MED ORDER — MONTELUKAST SODIUM 10 MG PO TABS: 10.0000 mg | ORAL_TABLET | Freq: Every day | ORAL | 3 refills | Status: AC

## 2024-06-05 MED ORDER — LOSARTAN POTASSIUM 100 MG PO TABS: 100.0000 mg | ORAL_TABLET | Freq: Every day | ORAL | 3 refills | Status: AC

## 2024-06-05 MED ORDER — MELOXICAM 15 MG PO TABS: 15.0000 mg | ORAL_TABLET | Freq: Every day | ORAL | 3 refills | Status: AC

## 2024-06-05 MED ORDER — SERTRALINE HCL 100 MG PO TABS: 100.0000 mg | ORAL_TABLET | Freq: Every day | ORAL | 3 refills | Status: AC

## 2024-06-05 MED ORDER — METHYLPREDNISOLONE ACETATE 80 MG/ML IJ SUSP
80.0000 mg | Freq: Once | INTRAMUSCULAR | Status: AC
  Administered 2024-06-05: 80 mg via INTRA_ARTICULAR

## 2024-06-05 MED ORDER — SIMVASTATIN 40 MG PO TABS: 40.0000 mg | ORAL_TABLET | Freq: Every day | ORAL | 3 refills | Status: AC

## 2024-06-05 MED ORDER — HYDROCHLOROTHIAZIDE 25 MG PO TABS: 25.0000 mg | ORAL_TABLET | Freq: Every day | ORAL | 3 refills | Status: AC

## 2024-06-05 NOTE — Progress Notes (Signed)
 BP 139/83   Pulse 70   Ht 5' 7 (1.702 m)   Wt 249 lb (112.9 kg)   SpO2 96%   BMI 39.00 kg/m    Subjective:   Patient ID: Steve Williams, male    DOB: 12-22-1955, 68 y.o.   MRN: [id number]  HPI: Steve Williams is a 68 y.o. male presenting on 06/05/2024 for Medical Management of Chronic Issues, Diabetes, Hyperlipidemia, and Hypertension   Discussed the use of AI scribe software for clinical note transcription with the patient, who gave verbal consent to proceed.  History of Present Illness   Steve Williams is a 68 year old male with diabetes and hypertension who presents for a recheck of his conditions.  Glycemic control - Diabetes mellitus with recent increase in hemoglobin A1c to 7.2 (previously 6.5) - Frequent consumption of sweets, suspected to be contributing to elevated blood glucose  Hypertension management - Hypertension managed with losartan  and hydrochlorothiazide  - No reported side effects from antihypertensive medications  Mood and anxiety symptoms - Stable mood and anxiety on sertraline  for an extended period - Delayed ejaculation over the past 1-2 years, suspected to be related to sertraline   Sleep apnea - Obstructive sleep apnea managed with CPAP therapy for approximately 20 years - CPAP machine set to auto-titrate, reaching a pressure of 14 cm H2O  Nasal lesion - Sore spot inside the nose, similar to a previous pimple that became infected - No current treatment applied  Testosterone  therapy - Receiving testosterone  injections - Pain at injection sites          Relevant past medical, surgical, family and social history reviewed and updated as indicated. Interim medical history since our last visit reviewed. Allergies and medications reviewed and updated.  Review of Systems  Constitutional:  Negative for chills and fever.  Eyes:  Negative for visual disturbance.  Respiratory:  Negative for shortness of breath and wheezing.   Cardiovascular:   Negative for chest pain and leg swelling.  Musculoskeletal:  Positive for arthralgias. Negative for back pain and gait problem.  Skin:  Negative for rash.  All other systems reviewed and are negative.   Per HPI unless specifically indicated above   Allergies as of 06/05/2024       Reactions   Lisinopril    Erythromycin Rash        Medication List        Accurate as of June 05, 2024  9:45 AM. If you have any questions, ask your nurse or doctor.          acetaminophen  500 MG tablet Commonly known as: TYLENOL  Take 1,000 mg by mouth every 6 (six) hours as needed for mild pain (pain score 1-3).   aspirin  81 MG tablet Take 81 mg by mouth daily.   B-D 3CC LUER-LOK SYR 22GX1 22G X 1 3 ML Misc Generic drug: SYRINGE-NEEDLE (DISP) 3 ML USE TO GIVE TESTOSTERONE    Syringe/Needle (Disp) 18G X 1 3 ML Misc Use as directed Use to draw up testosterone    BD Disp Needles 18G X 1-1/2 Misc Generic drug: NEEDLE (DISP) 18 G USE TO draw UP testosterone    fluticasone  50 MCG/ACT nasal spray Commonly known as: FLONASE  Place 1 spray into both nostrils daily.   hydrochlorothiazide  25 MG tablet Commonly known as: HYDRODIURIL  Take 1 tablet (25 mg total) by mouth daily.   loratadine  10 MG tablet Commonly known as: CLARITIN  Take 10 mg by mouth daily.   losartan  100 MG tablet  Commonly known as: COZAAR  Take 1 tablet (100 mg total) by mouth daily.   meloxicam  15 MG tablet Commonly known as: MOBIC  Take 1 tablet (15 mg total) by mouth daily.   montelukast  10 MG tablet Commonly known as: SINGULAIR  Take 1 tablet (10 mg total) by mouth at bedtime.   sertraline  100 MG tablet Commonly known as: ZOLOFT  Take 1 tablet (100 mg total) by mouth daily. What changed: Another medication with the same name was removed. Continue taking this medication, and follow the directions you see here. Changed by: Fonda LABOR Jermesha Sottile   sildenafil  20 MG tablet Commonly known as: REVATIO  Take 20 mg  by mouth as needed.   simvastatin  40 MG tablet Commonly known as: ZOCOR  Take 1 tablet (40 mg total) by mouth daily.   testosterone  cypionate 100 MG/ML injection Commonly known as: DEPOTESTOTERONE CYPIONATE INJECT 1mL intramusculary EVERY 7 DAYS               Durable Medical Equipment  (From admission, onward)           Start     Ordered   06/05/24 0000  For home use only DME continuous positive airway pressure (CPAP)       Question Answer Comment  Length of Need Lifetime   Patient has OSA or probable OSA Yes   Is the patient currently using CPAP in the home Yes   If yes (to question two) Determine DME provider and inform them of any new orders/settings   Settings Autotitration   Signs and symptoms of probable OSA  (select all that apply) Witnessed apneas   CPAP supplies needed Mask, headgear, cushions, filters, heated tubing and water chamber   Additional equipment included Heated humification and supplies      06/05/24 0945             Objective:   BP 139/83   Pulse 70   Ht 5' 7 (1.702 m)   Wt 249 lb (112.9 kg)   SpO2 96%   BMI 39.00 kg/m   Wt Readings from Last 3 Encounters:  06/05/24 249 lb (112.9 kg)  06/03/24 245 lb (111.1 kg)  02/18/24 252 lb (114.3 kg)    Physical Exam Physical Exam   VITALS: BP- 139/83 HEENT: Pimple inside nose. NECK: Thyroid  without lumps or nodules. CHEST: Lungs clear to auscultation. CARDIOVASCULAR: Heart sounds regular. EXTREMITIES: No cuts, no sores, good pulses, no swelling in feet.       Bilateral Knee injections: Consent form signed. Risk factors of bleeding and infection discussed with patient and patient is agreeable towards injection. Patient prepped with Betadine. Lateral approach towards injection used. Injected 80 mg of Depo-Medrol  and 1 mL of 2% lidocaine . Patient tolerated procedure well and no side effects from noted. Minimal to no bleeding. Simple bandage applied after.   Assessment & Plan:   Problem  List Items Addressed This Visit       Cardiovascular and Mediastinum   Hypertension associated with diabetes (HCC)   Relevant Medications   hydrochlorothiazide  (HYDRODIURIL ) 25 MG tablet   losartan  (COZAAR ) 100 MG tablet   simvastatin  (ZOCOR ) 40 MG tablet     Respiratory   Allergic rhinitis   Relevant Medications   montelukast  (SINGULAIR ) 10 MG tablet     Endocrine   Diabetes mellitus (HCC) - Primary   Relevant Medications   hydrochlorothiazide  (HYDRODIURIL ) 25 MG tablet   losartan  (COZAAR ) 100 MG tablet   simvastatin  (ZOCOR ) 40 MG tablet   Other Relevant Orders  Bayer DCA Hb A1c Waived     Other   Hyperlipidemia   Relevant Medications   hydrochlorothiazide  (HYDRODIURIL ) 25 MG tablet   losartan  (COZAAR ) 100 MG tablet   simvastatin  (ZOCOR ) 40 MG tablet   Mood disorder   Relevant Medications   sertraline  (ZOLOFT ) 100 MG tablet   Other Visit Diagnoses       Bilateral primary osteoarthritis of knee       Relevant Medications   meloxicam  (MOBIC ) 15 MG tablet   methylPREDNISolone  acetate (DEPO-MEDROL ) injection 80 mg   methylPREDNISolone  acetate (DEPO-MEDROL ) injection 80 mg     Hyperlipidemia associated with type 2 diabetes mellitus (HCC)       Relevant Medications   hydrochlorothiazide  (HYDRODIURIL ) 25 MG tablet   losartan  (COZAAR ) 100 MG tablet   simvastatin  (ZOCOR ) 40 MG tablet     OSA (obstructive sleep apnea)       Relevant Orders   For home use only DME continuous positive airway pressure (CPAP)          Type 2 diabetes mellitus Hemoglobin A1c increased to 7.2, indicating suboptimal glycemic control. Steroid injections may elevate blood glucose. - Advised reduction in sweets consumption. - Instructed to monitor blood glucose levels closely, especially after steroid injections. - Will discuss potential need for medication adjustment if A1c continues to rise.  Bilateral primary osteoarthritis of knee Chronic knee pain managed with steroid injections.  Discussed impact of steroids on blood glucose and vaccine efficacy. - Administered steroid injections. - Advised to avoid sweets for the next few days. - Will schedule a nurse visit for vaccine administration at least two weeks after injections.  Obstructive sleep apnea Managed with CPAP. Current equipment outdated. Discussed potential need for a new sleep study if insurance requires. - Provided prescription for CPAP equipment. - Advised to contact pharmacy for new CPAP equipment. - Will consider sleep study if required by insurance.  Mood disorder on sertraline  Managed with sertraline . Reports delayed ejaculation. Discussed tapering sertraline  and potential alternative medications. - Reduce sertraline  to 50 mg daily for a few weeks. - Monitor for changes in mood and sexual function. - Will consider alternative medications if anxiety increases.  Hyperlipidemia Managed with cholesterol medication. - Continue current cholesterol medication.  Acute nasal lesion Acute nasal lesion resembling a pimple. No signs of infection requiring drainage. - Apply triple antibiotic cream twice daily. - Monitor for signs of infection or increase in size.          Follow up plan: Return in about 3 months (around 09/05/2024), or if symptoms worsen or fail to improve, for Diabetes recheck.  Counseling provided for all of the vaccine components Orders Placed This Encounter  Procedures   For home use only DME continuous positive airway pressure (CPAP)   Bayer DCA Hb A1c Waived    Fonda Levins, MD [facility] 06/05/2024, 9:45 AM

## 2024-07-06 ENCOUNTER — Other Ambulatory Visit: Payer: Self-pay | Admitting: Urology

## 2024-07-06 DIAGNOSIS — E291 Testicular hypofunction: Secondary | ICD-10-CM

## 2024-07-10 ENCOUNTER — Other Ambulatory Visit: Payer: Self-pay | Admitting: Family Medicine

## 2024-07-24 ENCOUNTER — Other Ambulatory Visit: Payer: Self-pay | Admitting: Urology

## 2024-07-24 DIAGNOSIS — E291 Testicular hypofunction: Secondary | ICD-10-CM

## 2024-07-25 ENCOUNTER — Telehealth: Payer: Self-pay | Admitting: Family Medicine

## 2024-07-25 NOTE — Telephone Encounter (Signed)
 Pt called in and said his C-PAP is getting ready to die. He needs new rx called into a company called [employer]. HE NEEDS ASAP

## 2024-07-27 ENCOUNTER — Other Ambulatory Visit: Payer: Self-pay

## 2024-07-27 DIAGNOSIS — E291 Testicular hypofunction: Secondary | ICD-10-CM

## 2024-07-27 NOTE — Telephone Encounter (Signed)
 SABRA

## 2024-07-28 ENCOUNTER — Other Ambulatory Visit: Payer: Self-pay | Admitting: Urology

## 2024-07-28 DIAGNOSIS — E291 Testicular hypofunction: Secondary | ICD-10-CM

## 2024-07-28 MED ORDER — TESTOSTERONE CYPIONATE 200 MG/ML IM SOLN: INTRAMUSCULAR | 2 refills | Status: DC

## 2024-07-28 MED ORDER — TESTOSTERONE CYPIONATE 200 MG/ML IM SOLN: INTRAMUSCULAR | 1 refills | Status: AC

## 2024-07-28 NOTE — Telephone Encounter (Signed)
 Please refill if appropriate

## 2024-07-29 ENCOUNTER — Other Ambulatory Visit: Payer: Self-pay | Admitting: Family Medicine

## 2024-07-29 DIAGNOSIS — G4733 Obstructive sleep apnea (adult) (pediatric): Secondary | ICD-10-CM

## 2024-07-29 NOTE — Telephone Encounter (Signed)
 PRINTED ON YOUR DESK TO SIGN

## 2024-07-30 NOTE — Telephone Encounter (Signed)
 Faxed to [employer] along with demographics and insurance card to [phone].

## 2024-07-30 NOTE — Telephone Encounter (Signed)
 Done

## 2024-08-06 ENCOUNTER — Inpatient Hospital Stay: Attending: Oncology

## 2024-08-06 DIAGNOSIS — R7989 Other specified abnormal findings of blood chemistry: Secondary | ICD-10-CM

## 2024-08-06 LAB — CBC WITH DIFFERENTIAL/PLATELET
Abs Immature Granulocytes: 0.02 K/uL (ref 0.00–0.07)
Basophils Absolute: 0.1 K/uL (ref 0.0–0.1)
Basophils Relative: 1 %
Eosinophils Absolute: 0.1 K/uL (ref 0.0–0.5)
Eosinophils Relative: 2 %
HCT: 49.9 % (ref 39.0–52.0)
Hemoglobin: 17.7 g/dL — ABNORMAL HIGH (ref 13.0–17.0)
Immature Granulocytes: 0 %
Lymphocytes Relative: 19 %
Lymphs Abs: 1.1 K/uL (ref 0.7–4.0)
MCH: 30.6 pg (ref 26.0–34.0)
MCHC: 35.5 g/dL (ref 30.0–36.0)
MCV: 86.2 fL (ref 80.0–100.0)
Monocytes Absolute: 0.5 K/uL (ref 0.1–1.0)
Monocytes Relative: 9 %
Neutro Abs: 4.1 K/uL (ref 1.7–7.7)
Neutrophils Relative %: 69 %
Platelets: 116 K/uL — ABNORMAL LOW (ref 150–400)
RBC: 5.79 MIL/uL (ref 4.22–5.81)
RDW: 12.8 % (ref 11.5–15.5)
WBC: 6 K/uL (ref 4.0–10.5)
nRBC: 0 % (ref 0.0–0.2)

## 2024-08-06 LAB — COMPREHENSIVE METABOLIC PANEL WITH GFR
ALT: 22 U/L (ref 0–44)
AST: 19 U/L (ref 15–41)
Albumin: 4.3 g/dL (ref 3.5–5.0)
Alkaline Phosphatase: 89 U/L (ref 38–126)
Anion gap: 14 (ref 5–15)
BUN: 12 mg/dL (ref 8–23)
CO2: 23 mmol/L (ref 22–32)
Calcium: 9.2 mg/dL (ref 8.9–10.3)
Chloride: 100 mmol/L (ref 98–111)
Creatinine, Ser: 0.77 mg/dL (ref 0.61–1.24)
GFR, Estimated: >60 mL/min
Glucose, Bld: 184 mg/dL — ABNORMAL HIGH (ref 70–99)
Potassium: 4.1 mmol/L (ref 3.5–5.1)
Sodium: 137 mmol/L (ref 135–145)
Total Bilirubin: 0.9 mg/dL (ref 0.0–1.2)
Total Protein: 6.7 g/dL (ref 6.5–8.1)

## 2024-08-06 LAB — IRON AND TIBC
Iron: 109 ug/dL (ref 45–182)
Saturation Ratios: 40 % — ABNORMAL HIGH (ref 17.9–39.5)
TIBC: 270 ug/dL (ref 250–450)
UIBC: 161 ug/dL

## 2024-08-06 LAB — FERRITIN: Ferritin: 797 ng/mL — ABNORMAL HIGH (ref 24–336)

## 2024-08-08 LAB — ERYTHROPOIETIN: Erythropoietin: 28.2 m[IU]/mL — ABNORMAL HIGH (ref 2.6–18.5)

## 2024-08-13 ENCOUNTER — Inpatient Hospital Stay: Admitting: Physician Assistant

## 2024-08-13 VITALS — BP 132/88 | HR 73 | Temp 97.4°F | Resp 18 | Wt 252.5 lb

## 2024-08-13 DIAGNOSIS — D696 Thrombocytopenia, unspecified: Secondary | ICD-10-CM

## 2024-08-13 DIAGNOSIS — K869 Disease of pancreas, unspecified: Secondary | ICD-10-CM | POA: Diagnosis not present

## 2024-08-13 DIAGNOSIS — R7989 Other specified abnormal findings of blood chemistry: Secondary | ICD-10-CM

## 2024-08-13 DIAGNOSIS — E538 Deficiency of other specified B group vitamins: Secondary | ICD-10-CM | POA: Diagnosis not present

## 2024-08-13 DIAGNOSIS — D751 Secondary polycythemia: Secondary | ICD-10-CM

## 2024-08-13 MED ORDER — CYANOCOBALAMIN 500 MCG PO TABS: 500.0000 ug | ORAL_TABLET | Freq: Every day | ORAL | 3 refills | Status: AC

## 2024-08-13 NOTE — Patient Instructions (Signed)
 [facility] **VISIT SUMMARY & IMPORTANT INSTRUCTIONS **   You were seen today by Pleasant Barefoot PA-C for your follow-up visit.    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  This can cause elevated ferritin, but is unlikely to cause any further complications from hemochromatosis. As long as you are ferritin is <1000, we will continue monitoring.  LOW PLATELETS Your low platelets may be related to fatty liver disease or immune system dysfunction (ITP). As long as your platelets are higher than 100, we will continue surveillance. We will check additional labs (nutritional panel) at your next visit.  VITAMIN B-12 DEFICIENCY Start taking vitamin B12 500 mcg daily.  ELEVATED HEMOGLOBIN Your elevated hemoglobin/red blood cells is related to your testosterone  injections, sleep apnea, and dehydration. Make sure that you are drinking at least 64 ounces of water daily. No treatment is needed at this time, but you could consider voluntary blood donation to keep your blood counts at a normal level.  FOLLOW-UP APPOINTMENT: 6 months  ** Thank you for trusting me with your healthcare!  I strive to provide all of my patients with quality care at each visit.  If you receive a survey for this visit, I would be so grateful to you for taking the time to provide feedback.  Thank you in advance!  ~ Lameeka Schleifer                                        Dr. Mickiel Davonna Pleasant Barefoot, PA-C          Delon Hope, NP   - - - - - - - - - - - - - - - - - -    Thank you for choosing [facility] at [facility] to provide your oncology and hematology care.  To afford each patient quality time with our provider, please arrive at least 15 minutes before your scheduled appointment time.   If you have a lab appointment with the Cancer Center please  come in thru the Main Entrance and check in at the main information desk.  You need to re-schedule your appointment should you arrive 10 or more minutes late.  We strive to give you quality time with our providers, and arriving late affects you and other patients whose appointments are after yours.  Also, if you no show three or more times for appointments you may be dismissed from the clinic at the providers discretion.     Again, thank you for choosing [facility].  Our hope is that these requests will decrease the amount of time that you wait before being seen by our physicians.       _____________________________________________________________  Should you have questions after your visit to [facility], please contact our office at [phone] and follow the prompts.  Our office hours are 8:00 a.m. and 4:30 p.m. Monday - Friday.  Please note that voicemails left after 4:00 p.m. may not be returned until the following business day.  We are closed weekends and major holidays.  You do have access to a nurse 24-7, just call the main number to the clinic [phone] and do not press any options, hold on the line and a nurse  will answer the phone.    For prescription refill requests, have your pharmacy contact our office and allow 72 hours.

## 2024-08-13 NOTE — Progress Notes (Signed)
 "  Steve [address], [place] [address]   CLINIC:  Medical Oncology/Hematology  PCP:  Dettinger, Fonda LABOR, MD [address] [place] [address] [phone]   REASON FOR VISIT:  Follow-up for elevated ferritin, thrombocytopenia, and secondary polycythemia  PRIOR THERAPY: None  CURRENT THERAPY: Surveillance  INTERVAL HISTORY:   Steve Williams 69 y.o. male returns for routine follow-up of elevated ferritin, thrombocytopenia, and secondary polycythemia He was last seen by Dr. Davonna on 02/13/2024.  In the interim since last visit, he has not had any surgeries, hospitalizations, or changes in baseline health status.   At today's visit, he reports feeling fairly well.  He  reports 80% energy and 100% appetite.   He  is maintaining stable weight at this time.  Patient denies any excessive iron intake, vitamin C supplementation, or alcohol consumption. No abnormal bleeding or bruising. He has chronic tinnitus.  Denies any other vasomotor symptoms, severe headaches, or fatigue. He is compliant with CPAP. He takes weekly testosterone  injections. He admits to poor water intake, primarily drinking tea and Diet Coke.  ASSESSMENT & PLAN:  1.  Elevated ferritin, H63D heterozygous - Family history of hemochromatosis in father - HFE gene positive for H63D heterozygosity - Ferritin levels elevated in the 700s at baseline.  Liver enzymes have been normal. - Most recent labs (08/06/2024): Ferritin 797.  Normal LFTs. - PLAN: H63D heterozygosity explains elevated ferritin levels, but is unlikely to cause any other complications of hemochromatosis. - Ferritin levels stable at this time, with liver enzymes normal.  Consider phlebotomy if ferritin > 1,000 - No treatment needed at this time.  Patient educated to avoid excessive iron or vitamin C intake, and to limit alcohol consumption.  - RTC in 6 months with labs.  2.  Thrombocytopenia - Patient has a chronic history of  thrombocytopenia lasting at least 30 years. - Platelet counts are stable around 100-120.  Some platelet clumping reported in CBC. - Labs from PCP (10/05/2023) showed marginally low vitamin B12 at 279, normal folate 9.5. - Hematology workup: Negative hepatitis panel US  abdomen (10/26/2023) with fatty infiltration of the liver.  Borderline splenomegaly with spleen measuring 13.4 cm, volume 706 cc. CT abdomen/pelvis (02/05/2024) with mild hepatic steatosis, no spleen abnormality. - Drinks alcohol rarely.   - No increased episodes of bleeding or petechiae. - DIFFERENTIAL DIAGNOSIS: Longstanding mild thrombocytopenia could be chronic ITP.  May also have some aspect of a pseudothrombocytopenia from platelet clumping.  Hepatic steatosis and marginal splenomegaly may be contributing.  Less likely to be bone marrow infiltrative disorder. - PLAN: No treatment needed at this time.  Continue surveillance with RTC in 6 months. - Vitamin B12 deficiency addressed below. - Will check nutritional panel and IPF with next labs.  If platelets drop below 100, consider BMBx.  3.  Vitamin B12 deficiency - Labs from PCP (10/05/2023) showed marginally low vitamin B12 at 279, normal folate 9.5 - He does not take any B12 supplements - PLAN: Recommend starting vitamin B12 supplement 500 mcg daily - Recheck at next visit  4.  Secondary polycythemia - Mild intermittent elevations in hemoglobin and hematocrit - Patient has a history of hypogonadism and has been taking weekly testosterone  shots for the past 10 to 15 years - He has OSA, compliant with CPAP.   - Reports poor water intake, primarily drinking tea and Diet Coke. - May be some contribution from a renal cyst as well - Most recent labs (08/06/2024): Hgb 7.7,  hematocrit 49.9%.  Erythropoietin  mildly elevated 28.2. - He has chronic tinnitus.  Denies any other vasomotor symptoms, severe headaches, or fatigue. - PLAN: No additional workup or treatment of mild erythrocytosis  at this time.  Recommend increased hydration.  Patient can pursue voluntary blood donation if he chooses.    5.  Pancreatic lesion - Hypoechoic mass observed on ultrasound in the pancreas.  Recommended to obtain CT scan. CT scan showed no pancreatic abnormality - PLAN: No further workup needed at this time  PLAN SUMMARY: >> Rx to pharmacy for vitamin B12 500 mcg daily >> Labs in 6 months = CBC/D, CMP, ferritin, iron/TIBC, B12, MMA, copper, immature platelet fraction >> OFFICE visit in 6 months (1 week after labs)     REVIEW OF SYSTEMS:   Review of Systems  Constitutional:  Negative for appetite change, chills, diaphoresis, fatigue, fever and unexpected weight change.  HENT:   Positive for tinnitus. Negative for lump/mass and nosebleeds.   Eyes:  Negative for eye problems.  Respiratory:  Negative for cough, hemoptysis and shortness of breath.   Cardiovascular:  Negative for chest pain, leg swelling and palpitations.  Gastrointestinal:  Negative for abdominal pain, blood in stool, constipation, diarrhea, nausea and vomiting.  Genitourinary:  Negative for hematuria.   Skin: Negative.   Neurological:  Negative for dizziness, headaches and light-headedness.  Hematological:  Does not bruise/bleed easily.    PHYSICAL EXAM:  ECOG PERFORMANCE STATUS: 0 - Asymptomatic  Vitals:   08/13/24 0943  BP: 132/88  Pulse: 73  Resp: 18  Temp: (!) 97.4 F (36.3 C)  SpO2: 97%   Filed Weights   08/13/24 0943  Weight: 252 lb 8 oz (114.5 kg)   Physical Exam Constitutional:      Appearance: Normal appearance. He is obese.  Cardiovascular:     Heart sounds: Normal heart sounds.  Pulmonary:     Breath sounds: Normal breath sounds.  Neurological:     General: No focal deficit present.     Mental Status: Mental status is at baseline.  Psychiatric:        Behavior: Behavior normal. Behavior is cooperative.     PAST MEDICAL/SURGICAL HISTORY:  Past Medical History:  Diagnosis Date   Allergy     Anxiety    Arthritis    Asthma    Cancer (HCC)    Depression    Diabetes mellitus without complication (HCC)    GERD (gastroesophageal reflux disease)    Hyperlipidemia    Hypertension    Sleep apnea    Past Surgical History:  Procedure Laterality Date   CARPAL BONE EXCISION Bilateral    KNEE ARTHROSCOPY Left    KNEE SURGERY Left 1974   lipoma removed     TRIGGER FINGER RELEASE Right     SOCIAL HISTORY:  Social History   Socioeconomic History   Marital status: Legally Separated    Spouse name: Not on file   Number of children: 2   Years of education: Not on file   Highest education level: Master's degree (e.g., MA, MS, MEng, MEd, MSW, MBA)  Occupational History   Occupation: metal scrapping part time    Employer: [employer]  Tobacco Use   Smoking status: Never   Smokeless tobacco: Never  Vaping Use   Vaping status: Never Used  Substance and Sexual Activity   Alcohol use: No   Drug use: No   Sexual activity: Not on file  Other Topics Concern   Not on  file  Social History Narrative   One son lives in [place], other son in [place]   Social Drivers of Health   Tobacco Use: Low Risk (06/05/2024)   Patient History    Smoking Tobacco Use: Never    Smokeless Tobacco Use: Never    Passive Exposure: Not on file  Financial Resource Strain: Low Risk (06/02/2024)   Overall Financial Resource Strain (CARDIA)    Difficulty of Paying Living Expenses: Not hard at all  Food Insecurity: No Food Insecurity (06/02/2024)   Epic    Worried About Programme Researcher, Broadcasting/film/video in the Last Year: Never true    Ran Out of Food in the Last Year: Never true  Transportation Needs: No Transportation Needs (06/02/2024)   Epic    Lack of Transportation (Medical): No    Lack of Transportation (Non-Medical): No  Physical Activity: Insufficiently Active (06/02/2024)   Exercise Vital Sign    Days of Exercise per Week: 4 days    Minutes of Exercise per Session: 20 min   Stress: No Stress Concern Present (06/02/2024)   [employer] of Occupational Health - Occupational Stress Questionnaire    Feeling of Stress: Only a little  Social Connections: Socially Isolated (06/02/2024)   Social Connection and Isolation Panel    Frequency of Communication with Friends and Family: More than three times a week    Frequency of Social Gatherings with Friends and Family: More than three times a week    Attends Religious Services: Never    Database Administrator or Organizations: No    Attends Engineer, Structural: Not on file    Marital Status: Separated  Intimate Partner Violence: Not At Risk (01/15/2024)   Epic    Fear of Current or Ex-Partner: No    Emotionally Abused: No    Physically Abused: No    Sexually Abused: No  Depression (PHQ2-9): Low Risk (08/13/2024)   Depression (PHQ2-9)    PHQ-2 Score: 0  Alcohol Screen: Low Risk (01/15/2024)   Alcohol Screen    Last Alcohol Screening Score (AUDIT): 0  Housing: Low Risk (06/02/2024)   Epic    Unable to Pay for Housing in the Last Year: No    Number of Times Moved in the Last Year: 0    Homeless in the Last Year: No  Utilities: Not At Risk (01/15/2024)   Epic    Threatened with loss of utilities: No  Health Literacy: Adequate Health Literacy (01/15/2024)   B1300 Health Literacy    Frequency of need for help with medical instructions: Never    FAMILY HISTORY:  Family History  Problem Relation Age of Onset   Heart disease Mother    Hypertension Mother    Diabetes Mother    Anxiety disorder Mother    Arthritis Mother    Heart disease Father    Cancer Father        liver   Diabetes Father    Arthritis Father    Asthma Father    Hypertension Sister    Diabetes Sister    Arthritis Sister    Migraines Son    Asthma Paternal Grandmother    Cancer Paternal Grandmother     CURRENT MEDICATIONS:  Outpatient Encounter Medications as of 08/13/2024  Medication Sig   cyanocobalamin  (V-R VITAMIN  B-12) 500 MCG tablet Take 1 tablet (500 mcg total) by mouth daily.   acetaminophen  (TYLENOL ) 500 MG tablet Take 1,000 mg by mouth every 6 (six) hours as needed for  mild pain (pain score 1-3).   aspirin  81 MG tablet Take 81 mg by mouth daily.   B-D 3CC LUER-LOK SYR 22GX1 22G X 1 3 ML MISC USE TO GIVE TESTOSTERONE    EASY TOUCH HYPODERMIC NEEDLE 18G X 1-1/2 MISC USE TO draw UP testosterone    fluticasone  (FLONASE ) 50 MCG/ACT nasal spray Place 1 spray into both nostrils daily.   hydrochlorothiazide  (HYDRODIURIL ) 25 MG tablet Take 1 tablet (25 mg total) by mouth daily.   loratadine  (CLARITIN ) 10 MG tablet Take 10 mg by mouth daily.   losartan  (COZAAR ) 100 MG tablet Take 1 tablet (100 mg total) by mouth daily.   meloxicam  (MOBIC ) 15 MG tablet Take 1 tablet (15 mg total) by mouth daily.   montelukast  (SINGULAIR ) 10 MG tablet Take 1 tablet (10 mg total) by mouth at bedtime.   sertraline  (ZOLOFT ) 100 MG tablet Take 1 tablet (100 mg total) by mouth daily.   sildenafil  (REVATIO ) 20 MG tablet take 1 TO 5 TABLETS BY MOUTH EVERY DAY AS NEEDED   simvastatin  (ZOCOR ) 40 MG tablet Take 1 tablet (40 mg total) by mouth daily.   Syringe/Needle, Disp, 18G X 1 3 ML MISC Use as directed Use to draw up testosterone    testosterone  cypionate (DEPOTESTOSTERONE CYPIONATE) 200 MG/ML injection Inject 1/2 mL IM every week   No facility-administered encounter medications on file as of 08/13/2024.    ALLERGIES:  Allergies[1]  LABORATORY DATA:  I have reviewed the labs as listed.  CBC    Component Value Date/Time   WBC 6.0 08/06/2024 0933   RBC 5.79 08/06/2024 0933   HGB 17.7 (H) 08/06/2024 0933   HGB 16.8 02/18/2024 0825   HCT 49.9 08/06/2024 0933   HCT 51.2 (H) 02/18/2024 0825   PLT 116 (L) 08/06/2024 0933   PLT 114 (L) 02/18/2024 0825   MCV 86.2 08/06/2024 0933   MCV 90 02/18/2024 0825   MCH 30.6 08/06/2024 0933   MCHC 35.5 08/06/2024 0933   RDW 12.8 08/06/2024 0933   RDW 13.1 02/18/2024 0825    LYMPHSABS 1.1 08/06/2024 0933   LYMPHSABS 1.0 02/18/2024 0825   MONOABS 0.5 08/06/2024 0933   EOSABS 0.1 08/06/2024 0933   EOSABS 0.1 02/18/2024 0825   BASOSABS 0.1 08/06/2024 0933   BASOSABS 0.1 02/18/2024 0825      Latest Ref Rng & Units 08/06/2024    9:33 AM 02/18/2024    8:25 AM 02/05/2024    9:00 AM  CMP  Glucose 70 - 99 mg/dL 815  826    BUN 8 - 23 mg/dL 12  11    Creatinine 9.38 - 1.24 mg/dL 9.22  9.16  9.19   Sodium 135 - 145 mmol/L 137  137    Potassium 3.5 - 5.1 mmol/L 4.1  4.0    Chloride 98 - 111 mmol/L 100  100    CO2 22 - 32 mmol/L 23  22    Calcium 8.9 - 10.3 mg/dL 9.2  9.4    Total Protein 6.5 - 8.1 g/dL 6.7  5.7    Total Bilirubin 0.0 - 1.2 mg/dL 0.9  0.5    Alkaline Phos 38 - 126 U/L 89  87    AST 15 - 41 U/L 19  15    ALT 0 - 44 U/L 22  17      DIAGNOSTIC IMAGING:  I have independently reviewed the relevant imaging and discussed with the patient.   WRAP UP:  All questions were answered. The patient knows to  call the clinic with any problems, questions or concerns.  Medical decision making: Moderate  Time spent on visit: I spent 20 minutes counseling the patient face to face. The total time spent in the appointment was 30 minutes and more than 50% was on counseling.  Pleasant CHRISTELLA Barefoot, PA-C  08/13/2024 10:11 AM      [1]  Allergies Allergen Reactions   Lisinopril    Erythromycin Rash   "

## 2024-08-14 ENCOUNTER — Telehealth: Payer: Self-pay | Admitting: Family Medicine

## 2024-08-14 NOTE — Telephone Encounter (Unsigned)
 Copied from CRM [id number]. Topic: Clinical - Prescription Issue >> Aug 14, 2024  3:26 PM Debby BROCKS wrote: Reason for CRM: Delon Endo from [facility] called in asking to speak to [facility] regarding t prescription that was sent for the patients in home sleep test. States there is an issue that does not meet the insurance's criteria for it Callback: [phone]

## 2024-08-15 NOTE — Telephone Encounter (Signed)
 TCB to [facility] w/ [facility] informed her that Fax received yesterday was just faxed back, PCP signed  test order confirmation form.

## 2024-09-10 ENCOUNTER — Ambulatory Visit: Admitting: Family Medicine

## 2025-01-15 ENCOUNTER — Ambulatory Visit: Payer: Self-pay

## 2025-02-10 ENCOUNTER — Inpatient Hospital Stay

## 2025-02-17 ENCOUNTER — Ambulatory Visit

## 2025-02-17 ENCOUNTER — Inpatient Hospital Stay: Admitting: Physician Assistant

## 2025-06-03 ENCOUNTER — Ambulatory Visit: Admitting: Urology
